# Patient Record
Sex: Female | Born: 1970 | Race: White | Hispanic: Yes | Marital: Married | Smoking: Current every day smoker
Health system: Southern US, Community
[De-identification: ages and names within clinical notes are randomized; demographics above are authoritative.]

## PROBLEM LIST (undated history)

## (undated) DIAGNOSIS — F39 Unspecified mood [affective] disorder: Secondary | ICD-10-CM

## (undated) DIAGNOSIS — D6800 Von Willebrand disease, unspecified: Secondary | ICD-10-CM

## (undated) DIAGNOSIS — F32A Depression, unspecified: Secondary | ICD-10-CM

## (undated) DIAGNOSIS — R112 Nausea with vomiting, unspecified: Secondary | ICD-10-CM

## (undated) DIAGNOSIS — D649 Anemia, unspecified: Secondary | ICD-10-CM

## (undated) DIAGNOSIS — F329 Major depressive disorder, single episode, unspecified: Secondary | ICD-10-CM

## (undated) DIAGNOSIS — Z9889 Other specified postprocedural states: Secondary | ICD-10-CM

## (undated) DIAGNOSIS — N920 Excessive and frequent menstruation with regular cycle: Secondary | ICD-10-CM

## (undated) HISTORY — PX: HYSTEROSCOPY WITH D & C: SHX1775

## (undated) HISTORY — PX: MULTIPLE TOOTH EXTRACTIONS: SHX2053

---

## 2000-01-22 HISTORY — PX: DILATION AND CURETTAGE OF UTERUS: SHX78

## 2004-01-22 HISTORY — PX: APPENDECTOMY: SHX54

## 2012-12-19 ENCOUNTER — Ambulatory Visit (INDEPENDENT_AMBULATORY_CARE_PROVIDER_SITE_OTHER): Payer: BC Managed Care – PPO | Admitting: Family Medicine

## 2012-12-19 VITALS — BP 118/72 | HR 90 | Temp 99.4°F | Resp 18 | Ht 62.5 in | Wt 176.0 lb

## 2012-12-19 DIAGNOSIS — R319 Hematuria, unspecified: Secondary | ICD-10-CM

## 2012-12-19 DIAGNOSIS — R350 Frequency of micturition: Secondary | ICD-10-CM

## 2012-12-19 DIAGNOSIS — N3090 Cystitis, unspecified without hematuria: Secondary | ICD-10-CM

## 2012-12-19 DIAGNOSIS — R300 Dysuria: Secondary | ICD-10-CM

## 2012-12-19 DIAGNOSIS — J329 Chronic sinusitis, unspecified: Secondary | ICD-10-CM

## 2012-12-19 LAB — POCT URINALYSIS DIPSTICK
Bilirubin, UA: NEGATIVE
Ketones, UA: NEGATIVE
Nitrite, UA: NEGATIVE
Spec Grav, UA: 1.015
Urobilinogen, UA: 0.2
pH, UA: 8.5

## 2012-12-19 LAB — POCT UA - MICROSCOPIC ONLY
Casts, Ur, LPF, POC: NEGATIVE
Crystals, Ur, HPF, POC: NEGATIVE
Yeast, UA: NEGATIVE

## 2012-12-19 MED ORDER — SULFAMETHOXAZOLE-TMP DS 800-160 MG PO TABS: 1.0000 | ORAL_TABLET | Freq: Two times a day (BID) | ORAL | Status: DC

## 2012-12-19 MED ORDER — PHENAZOPYRIDINE HCL 200 MG PO TABS: 200.0000 mg | ORAL_TABLET | Freq: Three times a day (TID) | ORAL | Status: DC | PRN

## 2012-12-19 NOTE — Patient Instructions (Signed)
Drink plenty of fluids  Take the Pyridium one pill 3 times a day as needed for bladder pain. Usually one to 2 days is sufficient  Take the sulfa one pill twice daily with food  Use the Flonase (fluticasone nose spray) that you have at home, 2 sprays each nostril twice daily for 2 days, then once daily  Take over-the-counter pain relievers as needed  Return if worse

## 2012-12-19 NOTE — Progress Notes (Signed)
Subjective:     Results for orders placed in visit on 12/19/12  POCT URINALYSIS DIPSTICK      Result Value Range   Color, UA yellowish pink     Clarity, UA cloucy     Glucose, UA neg     Bilirubin, UA neg     Ketones, UA neg     Spec Grav, UA 1.015     Blood, UA large     pH, UA 8.5     Protein, UA trace     Urobilinogen, UA 0.2     Nitrite, UA neg     Leukocytes, UA moderate (2+)    POCT UA - MICROSCOPIC ONLY      Result Value Range   WBC, Ur, HPF, POC 4-8 with large clusters     RBC, urine, microscopic tntc     Bacteria, U Microscopic neg     Mucus, UA neg     Epithelial cells, urine per micros 0-1     Crystals, Ur, HPF, POC neg     Casts, Ur, LPF, POC neg     Yeast, UA neg

## 2013-12-11 ENCOUNTER — Ambulatory Visit (INDEPENDENT_AMBULATORY_CARE_PROVIDER_SITE_OTHER): Payer: BC Managed Care – PPO | Admitting: Family Medicine

## 2013-12-11 VITALS — BP 124/84 | HR 86 | Temp 98.2°F | Resp 16 | Ht 62.25 in | Wt 168.0 lb

## 2013-12-11 DIAGNOSIS — Z862 Personal history of diseases of the blood and blood-forming organs and certain disorders involving the immune mechanism: Secondary | ICD-10-CM

## 2013-12-11 DIAGNOSIS — N946 Dysmenorrhea, unspecified: Secondary | ICD-10-CM

## 2013-12-11 DIAGNOSIS — R1032 Left lower quadrant pain: Secondary | ICD-10-CM

## 2013-12-11 DIAGNOSIS — R350 Frequency of micturition: Secondary | ICD-10-CM

## 2013-12-11 LAB — POCT UA - MICROSCOPIC ONLY
CRYSTALS, UR, HPF, POC: NEGATIVE
Casts, Ur, LPF, POC: NEGATIVE
Mucus, UA: NEGATIVE
Yeast, UA: NEGATIVE

## 2013-12-11 LAB — POCT CBC
Granulocyte percent: 56.7 %G (ref 37–80)
HCT, POC: 37.5 % — AB (ref 37.7–47.9)
Hemoglobin: 12.3 g/dL (ref 12.2–16.2)
LYMPH, POC: 2.6 (ref 0.6–3.4)
MCH, POC: 29.6 pg (ref 27–31.2)
MCHC: 32.8 g/dL (ref 31.8–35.4)
MCV: 90.3 fL (ref 80–97)
MID (CBC): 0.5 (ref 0–0.9)
MPV: 6.9 fL (ref 0–99.8)
PLATELET COUNT, POC: 320 10*3/uL (ref 142–424)
POC GRANULOCYTE: 4.1 (ref 2–6.9)
POC LYMPH PERCENT: 36.6 %L (ref 10–50)
POC MID %: 6.7 % (ref 0–12)
RBC: 4.15 M/uL (ref 4.04–5.48)
RDW, POC: 13.3 %
WBC: 7.2 10*3/uL (ref 4.6–10.2)

## 2013-12-11 LAB — POCT URINALYSIS DIPSTICK
BILIRUBIN UA: NEGATIVE
Glucose, UA: NEGATIVE
Ketones, UA: NEGATIVE
Leukocytes, UA: NEGATIVE
NITRITE UA: NEGATIVE
Protein, UA: NEGATIVE
RBC UA: NEGATIVE
Spec Grav, UA: 1.01
Urobilinogen, UA: 0.2
pH, UA: 7.5

## 2013-12-11 NOTE — Patient Instructions (Addendum)

## 2013-12-11 NOTE — Progress Notes (Signed)
Subjective: 43 year old lady who has been having problems since her last menstrual cycle. This was a couple of weeks ago. She had 2 days of severe pain with it, had to stay late out. She felt generally systemically lousy. She has been having some urinary frequency and dysuria. She has not had any dyspareunia. She has not had any problems with her bowels, no history of constipation. She hurts in her left lower quadrant of the abdomen and pelvis. She has a slight area of most pain just in the left pelvic area. She is not running fevers. The pain is not constant but comes and goes even since the menstrual cycle. Her menstrual cycles of gotten a little closer together over the years, used to be 4 weeks and are now down to about 3 weeks. She's been told that she was premenopausal. In [place] she was given some estrogen to take for a little while, but she did not contain a very long. She has had an appendectomy. She has no fear of STDs, with a long stable marriage.  History of von willebrands  Objective: No acute distress. Abdomen has normal bowel sounds. Soft without masses. Mild left lower quadrant tenderness. Pelvic normal external genitalia. Vaginal cuff is unremarkable. Cervix appears normal. No discharge. Bimanual exam reveals the uterus to be a little bit retroflexed. She has minimal tenderness in the left lower abdomen but I could not appreciate any masses   Results for orders placed or performed in visit on 12/11/13  POCT urinalysis dipstick  Result Value Ref Range   Color, UA yellow    Clarity, UA clear    Glucose, UA neg    Bilirubin, UA neg    Ketones, UA neg    Spec Grav, UA 1.010    Blood, UA neg    pH, UA 7.5    Protein, UA neg    Urobilinogen, UA 0.2    Nitrite, UA neg    Leukocytes, UA Negative   POCT UA - Microscopic Only  Result Value Ref Range   WBC, Ur, HPF, POC 0-2    RBC, urine, microscopic 0-1    Bacteria, U Microscopic trace    Mucus, UA neg    Epithelial cells, urine  per micros 0-2    Crystals, Ur, HPF, POC neg    Casts, Ur, LPF, POC neg    Yeast, UA neg   POCT CBC  Result Value Ref Range   WBC 7.2 4.6 - 10.2 K/uL   Lymph, poc 2.6 0.6 - 3.4   POC LYMPH PERCENT 36.6 10 - 50 %L   MID (cbc) 0.5 0 - 0.9   POC MID % 6.7 0 - 12 %M   POC Granulocyte 4.1 2 - 6.9   Granulocyte percent 56.7 37 - 80 %G   RBC 4.15 4.04 - 5.48 M/uL   Hemoglobin 12.3 12.2 - 16.2 g/dL   HCT, POC 37.5 (A) 37.7 - 47.9 %   MCV 90.3 80 - 97 fL   MCH, POC 29.6 27 - 31.2 pg   MCHC 32.8 31.8 - 35.4 g/dL   RDW, POC 13.3 %   Platelet Count, POC 320 142 - 424 K/uL   MPV 6.9 0 - 99.8 fL   Assessment: Left lower quadrant pain Dysmenorrhea  Plan: Schedule her for a pelvic ultrasound, hopefully Monday.

## 2013-12-13 ENCOUNTER — Telehealth: Payer: Self-pay | Admitting: *Deleted

## 2013-12-13 ENCOUNTER — Inpatient Hospital Stay: Admission: RE | Admit: 2013-12-13 | Payer: Self-pay | Source: Ambulatory Visit

## 2013-12-13 ENCOUNTER — Other Ambulatory Visit: Payer: Self-pay

## 2013-12-13 DIAGNOSIS — N946 Dysmenorrhea, unspecified: Secondary | ICD-10-CM

## 2013-12-13 DIAGNOSIS — N920 Excessive and frequent menstruation with regular cycle: Secondary | ICD-10-CM

## 2013-12-13 NOTE — Telephone Encounter (Signed)
[facility] called and needed the order to be added for US pelvic limited and a Transvaginal US to look at the bladder.

## 2014-01-07 ENCOUNTER — Other Ambulatory Visit: Payer: Self-pay

## 2014-01-11 ENCOUNTER — Encounter: Payer: Self-pay | Admitting: Family Medicine

## 2014-02-27 ENCOUNTER — Ambulatory Visit (INDEPENDENT_AMBULATORY_CARE_PROVIDER_SITE_OTHER): Payer: BLUE CROSS/BLUE SHIELD | Admitting: Physician Assistant

## 2014-02-27 VITALS — BP 116/76 | HR 85 | Temp 98.0°F | Resp 16 | Ht 62.25 in | Wt 171.0 lb

## 2014-02-27 DIAGNOSIS — J0100 Acute maxillary sinusitis, unspecified: Secondary | ICD-10-CM | POA: Insufficient documentation

## 2014-02-27 DIAGNOSIS — J309 Allergic rhinitis, unspecified: Secondary | ICD-10-CM

## 2014-02-27 MED ORDER — IPRATROPIUM BROMIDE 0.03 % NA SOLN: 2.0000 | Freq: Two times a day (BID) | NASAL | Status: DC

## 2014-02-27 MED ORDER — AMOXICILLIN 875 MG PO TABS: 875.0000 mg | ORAL_TABLET | Freq: Two times a day (BID) | ORAL | Status: DC

## 2014-02-27 MED ORDER — FLUTICASONE PROPIONATE 50 MCG/ACT NA SUSP: 2.0000 | Freq: Every day | NASAL | Status: DC

## 2014-02-27 NOTE — Progress Notes (Signed)
Subjective:    Patient ID: Cheryl Tate, female    DOB: Nov 20, 1970, 44 y.o.   MRN: [id number]  HPI  This is a 44 year old female presenting with sinus problems x 1 month. She reports she has had problems with her allergies over the past month. She has had congestion and her eyes have been watery and itchy. Last week she thought she was getting conjunctivitis but realized it was allergies when antihistamine drops cleared it up. Over the past 2 days she has started getting sinus pain and teeth pain. When she bends forward she feels pressure in her face. She is endorsing bilateral ear fullness. She has a dry cough. She is having yellow drainage from her nose, especially in the mornings. She has tried saline nasal spray without much relief. She states in the past flonase has worked really well for her allergic symptoms although she has not had flonase in over a year. She denies sore throat, fever or chills. She does not have a history of asthma. She smokes 1 cigarette per day (pt is from [place] and she states this is an [place] habit).  Review of Systems  Constitutional: Negative for fever and chills.  HENT: Positive for congestion and sinus pressure. Negative for ear pain and sore throat.   Eyes: Negative for redness.  Respiratory: Positive for cough. Negative for shortness of breath and wheezing.   Gastrointestinal: Negative for nausea and vomiting.  Skin: Negative for rash.  Allergic/Immunologic: Positive for environmental allergies.  Hematological: Negative for adenopathy.    There are no active problems to display for this patient.  Prior to Admission medications   Medication Sig Start Date End Date Taking? Authorizing Provider  fluticasone (FLONASE) 50 MCG/ACT nasal spray Place 2 sprays into both nostrils daily.   Yes Historical Provider, MD  valproic acid (DEPAKENE) 250 MG capsule Take 250 mg by mouth Nightly.   Yes Historical Provider, MD   No Known Allergies  Patient's social  and family history were reviewed.     Objective:   Physical Exam  Constitutional: She is oriented to person, place, and time. She appears well-developed and well-nourished. No distress.  HENT:  Head: Normocephalic and atraumatic.  Right Ear: Hearing, external ear and ear canal normal. Tympanic membrane is retracted.  Left Ear: Hearing, external ear and ear canal normal. Tympanic membrane is retracted.  Nose: Mucosal edema present. Right sinus exhibits maxillary sinus tenderness. Right sinus exhibits no frontal sinus tenderness. Left sinus exhibits maxillary sinus tenderness. Left sinus exhibits no frontal sinus tenderness.  Mouth/Throat: Uvula is midline and mucous membranes are normal. Posterior oropharyngeal erythema present. No oropharyngeal exudate or posterior oropharyngeal edema.  Eyes: Conjunctivae and lids are normal. Right eye exhibits no discharge. Left eye exhibits no discharge. No scleral icterus.  Cardiovascular: Normal rate, regular rhythm, normal heart sounds, intact distal pulses and normal pulses.   No murmur heard. Pulmonary/Chest: Effort normal and breath sounds normal. No respiratory distress. She has no wheezes. She has no rhonchi. She has no rales.  Musculoskeletal: Normal range of motion.  Lymphadenopathy:       Head (right side): No submental, no submandibular, no tonsillar, no preauricular and no posterior auricular adenopathy present.       Head (left side): No submental, no submandibular, no tonsillar, no preauricular and no posterior auricular adenopathy present.    She has cervical adenopathy.       Right cervical: No posterior cervical adenopathy present.      Left  cervical: Posterior cervical adenopathy present.  Neurological: She is alert and oriented to person, place, and time.  Skin: Skin is warm, dry and intact. No lesion and no rash noted.  Psychiatric: She has a normal mood and affect. Her speech is normal and behavior is normal. Thought content normal.     BP 116/76 mmHg  Pulse 85  Temp(Src) 98 F (36.7 C) (Oral)  Resp 16  Ht 5' 2.25" (1.581 m)  Wt 171 lb (77.565 kg)  BMI 31.03 kg/m2  SpO2 98%  LMP 02/08/2014     Assessment & Plan:  1. Allergic rhinitis, unspecified allergic rhinitis type 2. Acute maxillary sinusitis Will treat acute sinusitis with atrovent nasal spray and amoxicillin. In 2 weeks pt will stop atrovent and start flonase daily for allergic symptoms. She will return in 7-10 days if symptoms are not improving.  - ipratropium (ATROVENT) 0.03 % nasal spray; Place 2 sprays into both nostrils 2 (two) times daily.  Dispense: 30 mL; Refill: 0 - fluticasone (FLONASE) 50 MCG/ACT nasal spray; Place 2 sprays into both nostrils daily.  Dispense: 16 g; Refill: 11 - amoxicillin (AMOXIL) 875 MG tablet; Take 1 tablet (875 mg total) by mouth 2 (two) times daily.  Dispense: 20 tablet; Refill: 0   Benjaman Pott. Drenda Freeze, MHS [facility]  02/27/2014

## 2014-02-27 NOTE — Patient Instructions (Signed)
Use atrovent nasal spray twice a day for two weeks, then start using flonase nasal spray daily for allergies Take antibiotic until finished. Return if not getting better in 7-10 days.

## 2014-04-14 ENCOUNTER — Encounter: Payer: Self-pay | Admitting: Family Medicine

## 2014-06-11 ENCOUNTER — Ambulatory Visit (INDEPENDENT_AMBULATORY_CARE_PROVIDER_SITE_OTHER): Payer: BLUE CROSS/BLUE SHIELD | Admitting: Family Medicine

## 2014-06-11 VITALS — BP 118/72 | HR 84 | Temp 98.8°F | Resp 16 | Ht 64.0 in | Wt 169.0 lb

## 2014-06-11 DIAGNOSIS — X509XXA Other and unspecified overexertion or strenuous movements or postures, initial encounter: Secondary | ICD-10-CM

## 2014-06-11 DIAGNOSIS — N951 Menopausal and female climacteric states: Secondary | ICD-10-CM

## 2014-06-11 DIAGNOSIS — R42 Dizziness and giddiness: Secondary | ICD-10-CM

## 2014-06-11 DIAGNOSIS — T733XXA Exhaustion due to excessive exertion, initial encounter: Secondary | ICD-10-CM

## 2014-06-11 DIAGNOSIS — R5383 Other fatigue: Secondary | ICD-10-CM

## 2014-06-11 DIAGNOSIS — R51 Headache: Secondary | ICD-10-CM

## 2014-06-11 DIAGNOSIS — Z862 Personal history of diseases of the blood and blood-forming organs and certain disorders involving the immune mechanism: Secondary | ICD-10-CM | POA: Diagnosis not present

## 2014-06-11 DIAGNOSIS — Z8639 Personal history of other endocrine, nutritional and metabolic disease: Secondary | ICD-10-CM

## 2014-06-11 DIAGNOSIS — R519 Headache, unspecified: Secondary | ICD-10-CM

## 2014-06-11 LAB — POCT UA - MICROSCOPIC ONLY
Bacteria, U Microscopic: NEGATIVE
CASTS, UR, LPF, POC: NEGATIVE
Crystals, Ur, HPF, POC: NEGATIVE
MUCUS UA: NEGATIVE
Yeast, UA: NEGATIVE

## 2014-06-11 LAB — POCT URINALYSIS DIPSTICK
BILIRUBIN UA: NEGATIVE
GLUCOSE UA: NEGATIVE
KETONES UA: NEGATIVE
Leukocytes, UA: NEGATIVE
Nitrite, UA: NEGATIVE
Protein, UA: NEGATIVE
SPEC GRAV UA: 1.015
Urobilinogen, UA: 0.2
pH, UA: 8.5

## 2014-06-11 LAB — POCT CBC
GRANULOCYTE PERCENT: 59.6 % (ref 37–80)
HEMATOCRIT: 40.6 % (ref 37.7–47.9)
HEMOGLOBIN: 12.6 g/dL (ref 12.2–16.2)
Lymph, poc: 2 (ref 0.6–3.4)
MCH: 28.1 pg (ref 27–31.2)
MCHC: 31 g/dL — AB (ref 31.8–35.4)
MCV: 90.7 fL (ref 80–97)
MID (cbc): 0.3 (ref 0–0.9)
MPV: 7.5 fL (ref 0–99.8)
POC Granulocyte: 3.5 (ref 2–6.9)
POC LYMPH PERCENT: 34.7 %L (ref 10–50)
POC MID %: 5.7 %M (ref 0–12)
Platelet Count, POC: 305 10*3/uL (ref 142–424)
RBC: 4.47 M/uL (ref 4.04–5.48)
RDW, POC: 14.2 %
WBC: 5.8 10*3/uL (ref 4.6–10.2)

## 2014-06-11 LAB — POCT GLYCOSYLATED HEMOGLOBIN (HGB A1C): HEMOGLOBIN A1C: 5.1

## 2014-06-11 LAB — TSH: TSH: 1.289 u[IU]/mL (ref 0.350–4.500)

## 2014-06-11 NOTE — Progress Notes (Addendum)
Subjective: 44 year old lady who's been having problems with excessive fatigue over the past 3 weeks. Although she is busy and stressed, she usually is someone who can be on the go fine without any issues. She has a long history of episodes of iron deficiency anemia, and has been given IV aren't a number of times in [place]. She spends a lot of time in [place] and here. June 24 she is returning to [place] for several weeks, and has an appointment with a gynecologist here on June 23. She has not been getting a lot of regular exercise because she feels too fatigued. She has some headaches and some lightheaded dizziness. No major chest pains or excessive shortness of breath but just feels no energy. Nonspecific abdominal pains. She has a lot of premenstrual pain in her low back and lower abdomen. She feels like menstrual cycle is getting ready to come on again now. She finished a 9 day cycle recently, with normal being 4 days. She does not smoke or drink or use drugs. She is fairly content in her life. Has not had major anxiety and depression issues.  Objective: Pleasant lady, alert and oriented in no major distress. Her TMs are normal. Eyes PERRLA. Throat clear. Neck supple without nodes or thyromegaly. No carotid bruits. Chest is clear to auscultation. Heart regular without murmurs gallops or arrhythmias. Abdomen is soft without organomegaly or masses. Mild suprapubic tenderness but she needs to urinate. Extremities unremarkable.  Assessment: Allergy undetermined Dizziness History of iron deficiency anemia  Plan: Check basic labs and proceed from there  Results for orders placed or performed in visit on 06/11/14  POCT CBC  Result Value Ref Range   WBC 5.8 4.6 - 10.2 K/uL   Lymph, poc 2.0 0.6 - 3.4   POC LYMPH PERCENT 34.7 10 - 50 %L   MID (cbc) 0.3 0 - 0.9   POC MID % 5.7 0 - 12 %M   POC Granulocyte 3.5 2 - 6.9   Granulocyte percent 59.6 37 - 80 %G   RBC 4.47 4.04 - 5.48 M/uL   Hemoglobin 12.6 12.2  - 16.2 g/dL   HCT, POC 40.6 37.7 - 47.9 %   MCV 90.7 80 - 97 fL   MCH, POC 28.1 27 - 31.2 pg   MCHC 31.0 (A) 31.8 - 35.4 g/dL   RDW, POC 14.2 %   Platelet Count, POC 305 142 - 424 K/uL   MPV 7.5 0 - 99.8 fL  POCT urinalysis dipstick  Result Value Ref Range   Color, UA lt yellow    Clarity, UA cloudy    Glucose, UA neg    Bilirubin, UA neg    Ketones, UA neg    Spec Grav, UA 1.015    Blood, UA mod    pH, UA 8.5    Protein, UA neg    Urobilinogen, UA 0.2    Nitrite, UA neg    Leukocytes, UA Negative   POCT UA - Microscopic Only  Result Value Ref Range   WBC, Ur, HPF, POC 0-3    RBC, urine, microscopic 22-27    Bacteria, U Microscopic neg    Mucus, UA neg    Epithelial cells, urine per micros 0-4    Crystals, Ur, HPF, POC neg    Casts, Ur, LPF, POC neg    Yeast, UA neg   POCT glycosylated hemoglobin (Hb A1C)  Result Value Ref Range   Hemoglobin A1C 5.1     Nothing apparent turns  up. Will check an EKG. Advised patient to follow through with the gynecologic visit. I will see her back on an as-needed basis. She does not believe this is anxiety and depression related, but if she is not doing better I would consider trying an antidepressant on her. Advised pushing on with some regular physical activity despite not feeling well.  EKG normal  Patient requests that we send reports to Dr. cousins, her OB/GYN if we we remember to do so.

## 2014-06-11 NOTE — Patient Instructions (Signed)
Try to push her self to resume more regular exercise  I will let you know the results of your additional labs in a few days  See the gynecologist as planned. Call the office and see whether he take can offer you an earlier appointment if they have a cancellation.  If you continue to not feel stronger return for recheck either before or after going to [place]. Return at any time if something abruptly changes.  At some point we might consider giving you an antidepressant if nothing else is turning up. Emotional issues that are not apparent to somebody can be the cause of persistent fatigue.

## 2014-06-14 LAB — COMPLETE METABOLIC PANEL WITH GFR
ALT: 14 U/L (ref 0–35)
AST: 14 U/L (ref 0–37)
Albumin: 5 g/dL (ref 3.5–5.2)
Alkaline Phosphatase: 35 U/L — ABNORMAL LOW (ref 39–117)
BUN: 11 mg/dL (ref 6–23)
CALCIUM: 10.2 mg/dL (ref 8.4–10.5)
CHLORIDE: 100 meq/L (ref 96–112)
CO2: 23 mEq/L (ref 19–32)
CREATININE: 0.5 mg/dL (ref 0.50–1.10)
GFR, Est African American: >89 mL/min
GFR, Est Non African American: >89 mL/min
Glucose, Bld: 92 mg/dL (ref 70–99)
Potassium: 4.4 mEq/L (ref 3.5–5.3)
Sodium: 136 mEq/L (ref 135–145)
Total Bilirubin: 0.4 mg/dL (ref 0.2–1.2)
Total Protein: 7.7 g/dL (ref 6.0–8.3)

## 2014-06-14 LAB — IBC PANEL
%SAT: 29 % (ref 20–55)
TIBC: 396 ug/dL (ref 250–470)
UIBC: 283 ug/dL (ref 125–400)

## 2014-06-14 LAB — IRON: Iron: 113 ug/dL (ref 42–145)

## 2014-08-21 ENCOUNTER — Other Ambulatory Visit: Payer: Self-pay | Admitting: Family Medicine

## 2014-08-21 MED ORDER — OSELTAMIVIR PHOSPHATE 75 MG PO CAPS: 75.0000 mg | ORAL_CAPSULE | Freq: Every day | ORAL | Status: DC

## 2014-08-21 NOTE — Progress Notes (Signed)
She was recently had [place] She has been exposed to Flu A by husband

## 2014-08-29 ENCOUNTER — Ambulatory Visit (INDEPENDENT_AMBULATORY_CARE_PROVIDER_SITE_OTHER): Payer: BLUE CROSS/BLUE SHIELD | Admitting: Family Medicine

## 2014-08-29 VITALS — BP 108/62 | HR 80 | Temp 98.3°F | Resp 14 | Ht 63.0 in | Wt 167.0 lb

## 2014-08-29 DIAGNOSIS — R197 Diarrhea, unspecified: Secondary | ICD-10-CM

## 2014-08-29 DIAGNOSIS — R1032 Left lower quadrant pain: Secondary | ICD-10-CM

## 2014-08-29 DIAGNOSIS — R112 Nausea with vomiting, unspecified: Secondary | ICD-10-CM

## 2014-08-29 LAB — POCT CBC
Granulocyte percent: 61.8 %G (ref 37–80)
HCT, POC: 39.6 % (ref 37.7–47.9)
Hemoglobin: 12.5 g/dL (ref 12.2–16.2)
LYMPH, POC: 2.8 (ref 0.6–3.4)
MCH, POC: 28.1 pg (ref 27–31.2)
MCHC: 31.6 g/dL — AB (ref 31.8–35.4)
MCV: 89.1 fL (ref 80–97)
MID (cbc): 0.4 (ref 0–0.9)
MPV: 7.4 fL (ref 0–99.8)
POC Granulocyte: 5.3 (ref 2–6.9)
POC LYMPH %: 33.1 % (ref 10–50)
POC MID %: 5.1 %M (ref 0–12)
Platelet Count, POC: 368 10*3/uL (ref 142–424)
RBC: 4.45 M/uL (ref 4.04–5.48)
RDW, POC: 14.4 %
WBC: 8.6 10*3/uL (ref 4.6–10.2)

## 2014-08-29 LAB — POCT URINALYSIS DIPSTICK
Bilirubin, UA: NEGATIVE
Glucose, UA: NEGATIVE
KETONES UA: NEGATIVE
NITRITE UA: NEGATIVE
PH UA: 7
Protein, UA: NEGATIVE
SPEC GRAV UA: 1.015
Urobilinogen, UA: 0.2

## 2014-08-29 LAB — POCT UA - MICROSCOPIC ONLY
Bacteria, U Microscopic: NEGATIVE
Casts, Ur, LPF, POC: NEGATIVE
Crystals, Ur, HPF, POC: NEGATIVE
Mucus, UA: NEGATIVE
Yeast, UA: NEGATIVE

## 2014-08-29 NOTE — Progress Notes (Signed)
  Subjective:  Patient ID: Cheryl Tate, female    DOB: [birth year]  Age: 44 y.o. MRN: [id number]  44 y.o. lady who is been here in the past. She just came back from their 44 in [place]. She had been having problems with menorrhagia and had a transvaginal ultrasound and hysteroscopy call over there. Everything went well. She came back to [place]. She did have her menstrual cycle. She developed abdominal pain in the left lower quadrant about last Thursday which would be 4 days ago. Had nausea but only a tiny bit of vomiting. She has had loose bowel movements, one time watery yellow. These were only been once or twice a day. The pain has been pretty consistently in the left lower quadrant. No urinary symptoms. Has felt like she might be febrile but has not had any documented fevers.   Objective:   No major distress. Abdomen has soft bowel sounds with no tinkling or rushing. Not distended. Soft without organomegaly or masses. Mild tenderness actually just to the right of midline right now, though she says it usually is more to the left that where the tenderness occurs. She has a full bladder right now.  Assessment & Plan:   Assessment:  Left lower quadrant abdominal pain Diarrhea Nausea  Plan:  CBC and urinalysis  Results for orders placed or performed in visit on 08/29/14  POCT CBC  Result Value Ref Range   WBC 8.6 4.6 - 10.2 K/uL   Lymph, poc 2.8 0.6 - 3.4   POC LYMPH PERCENT 33.1 10 - 50 %L   MID (cbc) 0.4 0 - 0.9   POC MID % 5.1 0 - 12 %M   POC Granulocyte 5.3 2 - 6.9   Granulocyte percent 61.8 37 - 80 %G   RBC 4.45 4.04 - 5.48 M/uL   Hemoglobin 12.5 12.2 - 16.2 g/dL   HCT, POC 39.6 37.7 - 47.9 %   MCV 89.1 80 - 97 fL   MCH, POC 28.1 27 - 31.2 pg   MCHC 31.6 (A) 31.8 - 35.4 g/dL   RDW, POC 14.4 %   Platelet Count, POC 368 142 - 424 K/uL   MPV 7.4 0 - 99.8 fL  POCT UA - Microscopic Only  Result Value Ref Range   WBC, Ur, HPF, POC 2-6    RBC, urine, microscopic  0-1    Bacteria, U Microscopic neg    Mucus, UA neg    Epithelial cells, urine per micros 6-10    Crystals, Ur, HPF, POC neg    Casts, Ur, LPF, POC neg    Yeast, UA neg   POCT urinalysis dipstick  Result Value Ref Range   Color, UA yellow    Clarity, UA clear    Glucose, UA neg    Bilirubin, UA neg    Ketones, UA neg    Spec Grav, UA 1.015    Blood, UA small    pH, UA 7.0    Protein, UA neg    Urobilinogen, UA 0.2    Nitrite, UA neg    Leukocytes, UA large (3+) (A) Negative     There are no Patient Instructions on file for this visit.   HOPPER,DAVID, MD 08/29/2014

## 2014-08-29 NOTE — Patient Instructions (Signed)
Avoid extremely rich or spicy foods for the next few days  Take Tylenol 500 mg 2 pills 3 times daily maximum for pain  If the bowels continue loose try taking some Pepto-Bismol or Imodium  If running fever, developing more significant abdominal pain, or unable to keep things down please return. If not improving over the next few days we may need to recheck your anyhow.

## 2014-10-06 ENCOUNTER — Ambulatory Visit (INDEPENDENT_AMBULATORY_CARE_PROVIDER_SITE_OTHER): Payer: BLUE CROSS/BLUE SHIELD | Admitting: Family Medicine

## 2014-10-06 VITALS — BP 116/72 | HR 79 | Temp 99.5°F | Resp 18 | Ht 63.0 in | Wt 166.2 lb

## 2014-10-06 DIAGNOSIS — R5383 Other fatigue: Secondary | ICD-10-CM | POA: Diagnosis not present

## 2014-10-06 DIAGNOSIS — D649 Anemia, unspecified: Secondary | ICD-10-CM | POA: Diagnosis not present

## 2014-10-06 DIAGNOSIS — R1084 Generalized abdominal pain: Secondary | ICD-10-CM

## 2014-10-06 DIAGNOSIS — R591 Generalized enlarged lymph nodes: Secondary | ICD-10-CM | POA: Diagnosis not present

## 2014-10-06 LAB — POCT CBC
Granulocyte percent: 54.3 %G (ref 37–80)
HCT, POC: 36.5 % — AB (ref 37.7–47.9)
Hemoglobin: 11.1 g/dL — AB (ref 12.2–16.2)
LYMPH, POC: 2.9 (ref 0.6–3.4)
MCH, POC: 27.9 pg (ref 27–31.2)
MCHC: 30.5 g/dL — AB (ref 31.8–35.4)
MCV: 91.2 fL (ref 80–97)
MID (CBC): 0.4 (ref 0–0.9)
MPV: 7.2 fL (ref 0–99.8)
POC Granulocyte: 4 (ref 2–6.9)
POC LYMPH %: 40.1 % (ref 10–50)
POC MID %: 5.6 %M (ref 0–12)
Platelet Count, POC: 372 10*3/uL (ref 142–424)
RBC: 4 M/uL — AB (ref 4.04–5.48)
RDW, POC: 16.5 %
WBC: 7.3 10*3/uL (ref 4.6–10.2)

## 2014-10-06 LAB — POCT URINALYSIS DIPSTICK
Bilirubin, UA: NEGATIVE
GLUCOSE UA: NEGATIVE
Ketones, UA: NEGATIVE
LEUKOCYTES UA: NEGATIVE
NITRITE UA: NEGATIVE
Protein, UA: NEGATIVE
Spec Grav, UA: 1.015
UROBILINOGEN UA: 0.2
pH, UA: 7

## 2014-10-06 LAB — COMPLETE METABOLIC PANEL WITH GFR
ALBUMIN: 4.8 g/dL (ref 3.6–5.1)
ALK PHOS: 34 U/L (ref 33–115)
ALT: 11 U/L (ref 6–29)
AST: 13 U/L (ref 10–30)
BILIRUBIN TOTAL: 0.4 mg/dL (ref 0.2–1.2)
BUN: 13 mg/dL (ref 7–25)
CO2: 24 mmol/L (ref 20–31)
Calcium: 9.7 mg/dL (ref 8.6–10.2)
Chloride: 105 mmol/L (ref 98–110)
Creat: 0.47 mg/dL — ABNORMAL LOW (ref 0.50–1.10)
GFR, Est African American: >89 mL/min (ref >=60)
GFR, Est Non African American: >89 mL/min (ref >=60)
GLUCOSE: 88 mg/dL (ref 65–99)
Potassium: 4.5 mmol/L (ref 3.5–5.3)
SODIUM: 138 mmol/L (ref 135–146)
TOTAL PROTEIN: 7.4 g/dL (ref 6.1–8.1)

## 2014-10-06 LAB — POCT UA - MICROSCOPIC ONLY
BACTERIA, U MICROSCOPIC: NEGATIVE
Casts, Ur, LPF, POC: NEGATIVE
Crystals, Ur, HPF, POC: NEGATIVE
Mucus, UA: NEGATIVE
Yeast, UA: NEGATIVE

## 2014-10-06 NOTE — Addendum Note (Signed)
Addended by: Orion Crook on: 10/06/2014 05:32 PM   Modules accepted: Orders

## 2014-10-06 NOTE — Patient Instructions (Addendum)
We will schedule you for a CT scan of the abdomen.  No new treatments until then  Continue iron for the anemia  Keep your appointment with the OB/GYN.

## 2014-10-06 NOTE — Progress Notes (Addendum)
Abdominal pain and fatigue Subjective:  Patient ID: Cheryl Tate, female    DOB: 06/23/1970  Age: 44 y.o. MRN: [id number]  44 year old lady who I saw last month. She is continued to not feel good. She feels fatigued and ill. She has nausea but no vomiting. She had a Depo-Provera shot because of abnormal bleeding. She has had some spotting since then. She has an appointment with a different OB/GYN next week for a second opinion. She hasn't had the hysteroscopy and D&C in [place] in June. She has felt full in her breasts, but feels like that may have been from the Depo-Provera shot. She has felt like her glands in her neck and axilla and groin was swelling. She has felt a little febrile.   Objective:   No major acute distress. Throat clear. Neck supple. Small nodes but really nothing major palpated this time. No axillary or significant inguinal nodes. Her abdomen has normal bowel sounds. Soft without organomegaly, masses. She does have some generalized tenderness across the right upper quadrant and across the entire lower abdomen. It is not point specific.  Assessment & Plan:   Assessment:  Persistent malaise and fatigue Persistent nonspecific abdominal pains History of dysfunctional uterine bleeding Subjective lymphadenopathy comes and goes  Plan:  Check labs on her. We'll then decide if I need to get a CT scan of the abdomen.  Results for orders placed or performed in visit on 10/06/14  POCT CBC  Result Value Ref Range   WBC 7.3 4.6 - 10.2 K/uL   Lymph, poc 2.9 0.6 - 3.4   POC LYMPH PERCENT 40.1 10 - 50 %L   MID (cbc) 0.4 0 - 0.9   POC MID % 5.6 0 - 12 %M   POC Granulocyte 4.0 2 - 6.9   Granulocyte percent 54.3 37 - 80 %G   RBC 4.00 (A) 4.04 - 5.48 M/uL   Hemoglobin 11.1 (A) 12.2 - 16.2 g/dL   HCT, POC 36.5 (A) 37.7 - 47.9 %   MCV 91.2 80 - 97 fL   MCH, POC 27.9 27 - 31.2 pg   MCHC 30.5 (A) 31.8 - 35.4 g/dL   RDW, POC 16.5 %   Platelet Count, POC 372 142 - 424 K/uL   MPV  7.2 0 - 99.8 fL  POCT UA - Microscopic Only  Result Value Ref Range   WBC, Ur, HPF, POC 0-1    RBC, urine, microscopic 20-25    Bacteria, U Microscopic neg    Mucus, UA neg    Epithelial cells, urine per micros 0-1    Crystals, Ur, HPF, POC neg    Casts, Ur, LPF, POC neg    Yeast, UA neg    Amorphous moderate   POCT urinalysis dipstick  Result Value Ref Range   Color, UA yellow    Clarity, UA hazy    Glucose, UA neg    Bilirubin, UA neg    Ketones, UA neg    Spec Grav, UA 1.015    Blood, UA moderate    pH, UA 7.0    Protein, UA neg    Urobilinogen, UA 0.2    Nitrite, UA neg    Leukocytes, UA Negative Negative   We will go ahead and schedule her for a CT scan of the abdomen. . I don't want to miss any ovarian or other pathology.  Patient Instructions  We will schedule you for a CT scan of the abdomen.   5 PM:  patient returns  because she has been having more pain since he went home. She is very anxious. Abdomen reexamined. Mild generalized tenderness. We will try to obtain a CT scan to get it tomorrow.   Emmogene Simson, MD 10/06/2014

## 2014-10-07 ENCOUNTER — Ambulatory Visit
Admission: RE | Admit: 2014-10-07 | Discharge: 2014-10-07 | Disposition: A | Discharge status: Home / Self Care | Payer: BLUE CROSS/BLUE SHIELD | Source: Ambulatory Visit | Attending: Family Medicine | Admitting: Family Medicine

## 2014-10-07 DIAGNOSIS — R591 Generalized enlarged lymph nodes: Secondary | ICD-10-CM

## 2014-10-07 DIAGNOSIS — R1084 Generalized abdominal pain: Secondary | ICD-10-CM

## 2014-10-07 MED ORDER — IOPAMIDOL (ISOVUE-300) INJECTION 61%
100.0000 mL | Freq: Once | INTRAVENOUS | Status: AC | PRN
  Administered 2014-10-07: 100 mL via INTRAVENOUS

## 2014-10-10 ENCOUNTER — Encounter: Payer: Self-pay | Admitting: Family Medicine

## 2014-11-10 NOTE — H&P (Signed)
  44 year old G 4 P 3 with menorrhagia. She has a history of Von Willebrand's Disease and a history of postpartum hemorrhage.  She has had some issues with menorrhagia and for the past 9 months has been very heavy.  When she was in [place] she had an emergent hysteroscopy D and C this summer. The pathology was benign .  She has been treated with Depo Provera without improvement .   Ultrasound in our office shows a retroflexed uterus and questionable adenomyosis.    Past Medical History  Diagnosis Date  . Clotting disorder    Past Surgical History  Procedure Laterality Date  . Hysteroscopy w/d&c     Prior to Admission medications   Medication Sig Start Date End Date Taking? Authorizing Provider  medroxyPROGESTERone (DEPO-PROVERA) 150 MG/ML injection Inject 150 mg into the muscle every 3 (three) months.    Historical Provider, MD  valproic acid (DEPAKENE) 250 MG capsule Take 250 mg by mouth Nightly.    Historical Provider, MD   Allergies: Review of patient's allergies indicates no known allergies.  Family History  Problem Relation Age of Onset  . Mental retardation Mother   . Mental retardation Brother   . Mental retardation Maternal Grandmother    Social History   Social History  . Marital Status: Married    Spouse Name: N/A  . Number of Children: N/A  . Years of Education: N/A   Occupational History  . Not on file.   Social History Main Topics  . Smoking status: Current Every Day Smoker  . Smokeless tobacco: Not on file  . Alcohol Use: Not on file  . Drug Use: Not on file  . Sexual Activity: Not on file   Other Topics Concern  . Not on file   Social History Narrative   There were no vitals taken for this visit. General alert and oriented Lung CTAB Car RRR Abdomen is soft and non tender  IMPRESSION: Menorrhagia Von Willebrand's Disease  PLAN: Hsc, D and C, HTA Consent signed Risks reviewed

## 2014-11-15 ENCOUNTER — Encounter (HOSPITAL_BASED_OUTPATIENT_CLINIC_OR_DEPARTMENT_OTHER): Payer: Self-pay | Admitting: *Deleted

## 2014-11-15 NOTE — Progress Notes (Signed)
NPO AFTER MN.  ARRIVE AT 0600.  GETTING LAB WORK DONE Monday 11-21-2014 AT 1330.

## 2014-11-21 DIAGNOSIS — N920 Excessive and frequent menstruation with regular cycle: Secondary | ICD-10-CM | POA: Diagnosis not present

## 2014-11-21 DIAGNOSIS — F17210 Nicotine dependence, cigarettes, uncomplicated: Secondary | ICD-10-CM | POA: Diagnosis not present

## 2014-11-21 DIAGNOSIS — D680 Von Willebrand's disease: Secondary | ICD-10-CM | POA: Diagnosis not present

## 2014-11-21 LAB — CBC
HEMATOCRIT: 36.9 % (ref 36.0–46.0)
Hemoglobin: 12.2 g/dL (ref 12.0–15.0)
MCH: 30.4 pg (ref 26.0–34.0)
MCHC: 33.1 g/dL (ref 30.0–36.0)
MCV: 92 fL (ref 78.0–100.0)
PLATELETS: 295 10*3/uL (ref 150–400)
RBC: 4.01 MIL/uL (ref 3.87–5.11)
RDW: 13.4 % (ref 11.5–15.5)
WBC: 8.2 10*3/uL (ref 4.0–10.5)

## 2014-11-22 ENCOUNTER — Ambulatory Visit (HOSPITAL_BASED_OUTPATIENT_CLINIC_OR_DEPARTMENT_OTHER): Payer: BLUE CROSS/BLUE SHIELD | Admitting: Anesthesiology

## 2014-11-22 ENCOUNTER — Ambulatory Visit (HOSPITAL_BASED_OUTPATIENT_CLINIC_OR_DEPARTMENT_OTHER)
Admission: RE | Admit: 2014-11-22 | Discharge: 2014-11-22 | Disposition: A | Discharge status: Home / Self Care | Payer: BLUE CROSS/BLUE SHIELD | Source: Ambulatory Visit | Attending: Obstetrics and Gynecology | Admitting: Obstetrics and Gynecology

## 2014-11-22 ENCOUNTER — Encounter (HOSPITAL_BASED_OUTPATIENT_CLINIC_OR_DEPARTMENT_OTHER): Payer: Self-pay | Admitting: *Deleted

## 2014-11-22 ENCOUNTER — Encounter (HOSPITAL_BASED_OUTPATIENT_CLINIC_OR_DEPARTMENT_OTHER)
Admission: RE | Disposition: A | Discharge status: Home / Self Care | Payer: Self-pay | Source: Ambulatory Visit | Attending: Obstetrics and Gynecology

## 2014-11-22 DIAGNOSIS — N920 Excessive and frequent menstruation with regular cycle: Secondary | ICD-10-CM | POA: Insufficient documentation

## 2014-11-22 DIAGNOSIS — F17210 Nicotine dependence, cigarettes, uncomplicated: Secondary | ICD-10-CM | POA: Insufficient documentation

## 2014-11-22 DIAGNOSIS — D680 Von Willebrand's disease: Secondary | ICD-10-CM | POA: Insufficient documentation

## 2014-11-22 HISTORY — PX: DILITATION & CURRETTAGE/HYSTROSCOPY WITH HYDROTHERMAL ABLATION: SHX5570

## 2014-11-22 HISTORY — DX: Anemia, unspecified: D64.9

## 2014-11-22 HISTORY — DX: Von Willebrand's disease: D68.0

## 2014-11-22 HISTORY — DX: Excessive and frequent menstruation with regular cycle: N92.0

## 2014-11-22 HISTORY — DX: Major depressive disorder, single episode, unspecified: F32.9

## 2014-11-22 HISTORY — DX: Depression, unspecified: F32.A

## 2014-11-22 HISTORY — DX: Von Willebrand disease, unspecified: D68.00

## 2014-11-22 HISTORY — DX: Unspecified mood (affective) disorder: F39

## 2014-11-22 LAB — TYPE AND SCREEN
ABO/RH(D): O POS
Antibody Screen: NEGATIVE

## 2014-11-22 LAB — ABO/RH: ABO/RH(D): O POS

## 2014-11-22 LAB — POCT PREGNANCY, URINE: Preg Test, Ur: NEGATIVE

## 2014-11-22 SURGERY — DILATATION & CURETTAGE/HYSTEROSCOPY WITH HYDROTHERMAL ABLATION
Anesthesia: Monitor Anesthesia Care | Site: Uterus

## 2014-11-22 MED ORDER — PROMETHAZINE HCL 25 MG/ML IJ SOLN
6.2500 mg | INTRAMUSCULAR | Status: DC | PRN
  Filled 2014-11-22: qty 1

## 2014-11-22 MED ORDER — LIDOCAINE HCL 1 % IJ SOLN
INTRAMUSCULAR | Status: DC | PRN
  Administered 2014-11-22: 20 mL

## 2014-11-22 MED ORDER — MIDAZOLAM HCL 2 MG/2ML IJ SOLN
INTRAMUSCULAR | Status: AC
  Filled 2014-11-22: qty 2

## 2014-11-22 MED ORDER — ONDANSETRON HCL 4 MG/2ML IJ SOLN
INTRAMUSCULAR | Status: DC | PRN
  Administered 2014-11-22: 4 mg via INTRAVENOUS

## 2014-11-22 MED ORDER — LACTATED RINGERS IV SOLN
INTRAVENOUS | Status: DC
  Administered 2014-11-22: 07:00:00 via INTRAVENOUS
  Filled 2014-11-22: qty 1000

## 2014-11-22 MED ORDER — LIDOCAINE HCL (CARDIAC) 20 MG/ML IV SOLN
INTRAVENOUS | Status: DC | PRN
  Administered 2014-11-22: 50 mg via INTRAVENOUS

## 2014-11-22 MED ORDER — MIDAZOLAM HCL 5 MG/5ML IJ SOLN
INTRAMUSCULAR | Status: DC | PRN
  Administered 2014-11-22: 1 mg via INTRAVENOUS

## 2014-11-22 MED ORDER — FENTANYL CITRATE (PF) 100 MCG/2ML IJ SOLN
INTRAMUSCULAR | Status: AC
  Filled 2014-11-22: qty 4

## 2014-11-22 MED ORDER — KETAMINE HCL 50 MG/ML IJ SOLN
INTRAMUSCULAR | Status: AC
  Filled 2014-11-22: qty 10

## 2014-11-22 MED ORDER — CEFAZOLIN SODIUM-DEXTROSE 2-3 GM-% IV SOLR
INTRAVENOUS | Status: AC
  Filled 2014-11-22: qty 50

## 2014-11-22 MED ORDER — SODIUM CHLORIDE 0.9 % IR SOLN
Status: DC | PRN
  Administered 2014-11-22: 3000 mL

## 2014-11-22 MED ORDER — CEFAZOLIN SODIUM-DEXTROSE 2-3 GM-% IV SOLR
2.0000 g | INTRAVENOUS | Status: AC
  Administered 2014-11-22: 2 g via INTRAVENOUS
  Filled 2014-11-22: qty 50

## 2014-11-22 MED ORDER — OXYCODONE HCL 5 MG PO TABS: 5.0000 mg | ORAL_TABLET | ORAL | Status: DC | PRN

## 2014-11-22 MED ORDER — 0.9 % SODIUM CHLORIDE (POUR BTL) OPTIME
TOPICAL | Status: DC | PRN
  Administered 2014-11-22: 500 mL

## 2014-11-22 MED ORDER — FENTANYL CITRATE (PF) 100 MCG/2ML IJ SOLN
INTRAMUSCULAR | Status: DC | PRN
  Administered 2014-11-22: 25 ug via INTRAVENOUS
  Administered 2014-11-22: 50 ug via INTRAVENOUS
  Administered 2014-11-22: 25 ug via INTRAVENOUS

## 2014-11-22 MED ORDER — SODIUM CHLORIDE 0.9 % IV SOLN
250.0000 mg | INTRAVENOUS | Status: DC | PRN
  Administered 2014-11-22: 5 ug/kg/min via INTRAVENOUS

## 2014-11-22 MED ORDER — OXYCODONE HCL 5 MG PO TABS
ORAL_TABLET | ORAL | Status: AC
  Filled 2014-11-22: qty 1

## 2014-11-22 MED ORDER — DEXAMETHASONE SODIUM PHOSPHATE 4 MG/ML IJ SOLN
INTRAMUSCULAR | Status: DC | PRN
  Administered 2014-11-22: 10 mg via INTRAVENOUS

## 2014-11-22 MED ORDER — HYDROMORPHONE HCL 1 MG/ML IJ SOLN
0.2500 mg | INTRAMUSCULAR | Status: DC | PRN
  Filled 2014-11-22: qty 1

## 2014-11-22 MED ORDER — OXYCODONE HCL 5 MG PO TABS
5.0000 mg | ORAL_TABLET | Freq: Once | ORAL | Status: AC
  Administered 2014-11-22: 5 mg via ORAL
  Filled 2014-11-22: qty 1

## 2014-11-22 MED ORDER — PROPOFOL 500 MG/50ML IV EMUL
INTRAVENOUS | Status: DC | PRN
  Administered 2014-11-22: 200 ug/kg/min via INTRAVENOUS

## 2014-11-22 SURGICAL SUPPLY — 26 items
CATH ROBINSON RED A/P 16FR (CATHETERS) ×2 IMPLANT
COVER BACK TABLE 60X90IN (DRAPES) ×2 IMPLANT
DRAPE HYSTEROSCOPY (DRAPE) ×2 IMPLANT
DRAPE LG THREE QUARTER DISP (DRAPES) ×2 IMPLANT
DRSG TELFA 3X8 NADH (GAUZE/BANDAGES/DRESSINGS) ×2 IMPLANT
ELECT REM PT RETURN 9FT ADLT (ELECTROSURGICAL)
ELECTRODE REM PT RTRN 9FT ADLT (ELECTROSURGICAL) IMPLANT
GLOVE BIO SURGEON STRL SZ 6.5 (GLOVE) ×4 IMPLANT
GOWN STRL REUS W/ TWL LRG LVL3 (GOWN DISPOSABLE) ×2 IMPLANT
GOWN STRL REUS W/TWL LRG LVL3 (GOWN DISPOSABLE) ×2
IV NS IRRIG 3000ML ARTHROMATIC (IV SOLUTION) ×2 IMPLANT
KIT ROOM TURNOVER WOR (KITS) ×2 IMPLANT
LEGGING LITHOTOMY PAIR STRL (DRAPES) ×2 IMPLANT
LOOP ANGLED CUTTING 22FR (CUTTING LOOP) IMPLANT
MANIFOLD NEPTUNE II (INSTRUMENTS) IMPLANT
NEEDLE SPNL 25GX3.5 QUINCKE BL (NEEDLE) ×2 IMPLANT
NS IRRIG 500ML POUR BTL (IV SOLUTION) ×2 IMPLANT
PACK BASIN DAY SURGERY FS (CUSTOM PROCEDURE TRAY) ×2 IMPLANT
SET GENESYS HTA PROCERVA (MISCELLANEOUS) ×2 IMPLANT
SYR CONTROL 10ML LL (SYRINGE) ×2 IMPLANT
TOWEL OR 17X24 6PK STRL BLUE (TOWEL DISPOSABLE) ×4 IMPLANT
TRAY DSU PREP LF (CUSTOM PROCEDURE TRAY) ×2 IMPLANT
TUBE CONNECTING 12X1/4 (SUCTIONS) IMPLANT
TUBING AQUILEX INFLOW (TUBING) IMPLANT
TUBING AQUILEX OUTFLOW (TUBING) IMPLANT
WATER STERILE IRR 500ML POUR (IV SOLUTION) IMPLANT

## 2014-11-22 NOTE — Progress Notes (Signed)
H and P on the chart  History of Von Willebrand's diagnosed in [place] reviewed with Patient Cheryl Tate proceed with Hysteroscopy and D and C and HTA under local and IV sedation I do not feel DDAVP is necessary given she has never received it and this is a minor procedure Risks reviewed Consent signed

## 2014-11-22 NOTE — Anesthesia Preprocedure Evaluation (Addendum)
Anesthesia Evaluation  Patient identified by MRN, date of birth, ID band Patient awake    Reviewed: Allergy & Precautions, NPO status , Patient's Chart, lab work & pertinent test results  History of Anesthesia Complications Negative for: history of anesthetic complications  Airway Mallampati: I       Dental  (+) Teeth Intact   Pulmonary Current Smoker,    breath sounds clear to auscultation       Cardiovascular negative cardio ROS   Rhythm:Regular Rate:Normal     Neuro/Psych    GI/Hepatic negative GI ROS, Neg liver ROS,   Endo/Other  negative endocrine ROS  Renal/GU negative Renal ROS     Musculoskeletal   Abdominal   Peds  Hematology  (+) Blood dyscrasia, , Von Willebrand disorder but has not [place] hematologist [place]. Dx was in [place] Does have heavy periods and occassional nosebleed   Anesthesia Other Findings   Reproductive/Obstetrics                           Anesthesia Physical Anesthesia Plan  ASA: II  Anesthesia Plan: MAC   Post-op Pain Management:    Induction: Intravenous  Airway Management Planned: Natural Airway and Simple Face Mask  Additional Equipment:   Intra-op Plan:   Post-operative Plan: Extubation in OR  Informed Consent: I have reviewed the patients History and Physical, chart, labs and discussed the procedure including the risks, benefits and alternatives for the proposed anesthesia with the patient or authorized representative who has indicated his/her understanding and acceptance.   Dental advisory given  Plan Discussed with: CRNA and Surgeon  Anesthesia Plan Comments:        Anesthesia Quick Evaluation

## 2014-11-22 NOTE — Anesthesia Procedure Notes (Signed)
Procedure Name: MAC Date/Time: 11/22/2014 7:42 AM Performed by: Wanita Chamberlain Pre-anesthesia Checklist: Patient identified, Timeout performed, Emergency Drugs available, Suction available and Patient being monitored Patient Re-evaluated:Patient Re-evaluated prior to inductionOxygen Delivery Method: Nasal cannula Intubation Type: IV induction

## 2014-11-22 NOTE — Addendum Note (Signed)
Addendum  created 11/22/14 1050 by Wanita Chamberlain, CRNA   Modules edited: Anesthesia Medication Administration

## 2014-11-22 NOTE — Brief Op Note (Signed)
11/22/2014  8:24 AM  PATIENT:  Cheryl Tate  44 y.o. female  PRE-OPERATIVE DIAGNOSIS:  MENORRHAGIA, von willebrand's disease  POST-OPERATIVE DIAGNOSIS:  MENORRHAGIA, Von Willebrand's Disease  PROCEDURE:  Procedure(s): DILATATION & CURETTAGE/HYSTEROSCOPY WITH HYDROTHERMAL ABLATION (N/A)  SURGEON:  Surgeon(s) and Role:    * Dian Queen, MD - Primary  PHYSICIAN ASSISTANT:   ASSISTANTS: none   ANESTHESIA:   IV sedation and paracervical block  EBL:  Total I/O In: 850 [I.V.:850] Out: -   BLOOD ADMINISTERED:none  DRAINS: none   LOCAL MEDICATIONS USED:  LIDOCAINE   SPECIMEN:  No Specimen  DISPOSITION OF SPECIMEN:  N/A  COUNTS:  YES  TOURNIQUET:  * No tourniquets in log *  DICTATION: .Other Dictation: Dictation Number [id number]  PLAN OF CARE: Discharge to home after PACU  PATIENT DISPOSITION:  PACU - hemodynamically stable.   Delay start of Pharmacological VTE agent (>24hrs) due to surgical blood loss or risk of bleeding: not applicable

## 2014-11-22 NOTE — Transfer of Care (Signed)
Immediate Anesthesia Transfer of Care Note  Patient: Cheryl Tate  Procedure(s) Performed: Procedure(s): DILATATION & CURETTAGE/HYSTEROSCOPY WITH HYDROTHERMAL ABLATION (N/A)  Patient Location: PACU  Anesthesia Type:MAC  Level of Consciousness: awake, alert , oriented and patient cooperative  Airway & Oxygen Therapy: Patient Spontanous Breathing and Patient connected to nasal cannula oxygen  Post-op Assessment: Report given to RN and Post -op Vital signs reviewed and stable  Post vital signs: Reviewed and stable  Last Vitals:  Filed Vitals:   11/22/14 0628  BP: 110/61  Pulse: 71  Temp: 37.1 C  Resp: 16    Complications: No apparent anesthesia complications

## 2014-11-22 NOTE — Discharge Instructions (Signed)
° °  D & C Home care Instructions:   Personal hygiene:  Used sanitary napkins for vaginal drainage not tampons. Shower or tub bathe the day after your procedure. No douching until bleeding stops. Always wipe from front to back after  Elimination.  Activity: Do not drive or operate any equipment today. The effects of the anesthesia are still present and drowsiness may result. Rest today, not necessarily flat bed rest, just take it easy. You may resume your normal activity in one to 2 days.  Sexual activity: No intercourse for one week or as indicated by your physician  Diet: Eat a light diet as desired this evening. You may resume a regular diet tomorrow.  Return to work: One to 2 days.  General Expectations of your surgery: Vaginal bleeding should be no heavier than a normal period. Spotting may continue up to 10 days. Mild cramps may continue for a couple of days. You may have a regular period in 2-6 weeks.  Unexpected observations call your doctor if these occur: persistent or heavy bleeding. Severe abdominal cramping or pain. Elevation of temperature greater than 100F.  Call for an appointment in one week.    Patient's Signature_______________________________________________________  Nurse's Signature________________________________________________________   Post Anesthesia Home Care Instructions   Activity: Get plenty of rest for the remainder of the day. A responsible adult should stay with you for 24 hours following the procedure.  For the next 24 hours, DO NOT: -Drive a car -Paediatric nurse -Drink alcoholic beverages -Take any medication unless instructed by your physician -Make any legal decisions or sign important papers.  Meals: Start with liquid foods such as gelatin or soup. Progress to regular foods as tolerated. Avoid greasy, spicy, heavy foods. If nausea and/or vomiting occur, drink only clear liquids until the nausea and/or vomiting subsides. Call your  physician if vomiting continues.  Special Instructions/Symptoms: Your throat may feel dry or sore from the anesthesia or the breathing tube placed in your throat during surgery. If this causes discomfort, gargle with warm salt water. The discomfort should disappear within 24 hours.  If you had a scopolamine patch placed behind your ear for the management of post- operative nausea and/or vomiting:  1. The medication in the patch is effective for 72 hours, after which it should be removed.  Wrap patch in a tissue and discard in the trash. Wash hands thoroughly with soap and water. 2. You may remove the patch earlier than 72 hours if you experience unpleasant side effects which may include dry mouth, dizziness or visual disturbances. 3. Avoid touching the patch. Wash your hands with soap and water after contact with the patch.

## 2014-11-22 NOTE — Anesthesia Postprocedure Evaluation (Signed)
  Anesthesia Post-op Note  Patient: Cheryl Tate  Procedure(s) Performed: Procedure(s): HYSTEROSCOPY WITH HYDROTHERMAL ABLATION (N/A)  Patient Location: PACU  Anesthesia Type:MAC  Level of Consciousness: awake and alert   Airway and Oxygen Therapy: Patient Spontanous Breathing  Post-op Pain: mild  Post-op Assessment: Post-op Vital signs reviewed, Patient's Cardiovascular Status Stable and Pain level controlled              Post-op Vital Signs: stable  Last Vitals:  Filed Vitals:   11/22/14 0915  BP: 133/86  Pulse: 81  Temp:   Resp: 20    Complications: No apparent anesthesia complications

## 2014-11-23 ENCOUNTER — Encounter (HOSPITAL_BASED_OUTPATIENT_CLINIC_OR_DEPARTMENT_OTHER): Payer: Self-pay | Admitting: Obstetrics and Gynecology

## 2014-11-23 NOTE — Op Note (Signed)
NAME:  Cheryl Tate, Cheryl Tate        ACCOUNT NO.:  [id number]  MEDICAL RECORD NO.:  [id number]  LOCATION:                                 FACILITY:  PHYSICIAN:  Mattoon Helane Rima, M.D.    DATE OF BIRTH:  DATE OF PROCEDURE:  11/22/2014 DATE OF DISCHARGE:                              OPERATIVE REPORT   PREOPERATIVE DIAGNOSES:  Menorrhagia and von Willebrand's disease.  POSTOPERATIVE DIAGNOSES:  Menorrhagia and von Willebrand's disease.  PROCEDURE:  Hysteroscopy and hydrothermal ablation.  SURGEON:  Markiesha Delia L. Helane Rima, M.D.  ANESTHESIA:  IV sedation paracervical block.  EBL:  Minimal.  COMPLICATIONS:  None.  PATHOLOGY:  None.  DESCRIPTION OF PROCEDURE:  The patient was taken to the operating room. She was given IV sedation.  She was then given a paracervical block after she was prepped and draped and a time-out was performed.  Cervix was grasped with a tenaculum.  The hysteroscope with the HTA attached was inserted into the uterine cavity.  With good visualization, we could see that she has a normal endometrium.  I did not do a D and C because she had started bleeding few days prior to the procedure and she had benign pathology in [place] this summer, and I did not want to cause excessive bleeding that would limit the ability for the HTA to be effective.  I did not want to limit my visualization.  The HTA was then performed with fluid check, it was normal and HTA was performed for 10 minutes with a good cycle.  At the end of the procedure, I could see that the endometrium was adequately ablated.  At the end of the procedure, all instruments were removed from the vagina.  All sponge, lap, and instrument counts were correct x2.  The patient had no vaginal bleeding.  She will follow up with me in the office in 2 weeks.     Mystery Schrupp L. Helane Rima, M.D.     Nevin Bloodgood  D:  11/22/2014  T:  11/23/2014  Job:  [id number]

## 2014-12-28 ENCOUNTER — Ambulatory Visit (INDEPENDENT_AMBULATORY_CARE_PROVIDER_SITE_OTHER): Payer: BLUE CROSS/BLUE SHIELD | Admitting: Physician Assistant

## 2014-12-28 VITALS — BP 120/78 | HR 84 | Temp 97.0°F | Resp 16 | Ht 63.0 in | Wt 180.0 lb

## 2014-12-28 DIAGNOSIS — L723 Sebaceous cyst: Secondary | ICD-10-CM

## 2014-12-28 DIAGNOSIS — Z1329 Encounter for screening for other suspected endocrine disorder: Secondary | ICD-10-CM

## 2014-12-28 DIAGNOSIS — J309 Allergic rhinitis, unspecified: Secondary | ICD-10-CM | POA: Diagnosis not present

## 2014-12-28 DIAGNOSIS — N921 Excessive and frequent menstruation with irregular cycle: Secondary | ICD-10-CM | POA: Diagnosis not present

## 2014-12-28 DIAGNOSIS — Z13220 Encounter for screening for lipoid disorders: Secondary | ICD-10-CM

## 2014-12-28 DIAGNOSIS — Z131 Encounter for screening for diabetes mellitus: Secondary | ICD-10-CM

## 2014-12-28 DIAGNOSIS — Z0000 Encounter for general adult medical examination without abnormal findings: Secondary | ICD-10-CM | POA: Diagnosis not present

## 2014-12-28 LAB — POCT URINALYSIS DIP (MANUAL ENTRY)
Bilirubin, UA: NEGATIVE
GLUCOSE UA: NEGATIVE
Ketones, POC UA: NEGATIVE
LEUKOCYTES UA: NEGATIVE
NITRITE UA: NEGATIVE
PH UA: 7.5
Protein Ur, POC: NEGATIVE
RBC UA: NEGATIVE
Spec Grav, UA: 1.015
UROBILINOGEN UA: 0.2

## 2014-12-28 LAB — CBC
HEMATOCRIT: 37 % (ref 36.0–46.0)
Hemoglobin: 12.8 g/dL (ref 12.0–15.0)
MCH: 30.9 pg (ref 26.0–34.0)
MCHC: 34.6 g/dL (ref 30.0–36.0)
MCV: 89.4 fL (ref 78.0–100.0)
MPV: 9.1 fL (ref 8.6–12.4)
PLATELETS: 292 10*3/uL (ref 150–400)
RBC: 4.14 MIL/uL (ref 3.87–5.11)
RDW: 13.6 % (ref 11.5–15.5)
WBC: 7.2 10*3/uL (ref 4.0–10.5)

## 2014-12-28 LAB — COMPREHENSIVE METABOLIC PANEL
ALBUMIN: 4.4 g/dL (ref 3.6–5.1)
ALK PHOS: 45 U/L (ref 33–115)
ALT: 19 U/L (ref 6–29)
AST: 18 U/L (ref 10–30)
BILIRUBIN TOTAL: 0.2 mg/dL (ref 0.2–1.2)
BUN: 13 mg/dL (ref 7–25)
CO2: 22 mmol/L (ref 20–31)
CREATININE: 0.44 mg/dL — AB (ref 0.50–1.10)
Calcium: 9 mg/dL (ref 8.6–10.2)
Chloride: 102 mmol/L (ref 98–110)
Glucose, Bld: 91 mg/dL (ref 65–99)
Potassium: 4.4 mmol/L (ref 3.5–5.3)
Sodium: 136 mmol/L (ref 135–146)
TOTAL PROTEIN: 6.9 g/dL (ref 6.1–8.1)

## 2014-12-28 LAB — LIPID PANEL
Cholesterol: 253 mg/dL — ABNORMAL HIGH (ref 125–200)
HDL: 47 mg/dL (ref >=46)
LDL CALC: 161 mg/dL — AB (ref <130)
TRIGLYCERIDES: 226 mg/dL — AB (ref <150)
Total CHOL/HDL Ratio: 5.4 Ratio — ABNORMAL HIGH (ref <=5.0)
VLDL: 45 mg/dL — ABNORMAL HIGH (ref <30)

## 2014-12-28 LAB — TSH: TSH: 1.445 u[IU]/mL (ref 0.350–4.500)

## 2014-12-28 NOTE — Progress Notes (Signed)
[facility] [address] [place] [address] [phone]  Date:  12/28/2014   Name:  Cheryl Tate   DOB:  04-28-1970   MRN:  [id number]  PCP:  No PCP Per Patient    Chief Complaint: Annual Exam   History of Present Illness:  This is a 44 y.o. female with PMH von willebrand disease, allergic rhinitis who is presenting for CPE.  She complains of a spot on her chest that has grown over the past month. It is mildly tender with palpation. No redness or drainage.  Pt had an uterine ablation on nov 1st d/t abnormal heavy bleeding d/t von willebrand disease. She states she has been very happy with results. She had some light spotting 4 days ago. She had pap and breast exam before this procedure with GYN.  Allergic rhinitis - much better after starting flonase about a year ago. She ran out of flonase and having more problems with allergies lately.  LMP: 4 days ago. Last pap: had ablation on nov 1st, pap nov Sexual history: sexually active with husband. Does not want STD testing today Immunizations: declines flu, tdap 2015 Dentist: regular 6 month visits Eye: near vision worsening but has reading glasses that help. Distant vision ok. Diet/Exercise: walks every morning. Eats a lot of vegetables. Drinks lots of water. States she knows she could do better with her diet. Fam hx: bipolar maternal side, no DM, no CVA or MI, MGM lung cancer Tobacco/alcohol/substance use: 1 cig a day/wine occ/no Mammogram:  Sleeping well. Mood is good. Pt from [place].  Review of Systems:  Review of Systems  Constitutional: Negative.   HENT: Positive for congestion and rhinorrhea.   Eyes: Negative.   Respiratory: Negative.   Cardiovascular: Negative.   Gastrointestinal: Negative.   Endocrine: Negative.   Genitourinary: Negative.   Musculoskeletal: Negative.   Skin:       Skin lesion on chest  Allergic/Immunologic: Positive for environmental allergies.  Neurological:  Negative.   Hematological: Negative for adenopathy. Bruises/bleeds easily.  Psychiatric/Behavioral: Negative.     Patient Active Problem List   Diagnosis Date Noted  . Acute maxillary sinusitis 02/27/2014    Prior to Admission medications   Medication Sig Start Date End Date Taking? Authorizing Provider  valproic acid (DEPAKENE) 250 MG capsule Take 500 mg by mouth Nightly. PT GETS FROM [place]   Yes Historical Provider, MD                                       No Known Allergies  Past Surgical History  Procedure Laterality Date  . Hysteroscopy w/d&c  08-08-2014  in [place]  . Appendectomy  2006  . Dilitation & currettage/hystroscopy with hydrothermal ablation N/A 11/22/2014    Procedure: HYSTEROSCOPY WITH HYDROTHERMAL ABLATION;  Surgeon: Dian Queen, MD;  Location: [facility];  Service: Gynecology;  Laterality: N/A;    Social History  Substance Use Topics  . Smoking status: Current Every Day Smoker -- 0.20 packs/day    Types: Cigarettes  . Smokeless tobacco: Never Used     Comment: 2 cig daily  . Alcohol Use: 4.2 oz/week    7 Glasses of wine, 0 Standard drinks or equivalent per week     Comment: one wine daily    Family History  Problem Relation Age of Onset  . Mental retardation Mother   . Mental retardation Brother   .  Mental retardation Maternal Grandmother     Medication list has been reviewed and updated.  Physical Examination:  Physical Exam  Constitutional: She is oriented to person, place, and time.  HENT:  Head: Normocephalic and atraumatic.  Right Ear: Hearing, external ear and ear canal normal. A middle ear effusion is present.  Left Ear: Hearing, external ear and ear canal normal. A middle ear effusion is present.  Nose: Nose normal.  Mouth/Throat: Uvula is midline, oropharynx is clear and moist and mucous membranes are normal.  Eyes: Conjunctivae, EOM and lids are normal. Pupils are equal, round, and reactive to light. Right eye  exhibits no discharge. Left eye exhibits no discharge. No scleral icterus.  Neck: Trachea normal. Carotid bruit is not present. No thyromegaly present.  Cardiovascular: Normal rate, regular rhythm, normal heart sounds, intact distal pulses and normal pulses.   No murmur heard. Pulmonary/Chest: Effort normal and breath sounds normal. She has no wheezes. She has no rhonchi. She has no rales.  Abdominal: Soft. Normal appearance. There is no tenderness.  Genitourinary:  GU and breast exam deferred, done at GYN  Musculoskeletal: Normal range of motion.  Lymphadenopathy:       Head (right side): No submental, no submandibular, no tonsillar, no preauricular, no posterior auricular and no occipital adenopathy present.       Head (left side): No submental, no submandibular, no tonsillar, no preauricular, no posterior auricular and no occipital adenopathy present.    She has no cervical adenopathy.  Neurological: She is alert and oriented to person, place, and time. No cranial nerve deficit. Coordination and gait normal.  Skin: Skin is warm, dry and intact.  Sebaceous cyst on chest, about 1 cm. Non-tender and no evidence infection  Psychiatric: She has a normal mood and affect. Her speech is normal and behavior is normal. Thought content normal.   BP 120/78 mmHg  Pulse 84  Temp(Src) 97 F (36.1 C) (Oral)  Resp 16  Ht 5' 3"  (1.6 m)  Wt 180 lb (81.647 kg)  BMI 31.89 kg/m2  SpO2 98%   Visual Acuity Screening   Right eye Left eye Both eyes  Without correction: 20/25 20/20 20/20   With correction:      Assessment and Plan:  1. Annual physical exam Up to date on preventative screening Declined flu Labs pending Doing well with exercise, discussed diet and weight loss. - CBC - POCT urinalysis dipstick  2. Lipid screening - Lipid panel  3. Screening for diabetes mellitus - Comprehensive metabolic panel  4. Thyroid disorder screen - TSH  5. Allergic rhinitis, unspecified allergic  rhinitis type Start back on flonase.  6. Sebaceous cyst Return at appt center 12/8 for removal  7. menorrhagia Doing well after ablation procedure. F/u GYN.  Benjaman Pott Drenda Freeze, MHS [facility]  12/28/2014

## 2014-12-28 NOTE — Patient Instructions (Signed)
Start back on flonase daily I will call you with your lab results. Return tomorrow at 11:30 at the building next door to have your cyst removed.

## 2014-12-29 ENCOUNTER — Ambulatory Visit (INDEPENDENT_AMBULATORY_CARE_PROVIDER_SITE_OTHER): Payer: BLUE CROSS/BLUE SHIELD | Admitting: Physician Assistant

## 2014-12-29 ENCOUNTER — Encounter: Payer: Self-pay | Admitting: Physician Assistant

## 2014-12-29 VITALS — BP 115/79 | HR 80 | Temp 97.9°F | Resp 16 | Ht 62.75 in | Wt 178.0 lb

## 2014-12-29 DIAGNOSIS — F329 Major depressive disorder, single episode, unspecified: Secondary | ICD-10-CM

## 2014-12-29 DIAGNOSIS — L723 Sebaceous cyst: Secondary | ICD-10-CM

## 2014-12-29 DIAGNOSIS — J309 Allergic rhinitis, unspecified: Secondary | ICD-10-CM | POA: Diagnosis not present

## 2014-12-29 DIAGNOSIS — F32A Depression, unspecified: Secondary | ICD-10-CM

## 2014-12-29 NOTE — Progress Notes (Signed)
  Medical screening examination/treatment/procedure(s) were performed by non-physician practitioner and as supervising physician I was immediately available for consultation/collaboration.

## 2014-12-29 NOTE — Progress Notes (Signed)
[facility] [address], [place] [address] [phone]  Date:  12/29/2014   Name:  Cheryl Tate   DOB:  27-Oct-1970   MRN:  [id number]  PCP:  No PCP Per Patient    Chief Complaint: Cyst   History of Present Illness:  This is a 44 y.o. female who is presenting for sebaceous cyst removal from her anterior chest. Mildly tender. No erythema, drainage.  She started back on flonase yesterday and states already her allergic symptoms have improved.  Pt states she takes depakene for depression. She was started on this by her physician in [place]. She goes back once a year for f/u. Plan to taper off. Mood is good.  Review of Systems:  Review of Systems See HPI  Patient Active Problem List   Diagnosis Date Noted  . Rhinitis, allergic 12/28/2014    Prior to Admission medications   Medication Sig Start Date End Date Taking? Authorizing Provider  Calcium Citrate-Vitamin D (CALCIUM + D PO) Take by mouth daily.   Yes Historical Provider, MD  magnesium gluconate (MAGONATE) 30 MG tablet Take 30 mg by mouth 2 (two) times daily.   Yes Historical Provider, MD  valproic acid (DEPAKENE) 250 MG capsule Take 500 mg by mouth Nightly. PT GETS FROM [place]   Yes Historical Provider, MD  vitamin C (ASCORBIC ACID) 500 MG tablet Take 500 mg by mouth daily.   Yes Historical Provider, MD  vitamin E 400 UNIT capsule Take 400 Units by mouth daily.   Yes Historical Provider, MD                  No Known Allergies  Past Surgical History  Procedure Laterality Date  . Hysteroscopy w/d&c  08-08-2014  in [place]  . Appendectomy  2006  . Dilitation & currettage/hystroscopy with hydrothermal ablation N/A 11/22/2014    Procedure: HYSTEROSCOPY WITH HYDROTHERMAL ABLATION;  Surgeon: Dian Queen, MD;  Location: [facility];  Service: Gynecology;  Laterality: N/A;    Social History  Substance Use Topics  . Smoking status: Current Every Day Smoker -- 0.20 packs/day   Types: Cigarettes  . Smokeless tobacco: Never Used     Comment: 2 cig daily  . Alcohol Use: 4.2 oz/week    7 Glasses of wine, 0 Standard drinks or equivalent per week     Comment: one wine daily    Family History  Problem Relation Age of Onset  . Mental retardation Mother   . Mental retardation Brother   . Mental retardation Maternal Grandmother     Medication list has been reviewed and updated.  Physical Examination:  Physical Exam  Constitutional: She is oriented to person, place, and time. She appears well-developed and well-nourished. No distress.  HENT:  Head: Normocephalic and atraumatic.  Right Ear: Hearing normal.  Left Ear: Hearing normal.  Nose: Nose normal.  Eyes: Conjunctivae and lids are normal. Right eye exhibits no discharge. Left eye exhibits no discharge. No scleral icterus.  Pulmonary/Chest: Effort normal. No respiratory distress.  Musculoskeletal: Normal range of motion.  Neurological: She is alert and oriented to person, place, and time.  Skin: Skin is warm, dry and intact.     1 cm sebaceous cyst on anterior chest Mildly ttp No evidence infection  Psychiatric: She has a normal mood and affect. Her speech is normal and behavior is normal. Thought content normal.    BP 115/79 mmHg  Pulse 80  Temp(Src) 97.9 F (36.6 C) (Oral)  Resp 16  Ht 5' 2.75" (1.594 m)  Wt 178 lb (80.74 kg)  BMI 31.78 kg/m2  SpO2 95%  LMP 12/26/2014  Procedure: Verbal consent obtained. Skin was cleaned with alcohol and anesthetized with 1 cc 1% lido with epi. A 0.75 cm incision was made. Sebaceous material and capsule removed completely. #1 horizontal 5.0 ethilon suture placed. Wound dressed and wound care discussed.  Assessment and Plan:  1. Sebaceous cyst Return in 7-10 days for suture removal. Wound care discussed  2. Depression Takes depakene. Prescribed by physician in [place]. Planning to taper off soon, per that physician's instructions.  3. Allergic rhinitis,  unspecified allergic rhinitis type Improved since starting flonase.   Benjaman Pott Drenda Freeze, MHS [facility]  12/29/2014

## 2014-12-29 NOTE — Patient Instructions (Signed)
WOUND CARE Please return in 7-10 days to have your stitches/staples removed or sooner if you have concerns. Marland Kitchen Keep area clean and dry with dressing in place for 24 hours. . After 24 hours, remove bandage and wash wound gently with mild soap and warm water. When skin is dry, reapply a new bandage. Once wound is no longer draining, may leave wound open to air. . Continue daily cleansing with soap and water until stitches/staples are removed. . Do not apply any ointments or creams to the wound while stitches/staples are in place, as this may cause delayed healing. . Notify the office if you experience any of the following signs of infection: Swelling, redness, pus drainage, streaking, fever >101.0 F . Notify the office if you experience excessive bleeding that does not stop after 15-20 minutes of constant, firm pressure.

## 2014-12-29 NOTE — Addendum Note (Signed)
Addended by: Roselee Culver on: 12/29/2014 08:22 AM   Modules accepted: Miquel Dunn

## 2015-03-02 ENCOUNTER — Encounter: Payer: Self-pay | Admitting: Family

## 2015-03-02 ENCOUNTER — Ambulatory Visit: Payer: BLUE CROSS/BLUE SHIELD

## 2015-03-02 ENCOUNTER — Other Ambulatory Visit (HOSPITAL_BASED_OUTPATIENT_CLINIC_OR_DEPARTMENT_OTHER): Payer: BLUE CROSS/BLUE SHIELD

## 2015-03-02 ENCOUNTER — Ambulatory Visit (HOSPITAL_BASED_OUTPATIENT_CLINIC_OR_DEPARTMENT_OTHER): Payer: BLUE CROSS/BLUE SHIELD | Admitting: Family

## 2015-03-02 VITALS — BP 114/73 | HR 68 | Temp 97.9°F | Resp 16 | Ht 62.0 in | Wt 189.0 lb

## 2015-03-02 DIAGNOSIS — R5383 Other fatigue: Secondary | ICD-10-CM | POA: Diagnosis not present

## 2015-03-02 DIAGNOSIS — N921 Excessive and frequent menstruation with irregular cycle: Secondary | ICD-10-CM

## 2015-03-02 DIAGNOSIS — N920 Excessive and frequent menstruation with regular cycle: Secondary | ICD-10-CM

## 2015-03-02 DIAGNOSIS — D680 Von Willebrand's disease: Secondary | ICD-10-CM

## 2015-03-02 DIAGNOSIS — R531 Weakness: Secondary | ICD-10-CM

## 2015-03-02 DIAGNOSIS — R002 Palpitations: Secondary | ICD-10-CM

## 2015-03-02 LAB — CBC WITH DIFFERENTIAL (CANCER CENTER ONLY)
BASO#: 0 10*3/uL (ref 0.0–0.2)
BASO%: 0.5 % (ref 0.0–2.0)
EOS%: 3.8 % (ref 0.0–7.0)
Eosinophils Absolute: 0.3 10*3/uL (ref 0.0–0.5)
HEMATOCRIT: 36.8 % (ref 34.8–46.6)
HEMOGLOBIN: 12.2 g/dL (ref 11.6–15.9)
LYMPH#: 2.1 10*3/uL (ref 0.9–3.3)
LYMPH%: 32.6 % (ref 14.0–48.0)
MCH: 30.3 pg (ref 26.0–34.0)
MCHC: 33.2 g/dL (ref 32.0–36.0)
MCV: 92 fL (ref 81–101)
MONO#: 0.6 10*3/uL (ref 0.1–0.9)
MONO%: 9.5 % (ref 0.0–13.0)
NEUT%: 53.6 % (ref 39.6–80.0)
NEUTROS ABS: 3.5 10*3/uL (ref 1.5–6.5)
Platelets: 306 10*3/uL (ref 145–400)
RBC: 4.02 10*6/uL (ref 3.70–5.32)
RDW: 12.6 % (ref 11.1–15.7)
WBC: 6.5 10*3/uL (ref 3.9–10.0)

## 2015-03-02 LAB — PROTIME-INR (CHCC SATELLITE)
INR: 0.9 — AB (ref 2.0–3.5)
PROTIME: 10.8 s (ref 10.6–13.4)

## 2015-03-02 LAB — CHCC SATELLITE - SMEAR

## 2015-03-02 NOTE — Progress Notes (Signed)
Hematology/Oncology Consultation   Name: Cheryl Tate      MRN: [id number]    Location: Room/bed info not found  Date: 03/02/2015 Time:10:21 AM   REFERRING PHYSICIAN: Dian Queen  REASON FOR CONSULT: Von Willebrand's and menorrhagia    DIAGNOSIS:  1. Von Willebrand's  2. Menorrhagia   HISTORY OF PRESENT ILLNESS: Ms. Cheryl Tate is a very pleasant 45 yo white female with history of Von Willebrand's (diagnosed 8 years ago in [place]) and menorrhagia. She states that she has had 5 D&C procedures while she was living in [place] and she an ablation in November of last year that failed.  She describes her cycles as heavy and having large clots. Her last cycle (January 20th) lasted 9 days. She states that she had 2 light cycles after the ablation and then resumed the heavy cycle the third month.  She had also had a Depo injection with no relief. She [place] Tranex [place] and states that this was helpful.  She has history of postpartum hemorrhage with all 3 births. She had history of 1 miscarriage which was early an the cause was undetermined.  She has received IV iron in the past for anemia. No blood transfusions. She takes oral iron as needed before and after her cycle. She states that the oral iron does help.  She is symptomatic with fatigue, weakness, dizziness and palpitations.  She has had heavy nose bleeds in the past.  She has had dental surgery and had her appendix out without an episodes of bleeding. Her 44 yo son was also diagnosed with von willebrand's when he developed nose bleeds as well.  She states that her paternal grandmother had a hysterectomy due to hemorrhaging.  Her mother has a history of cervical and lung cancer.  She [place] to the [place] 2 years ago with her family for her husband's job. She also works and enjoys her job.  If she eventually requires a hysterectomy she prefers to have this in [place] as it will be free. She has a gynecologist and hematologist there as well.    She does smoke 1-3 cigarettes a day and also has a glass of wine in the evening on occasion.  No fever, chills, n/v, cough, rash, SOB, chest pain, abdominal pain or changes in bowel or bladder habits.  She has had no excessive bruising or petechiae.  She has numbness and tingling in her hands that comes and goes with her cycle. She also has joint pain around the time she has her cycle. No swelling or tenderness in her extremities at this time.  She has a good appetite and stays well hydrated.   ROS: All other 10 point review of systems is negative.   PAST MEDICAL HISTORY:   Past Medical History  Diagnosis Date  . Von Willebrand's disease [facility])     followed by hemotologist in [place]  . Menorrhagia   . Anemia   . Mood disorder ([place])   . Depression     ALLERGIES: No Known Allergies    MEDICATIONS:  Current Outpatient Prescriptions on File Prior to Visit  Medication Sig Dispense Refill  . Calcium Citrate-Vitamin D (CALCIUM + D PO) Take by mouth daily.    . magnesium gluconate (MAGONATE) 30 MG tablet Take 30 mg by mouth 2 (two) times daily.    Marland Kitchen valproic acid (DEPAKENE) 250 MG capsule Take 500 mg by mouth Nightly. PT GETS FROM [place]    . vitamin C (ASCORBIC ACID) 500 MG tablet Take 500  mg by mouth daily.    . vitamin E 400 UNIT capsule Take 400 Units by mouth daily.     No current facility-administered medications on file prior to visit.     PAST SURGICAL HISTORY Past Surgical History  Procedure Laterality Date  . Hysteroscopy w/d&c  08-08-2014  in [place]  . Appendectomy  2006  . Dilitation & currettage/hystroscopy with hydrothermal ablation N/A 11/22/2014    Procedure: HYSTEROSCOPY WITH HYDROTHERMAL ABLATION;  Surgeon: Dian Queen, MD;  Location: [facility];  Service: Gynecology;  Laterality: N/A;    FAMILY HISTORY: Family History  Problem Relation Age of Onset  . Mental retardation Mother   . Mental retardation Brother   . Mental retardation Maternal  Grandmother     SOCIAL HISTORY:  reports that she has been smoking Cigarettes.  She has been smoking about 0.20 packs per day. She has never used smokeless tobacco. She reports that she drinks about 4.2 oz of alcohol per week. She reports that she does not use illicit drugs.  PERFORMANCE STATUS: The patient's performance status is 1 - Symptomatic but completely ambulatory  PHYSICAL EXAM: Most Recent Vital Signs: There were no vitals taken for this visit. BP 114/73 mmHg  Pulse 68  Temp(Src) 97.9 F (36.6 C) (Oral)  Resp 16  Ht 5' 2"  (1.575 m)  Wt 189 lb (85.73 kg)  BMI 34.56 kg/m2  General Appearance:    Alert, cooperative, no distress, appears stated age  Head:    Normocephalic, without obvious abnormality, atraumatic  Eyes:    PERRL, conjunctiva/corneas clear, EOM's intact, fundi    benign, both eyes        Throat:   Lips, mucosa, and tongue normal; teeth and gums normal  Neck:   Supple, symmetrical, trachea midline, no adenopathy;    thyroid:  no enlargement/tenderness/nodules; no carotid   bruit or JVD  Back:     Symmetric, no curvature, ROM normal, no CVA tenderness  Lungs:     Clear to auscultation bilaterally, respirations unlabored  Chest Wall:    No tenderness or deformity   Heart:    Regular rate and rhythm, S1 and S2 normal, no murmur, rub   or gallop     Abdomen:     Soft, non-tender, bowel sounds active all four quadrants,    no masses, no organomegaly        Extremities:   Extremities normal, atraumatic, no cyanosis or edema  Pulses:   2+ and symmetric all extremities  Skin:   Skin color, texture, turgor normal, no rashes or lesions  Lymph nodes:   Cervical, supraclavicular, and axillary nodes normal  Neurologic:   CNII-XII intact, normal strength, sensation and reflexes    throughout   LABORATORY DATA:  No results found for this or any previous visit (from the past 48 hour(s)).    RADIOGRAPHY: No results found.     PATHOLOGY: None   ASSESSMENT/PLAN:  Ms. Cheryl Tate is a very pleasant 45 yo white female with history of Von Maurine Minister (diagnosed 8 years ago in [place]) and menorrhagia. She has had cycles lasting from 5-9 days with large clots.  She has history of postpartum hemorrhage but no bleeding with dental surgery or appendectomy.  She is symptomatic with fatigue, weakness and palpitations.  Her Von Willebrand panel is pending. We will see what this shows and then determine if we should try her on Ddavp nasal. Once we have these results we will schedule her follow-up.  Her CBC is  stable at this time. No anemia.  All questions were answered. She will contact us with any problems, questions or concerns. We can certainly see her much sooner if necessary.  She was discussed with and also seen by Dr. Marin Olp and he is in agreement with the aforementioned.   [facility]     Addendum:   I saw and examined the patient with Alanzo Lamb. Her vonl Willebrand studies clearly show that she has von Willebrand disease.  Her PTT is normal which is nice to see. Her von Willebrand factor is only 39%. Her von Willebrand activity is 28%. Her factor VIII level is 42%.  I think that we probably can consider intranasal DDAVP for her. Ultimately, she will probably need to have a hysterectomy or something to help minimize her menstrual cycles.  She plans to go back to [place] for surgery. I think she will see her gynecologist here soon.  We spent about 45 minutes with her. She obviously is very well versed in by Willebrand disease. Hopefully, the intranasal DDAVP will provide some benefit for her.  We will plan to get her back in about 6 weeks. Hopefully we will find that she will do okay. Again, I think the source of bleeding, that being her uterus, will have to be removed.  Lum Keas

## 2015-03-03 LAB — APTT: aPTT: 28 s (ref 24–33)

## 2015-03-04 LAB — VON WILLEBRAND PANEL
FACTOR VIII ACTIVITY: 42 % — AB (ref 57–163)
PDF Image: 0
vWF Activity: 28 % — ABNORMAL LOW (ref 50–200)
von Willebrand Factor (vWF) Ag: 39 % — ABNORMAL LOW (ref 50–200)

## 2015-03-06 ENCOUNTER — Other Ambulatory Visit: Payer: Self-pay | Admitting: Family

## 2015-03-07 ENCOUNTER — Other Ambulatory Visit: Payer: Self-pay | Admitting: Family

## 2015-03-08 ENCOUNTER — Other Ambulatory Visit: Payer: Self-pay | Admitting: Family

## 2015-03-08 ENCOUNTER — Telehealth: Payer: Self-pay | Admitting: Family

## 2015-03-08 DIAGNOSIS — D6800 Von Willebrand disease, unspecified: Secondary | ICD-10-CM

## 2015-03-08 MED ORDER — DESMOPRESSIN ACETATE 1.5 MG/ML NA SOLN: 1.0000 | NASAL | Status: DC

## 2015-03-08 NOTE — Telephone Encounter (Signed)
I spoke with Ms. Cheryl Tate over the phone and discussed her using Stimate nasal spray the day her cycle starts and the day after to help with her menorrhagia. She states that she started her cycle on Monday and began to hemorrhage so she contacted her gynecologist and was started on Megestrol. This has helped lighten her cycle and she will finish her prescribed 7 days. She would like to try the Stimate beginning with her next cycle. All questions were answered. Prescription was sent to her pharmacy. We will have her follow-up with [place] in 6 weeks.

## 2015-03-28 LAB — HM MAMMOGRAPHY

## 2015-04-06 ENCOUNTER — Encounter: Payer: Self-pay | Admitting: Family Medicine

## 2015-04-17 ENCOUNTER — Ambulatory Visit: Payer: BLUE CROSS/BLUE SHIELD | Admitting: Family

## 2015-04-17 ENCOUNTER — Other Ambulatory Visit: Payer: BLUE CROSS/BLUE SHIELD

## 2015-04-20 ENCOUNTER — Ambulatory Visit: Payer: BLUE CROSS/BLUE SHIELD | Admitting: Family

## 2015-04-20 ENCOUNTER — Other Ambulatory Visit: Payer: BLUE CROSS/BLUE SHIELD

## 2015-06-06 ENCOUNTER — Ambulatory Visit (INDEPENDENT_AMBULATORY_CARE_PROVIDER_SITE_OTHER): Payer: BLUE CROSS/BLUE SHIELD | Admitting: Physician Assistant

## 2015-06-06 VITALS — BP 134/80 | HR 74 | Temp 98.6°F | Resp 18 | Ht 62.0 in | Wt 187.8 lb

## 2015-06-06 DIAGNOSIS — G43019 Migraine without aura, intractable, without status migrainosus: Secondary | ICD-10-CM | POA: Diagnosis not present

## 2015-06-06 DIAGNOSIS — D6800 Von Willebrand disease, unspecified: Secondary | ICD-10-CM

## 2015-06-06 NOTE — Progress Notes (Signed)
[facility] [address] [place] [address] [phone]  Date:  06/06/2015   Name:  Cheryl Tate   DOB:  1970-08-01   MRN:  [id number]  PCP:  No PCP Per Patient    Chief Complaint: Headache and Nausea   History of Present Illness:  This is a 45 y.o. female with PMH von willebrand disease who is presenting with headache x 4 days. Headache located to left occipital region. Starts in left thorax/neck and radiates to occipital region. +nausea. +photophobia. +phonophobia. Denies weakness, numbness. Dizzy episode 1 week ago that lasted a few hours. None since. This was abnormal for her. She generally does not have problems with headaches. If she does get a headache she will take an [place] brand of ibuprofen (ketophen) and goes away. She is taking 2 ketophen a day (each pill equivalent to 800 mg ibuprofen) and helping. 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 States she does feel it is originating in her neck. Generally does not have problems with her von willebrand disease. Has to avoid nsaids when she is on her period. Saw a hematologist 02/2015. Started her on intranasal DDAVP. Suggested hysterectomy -- pt plans to get surgery when she is back in [place].  Review of Systems:  Review of Systems See HPI  Patient Active Problem List   Diagnosis Date Noted  . Depression 12/29/2014  . Rhinitis, allergic 12/28/2014    Prior to Admission medications   Medication Sig Start Date End Date Taking? Authorizing Provider  Calcium Citrate-Vitamin D (CALCIUM + D PO) Take by mouth daily.   Yes Historical Provider, MD  magnesium gluconate (MAGONATE) 30 MG tablet Take 30 mg by mouth 2 (two) times daily.   Yes Historical Provider, MD  Omega-3 Fatty Acids (FISH OIL PO) Take by mouth.   Yes Historical Provider, MD  valproic acid (DEPAKENE) 250 MG capsule Take 500 mg by mouth Nightly. PT GETS  FROM [place]   Yes Historical Provider, MD  vitamin C (ASCORBIC ACID) 500 MG tablet Take 500 mg by mouth daily.   Yes Historical Provider, MD  vitamin E 400 UNIT capsule Take 400 Units by mouth daily.   Yes Historical Provider, MD  desmopressin (STIMATE) 1.5 MG/ML SOLN Place 1 spray (0.15 mg total) into the nose as directed. 1 spray into the nose on day cycle starts and 1 spray into the nose the next day Patient not taking: Reported on 06/06/2015 03/08/15   Eliezer Bottom, NP    No Known Allergies  Past Surgical History  Procedure Laterality Date  . Hysteroscopy w/d&c  08-08-2014  in [place]  . Appendectomy  2006  . Dilitation & currettage/hystroscopy with hydrothermal ablation N/A 11/22/2014    Procedure: HYSTEROSCOPY WITH HYDROTHERMAL ABLATION;  Surgeon: Dian Queen, MD;  Location: [facility];  Service: Gynecology;  Laterality: N/A;    Social History  Substance Use Topics  . Smoking status: Current Every Day Smoker -- 0.20 packs/day    Types: Cigarettes  . Smokeless tobacco: Never Used     Comment: 2 cig daily  . Alcohol Use: 4.2 oz/week    7 Glasses of wine, 0 Standard drinks or equivalent per week     Comment: one wine daily    Family History  Problem Relation Age of Onset  . Mental retardation Mother   . Mental retardation Brother   .  Mental retardation Maternal Grandmother     Medication list has been reviewed and updated.  Physical Examination:  Physical Exam  Constitutional: She is oriented to person, place, and time. She appears well-developed and well-nourished. No distress.  HENT:  Head: Normocephalic and atraumatic.  Right Ear: Hearing, tympanic membrane, external ear and ear canal normal.  Left Ear: Hearing, tympanic membrane, external ear and ear canal normal.  Nose: Nose normal. Right sinus exhibits no maxillary sinus tenderness and no frontal sinus tenderness. Left sinus exhibits no maxillary sinus tenderness and no frontal sinus  tenderness.  Mouth/Throat: Uvula is midline, oropharynx is clear and moist and mucous membranes are normal.  Eyes: Conjunctivae, EOM and lids are normal. Pupils are equal, round, and reactive to light. Right eye exhibits no discharge. Left eye exhibits no discharge. No scleral icterus.  Cardiovascular: Normal rate, regular rhythm, normal heart sounds and normal pulses.   No murmur heard. Pulmonary/Chest: Effort normal and breath sounds normal. No respiratory distress. She has no wheezes. She has no rhonchi. She has no rales.  Musculoskeletal: Normal range of motion.       Cervical back: She exhibits normal range of motion, no tenderness and no bony tenderness.       Thoracic back: She exhibits tenderness (left paraspinal). She exhibits normal range of motion and no bony tenderness.  Lymphadenopathy:       Head (right side): No submental, no submandibular and no tonsillar adenopathy present.       Head (left side): No submental, no submandibular and no tonsillar adenopathy present.    She has no cervical adenopathy.  Neurological: She is alert and oriented to person, place, and time. She has normal strength and normal reflexes. No cranial nerve deficit or sensory deficit. Gait normal.  Skin: Skin is warm, dry and intact. No lesion and no rash noted.  Psychiatric: She has a normal mood and affect. Her speech is normal and behavior is normal. Thought content normal.   BP 134/80 mmHg  Pulse 74  Temp(Src) 98.6 F (37 C) (Oral)  Resp 18  Ht 5' 2"  (1.575 m)  Wt 187 lb 12.8 oz (85.186 kg)  BMI 34.34 kg/m2  SpO2 97%  LMP 05/25/2015  Assessment and Plan:  1. Intractable migraine without aura and without status migrainosus Think pt is having a migraine triggered by muscle spasm of left thorax and neck. Neuro exam normal. She can continue ketophen but want her to alternate with tylenol, no more than 1 ketophen a day, esp with her hx von willebrand. She has a muscle relaxer at home, she is not sure  of the name, and she will take at night. Use heating pad during the day. Return in 2 days if headache is not improving or at any time if symptoms get much worse.   Benjaman Pott Drenda Freeze, MHS [facility]  06/13/2015

## 2015-06-06 NOTE — Patient Instructions (Signed)
     IF you received an x-ray today, you will receive an invoice from [facility]. Please contact [facility] at [phone] with questions or concerns regarding your invoice.   IF you received labwork today, you will receive an invoice from [facility]. Please contact [facility] at [phone] with questions or concerns regarding your invoice.   Our billing staff will not be able to assist you with questions regarding bills from these companies.  You will be contacted with the lab results as soon as they are available. The fastest way to get your results is to activate your My Chart account. Instructions are located on the last page of this paperwork. If you have not heard from us regarding the results in 2 weeks, please contact this office.      

## 2015-06-13 DIAGNOSIS — D6800 Von Willebrand disease, unspecified: Secondary | ICD-10-CM | POA: Insufficient documentation

## 2015-06-16 ENCOUNTER — Ambulatory Visit (INDEPENDENT_AMBULATORY_CARE_PROVIDER_SITE_OTHER): Payer: BLUE CROSS/BLUE SHIELD | Admitting: Physician Assistant

## 2015-06-16 ENCOUNTER — Telehealth: Payer: Self-pay | Admitting: Physician Assistant

## 2015-06-16 ENCOUNTER — Ambulatory Visit (HOSPITAL_COMMUNITY)
Admission: RE | Admit: 2015-06-16 | Discharge: 2015-06-16 | Disposition: A | Discharge status: Home / Self Care | Payer: BLUE CROSS/BLUE SHIELD | Source: Ambulatory Visit | Attending: Physician Assistant | Admitting: Physician Assistant

## 2015-06-16 VITALS — BP 122/72 | HR 83 | Temp 98.1°F | Resp 17 | Ht 62.5 in | Wt 186.0 lb

## 2015-06-16 DIAGNOSIS — R519 Headache, unspecified: Secondary | ICD-10-CM

## 2015-06-16 DIAGNOSIS — Z6833 Body mass index (BMI) 33.0-33.9, adult: Secondary | ICD-10-CM | POA: Insufficient documentation

## 2015-06-16 DIAGNOSIS — R51 Headache: Secondary | ICD-10-CM

## 2015-06-16 NOTE — Telephone Encounter (Signed)
Spoke with patient. CT scan reassuring.  Increase use of cyclobenzaprine, up to 10 mg TID. If not helpful, would try Valium. If headache persists after he upcoming menstrual bleeding, she would consider oral prednisone and I would refer to neurology.

## 2015-06-16 NOTE — Progress Notes (Signed)
Patient ID: Cheryl Tate, female    DOB: Oct 07, 1970, 45 y.o.   MRN: [id number]  PCP: No PCP Per Patient  Subjective:   Chief Complaint  Patient presents with  . Headache    HPI Presents for evaluation of persistent headache now x 2 weeks.  She was seen here on 5/16 presenting with occipital headache x 4 days. Was heading to a friend's graduation and it started out of the blue in the car on the way. Also had left thorax/neck pain. +nausea. +photophobia. +phonophobia. Denied weakness, numbness. Dizzy episode 1 week previously that lasted a few hours. None since.  She generally does not have headaches like this, though she reports menstrual headache and sinus headache. This is different. She uses an [place] brand of ibuprofen (ketophen) and usually has good results. 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 Stated she does feel it was originating in her neck.  She was advised to reduce the ketoprofen dose, supplement with acetaminophen and use cyclobenzaprine 5 mg (that she already had) at HS. Ketoprofen has been helping, but she has stopped it due to upcoming menstrual bleed, and she doesn't want it to be heavier. Did not take acetaminophen, nor muscle relaxer until last night-felt like it relaxed her back a little, but didn't help the headache. Doesn't like to take it because it makes her feel groggy the next day.  A friend who is a physiotherapist has done some dry needling. And another friend, an osteopath is seeing her tomorrow.  She states that while she continues to have neck pain, it's clear to her that the head pain is radiating into the neck rather than the other way around. She locates the pain in the SCM on the LEFT and to a lesser extent in the LEFT cervical paraspinous muscles.  She continues to deny weakness, radicular pain into the arm/jaw. She is having some LEFT ear pain, but not  fullness or popping. She had some difficulty with grasping with her right hand yesterday, but only briefly, and she isn't dropping things. She thinks she had some tingling in one of her feet yesterday, but isn't sure which one. Intermittent nausea. Headache is worse with leaning over. No fever, chills.  Generally does not have problems with her von willebrand disease. Has to avoid nsaids when she is on her period. Saw a hematologist 02/2015. Started her on intranasal DDAVP. Suggested hysterectomy -- pt plans to get surgery when she is back in [place].   Review of Systems As above.    Patient Active Problem List   Diagnosis Date Noted  . Von Willebrand disease ([place]) 06/13/2015  . Depression 12/29/2014  . Rhinitis, allergic 12/28/2014     Prior to Admission medications   Medication Sig Start Date End Date Taking? Authorizing Provider  Calcium Citrate-Vitamin D (CALCIUM + D PO) Take by mouth daily.   Yes Historical Provider, MD  magnesium gluconate (MAGONATE) 30 MG tablet Take 30 mg by mouth 2 (two) times daily.   Yes Historical Provider, MD  Omega-3 Fatty Acids (FISH OIL PO) Take by mouth.   Yes Historical Provider, MD  valproic acid (DEPAKENE) 250 MG capsule Take 500 mg by mouth Nightly. PT GETS FROM [place]   Yes Historical Provider, MD  vitamin C (ASCORBIC ACID) 500 MG tablet Take 500 mg by mouth daily.   Yes Historical Provider, MD  vitamin  E 400 UNIT capsule Take 400 Units by mouth daily.   Yes Historical Provider, MD  desmopressin (STIMATE) 1.5 MG/ML SOLN Place 1 spray (0.15 mg total) into the nose as directed. 1 spray into the nose on day cycle starts and 1 spray into the nose the next day Patient not taking: Reported on 06/06/2015 03/08/15   Eliezer Bottom, NP     No Known Allergies     Objective:  Physical Exam  Constitutional: She is oriented to person, place, and time. She appears well-developed and well-nourished. She is active and cooperative. No distress.  BP 122/72  mmHg  Pulse 83  Temp(Src) 98.1 F (36.7 C) (Oral)  Resp 17  Ht 5' 2.5" (1.588 m)  Wt 186 lb (84.369 kg)  BMI 33.46 kg/m2  SpO2 98%  LMP 05/25/2015  HENT:  Head: Normocephalic and atraumatic.  Right Ear: Hearing, tympanic membrane, external ear and ear canal normal.  Left Ear: Hearing, tympanic membrane, external ear and ear canal normal.  Nose: Nose normal. Right sinus exhibits no maxillary sinus tenderness and no frontal sinus tenderness. Left sinus exhibits no maxillary sinus tenderness and no frontal sinus tenderness.  Mouth/Throat: Uvula is midline, oropharynx is clear and moist and mucous membranes are normal. No oral lesions. Normal dentition. No uvula swelling.  No pain with ROM of the jaw  Eyes: Conjunctivae, EOM and lids are normal. Pupils are equal, round, and reactive to light. No scleral icterus.  Fundoscopic exam:      The right eye shows no hemorrhage and no papilledema. The right eye shows red reflex.       The left eye shows no hemorrhage and no papilledema. The left eye shows red reflex.  Neck: Normal range of motion and phonation normal. Neck supple. Muscular tenderness present. No spinous process tenderness present. No rigidity. No edema, no erythema and normal range of motion present. No thyromegaly present.    Cardiovascular: Normal rate, regular rhythm and normal heart sounds.   Pulses:      Radial pulses are 2+ on the right side, and 2+ on the left side.  Pulmonary/Chest: Effort normal and breath sounds normal.  Lymphadenopathy:       Head (right side): No tonsillar, no preauricular, no posterior auricular and no occipital adenopathy present.       Head (left side): No tonsillar, no preauricular, no posterior auricular and no occipital adenopathy present.    She has no cervical adenopathy.       Right: No supraclavicular adenopathy present.       Left: No supraclavicular adenopathy present.  Neurological: She is alert and oriented to person, place, and time.  She has normal strength. No cranial nerve deficit or sensory deficit. She displays a negative Romberg sign.  Reflex Scores:      Bicep reflexes are 2+ on the right side and 2+ on the left side.      Patellar reflexes are 2+ on the right side and 2+ on the left side. Skin: Skin is warm, dry and intact. No rash noted. No cyanosis or erythema. Nails show no clubbing.  Psychiatric: Her speech is normal and behavior is normal. Judgment and thought content normal. Her mood appears anxious. Her affect is not angry, not blunt, not labile and not inappropriate. Cognition and memory are normal. She does not exhibit a depressed mood.           Assessment & Plan:   1. Acute intractable headache, unspecified headache type Unclear etiology.  Considering migraine, sphenoid sinusitis and tension-type headache. CT scan now. If no cause identified, plan to increase use of muscle relaxers (either cyclobenzaprine or Diazepam). NSAIDS use limited due to VonWillibrand disease and pending menses. She is not willing to consider prednisone at this time. She is anxious for the scan results and a plan, because she wants to get back to work. - CT Head Wo Contrast; Future   Fara Chute, PA-C Physician Assistant-Certified [facility]

## 2015-06-16 NOTE — Patient Instructions (Addendum)
I will contact you as soon as I get the CT results.    IF you received an x-ray today, you will receive an invoice from [facility]. Please contact [facility] at [phone] with questions or concerns regarding your invoice.   IF you received labwork today, you will receive an invoice from [employer]. Please contact [facility] at [phone] with questions or concerns regarding your invoice.   Our billing staff will not be able to assist you with questions regarding bills from these companies.  You will be contacted with the lab results as soon as they are available. The fastest way to get your results is to activate your My Chart account. Instructions are located on the last page of this paperwork. If you have not heard from [place] regarding the results in 2 weeks, please contact this office.

## 2015-08-25 ENCOUNTER — Ambulatory Visit (INDEPENDENT_AMBULATORY_CARE_PROVIDER_SITE_OTHER): Payer: BLUE CROSS/BLUE SHIELD | Admitting: Urgent Care

## 2015-08-25 ENCOUNTER — Encounter: Payer: Self-pay | Admitting: Urgent Care

## 2015-08-25 VITALS — BP 122/70 | HR 95 | Temp 98.9°F | Resp 18 | Ht 62.5 in | Wt 188.0 lb

## 2015-08-25 DIAGNOSIS — L539 Erythematous condition, unspecified: Secondary | ICD-10-CM | POA: Diagnosis not present

## 2015-08-25 DIAGNOSIS — S40861A Insect bite (nonvenomous) of right upper arm, initial encounter: Secondary | ICD-10-CM | POA: Diagnosis not present

## 2015-08-25 DIAGNOSIS — T63441A Toxic effect of venom of bees, accidental (unintentional), initial encounter: Secondary | ICD-10-CM

## 2015-08-25 DIAGNOSIS — M7989 Other specified soft tissue disorders: Secondary | ICD-10-CM | POA: Diagnosis not present

## 2015-08-25 LAB — POCT CBC
GRANULOCYTE PERCENT: 67.1 % (ref 37–80)
HEMATOCRIT: 36.1 % — AB (ref 37.7–47.9)
HEMOGLOBIN: 12.8 g/dL (ref 12.2–16.2)
Lymph, poc: 2.5 (ref 0.6–3.4)
MCH: 30.7 pg (ref 27–31.2)
MCHC: 35.4 g/dL (ref 31.8–35.4)
MCV: 86.7 fL (ref 80–97)
MID (cbc): 0.8 (ref 0–0.9)
MPV: 7.1 fL (ref 0–99.8)
PLATELET COUNT, POC: 332 10*3/uL (ref 142–424)
POC GRANULOCYTE: 6.6 (ref 2–6.9)
POC LYMPH PERCENT: 25.3 %L (ref 10–50)
POC MID %: 7.6 %M (ref 0–12)
RBC: 4.16 M/uL (ref 4.04–5.48)
RDW, POC: 12.9 %
WBC: 9.9 10*3/uL (ref 4.6–10.2)

## 2015-08-25 MED ORDER — PREDNISONE 10 MG PO TABS: ORAL_TABLET | ORAL | 0 refills | Status: DC

## 2015-08-25 MED ORDER — PREDNISONE 20 MG PO TABS: ORAL_TABLET | ORAL | 0 refills | Status: DC

## 2015-08-25 NOTE — Progress Notes (Signed)
MRN: [id number] DOB: 12/02/1970  Subjective:   Cheryl Tate is a 45 y.o. female presenting for chief complaint of Insect Bite (bee sting right arm)  Reports suffering a yellow sting yesterday to right arm. Has had moderate swelling, pain of her right arm. Pain radiates up to her right shoulder, neck. Feels that her right side is flushed. Tried one benadryl, Claritin twice. Denies fever, facial swelling, throat closing, chest pain, chest tightness, shob, n/v, abdominal pain.  [name] has a current medication list which includes the following prescription(s): calcium citrate-vitamin d, desmopressin, magnesium gluconate, omega-3 fatty acids, valproic acid, vitamin c, and vitamin e. Also has No Known Allergies.  Cheryl Tate  has a past medical history of Anemia; Depression; Menorrhagia; Mood disorder ([place]); and Von Willebrand's disease ([place]). Also  has a past surgical history that includes Hysteroscopy w/D&C (08-08-2014  in [place]); Appendectomy (2006); and Dilatation & currettage/hysteroscopy with hydrothermal ablation (N/A, 11/22/2014).  Objective:   Vitals: BP 122/70 (BP Location: Right Arm, Patient Position: Sitting, Cuff Size: Large)   Pulse 95   Temp 98.9 F (37.2 C) (Oral)   Resp 18   Ht 5' 2.5" (1.588 m)   Wt 188 lb (85.3 kg)   SpO2 97%   BMI 33.84 kg/m   Physical Exam  Constitutional: She is oriented to person, place, and time. She appears well-developed and well-nourished.  HENT:  Mouth/Throat: Oropharynx is clear and moist.  Eyes: Right eye exhibits no discharge. Left eye exhibits no discharge.  Neck: Normal range of motion. Neck supple.  Cardiovascular: Normal rate, regular rhythm and intact distal pulses.  Exam reveals no gallop and no friction rub.   No murmur heard. Pulmonary/Chest: No respiratory distress. She has no wheezes. She has no rales.  Musculoskeletal:       Right upper arm: She exhibits no tenderness, no bony tenderness, no swelling, no edema, no  deformity and no laceration.       Right forearm: She exhibits tenderness and swelling (30.7cm in diameter of right forearm measured at 7cm from olecranon process versus 27.0cm for left forearm). She exhibits no bony tenderness, no edema, no deformity and no laceration.       Arms:      Right hand: She exhibits normal range of motion, no tenderness, no bony tenderness, normal two-point discrimination, normal capillary refill, no deformity, no laceration and no swelling. Normal sensation noted. Normal strength noted.  Lymphadenopathy:    She has no cervical adenopathy.  Neurological: She is alert and oriented to person, place, and time.  Skin: Skin is warm and dry. Capillary refill takes less than 2 seconds.   Results for orders placed or performed in visit on 08/25/15 (from the past 24 hour(s))  POCT CBC     Status: Abnormal   Collection Time: 08/25/15  2:08 PM  Result Value Ref Range   WBC 9.9 4.6 - 10.2 K/uL   Lymph, poc 2.5 0.6 - 3.4   POC LYMPH PERCENT 25.3 10 - 50 %L   MID (cbc) 0.8 0 - 0.9   POC MID % 7.6 0 - 12 %M   POC Granulocyte 6.6 2 - 6.9   Granulocyte percent 67.1 37 - 80 %G   RBC 4.16 4.04 - 5.48 M/uL   Hemoglobin 12.8 12.2 - 16.2 g/dL   HCT, POC 36.1 (A) 37.7 - 47.9 %   MCV 86.7 80 - 97 fL   MCH, POC 30.7 27 - 31.2 pg   MCHC 35.4 31.8 -  35.4 g/dL   RDW, POC 12.9 %   Platelet Count, POC 332 142 - 424 K/uL   MPV 7.1 0 - 99.8 fL    Assessment and Plan :   1. Allergic reaction to bee sting, accidental or unintentional, initial encounter 2. Arm swelling 3. Arm erythema - Start prednisone taper, add Zyrtec and Zantac. RTC in 1 day for f/u.   Jaynee Eagles, PA-C [facility] [phone] 08/25/2015 1:50 PM

## 2015-08-25 NOTE — Patient Instructions (Addendum)
Bee, Wasp, or Hornet Sting °Bees, wasps, and hornets are part of a family of insects that can sting people. These stings can cause pain and inflammation, but they are usually not serious. However, some people may have an allergic reaction to a sting. This can cause the symptoms to be more severe.  °SYMPTOMS  °Common symptoms of this condition include:  °· A red lump in the skin that sometimes has a tiny hole in the center. In some cases, a stinger may be in the center of the wound. °· Pain and itching at the sting site. °· Redness and swelling around the sting site. If you have an allergic reaction (localized allergic reaction), the swelling and redness may spread out from the sting site. In some cases, this reaction can continue to develop over the next 12-36 hours. °In rare cases, a person may have a severe allergic reaction (anaphylactic reaction) to a sting. Symptoms of an anaphylactic reaction may include:  °· Wheezing or difficulty breathing. °· Raised, itchy, red patches on the skin. °· Nausea or vomiting. °· Abdominal cramping. °· Diarrhea. °· Chest pain. °· Fainting. °· Redness of the face (flushing). °DIAGNOSIS  °This condition is usually diagnosed based on symptoms, medical history, and a physical exam. °TREATMENT  °Most stings can be treated with:  °· Icing to reduce swelling. °· Medicines (antihistamines) to treat itching or an allergic reaction. °· Medicines to help reduce pain. These may be medicines that you take by mouth, or medicated creams or lotions that you apply to your skin. °If you were stung by a bee, the stinger and a small sac of poison may be in the wound. This may be removed by brushing across it with a flat card, such as a credit card. Another method is to pinch the area and pull it out. These methods can help reduce the severity of the body's reaction to the sting.  °HOME CARE INSTRUCTIONS  °· Wash the sting site daily with soap and water as told by your health care provider. °· Apply  or take over-the-counter and prescription medicines only as told by your health care provider. °· If directed, apply ice to the sting area. °¨  Put ice in a plastic bag. °¨  Place a towel between your skin and the bag. °¨  Leave the ice on for 20 minutes, 2-3 times per day. °· Do not scratch the sting area. °· To lessen pain, try using a paste that is made of water and baking soda. Rub the paste on the sting area and leave it on for 5 minutes. °· If you had a severe allergic reaction to a sting, you may need: °¨  To wear a medical bracelet or necklace that lists the allergy. °¨  To learn when and how to use an anaphylaxis kit or epinephrine injection. Your family members may also need to learn this. °¨  To carry an anaphylaxis kit with you at all times. °SEEK MEDICAL CARE IF:  °· Your symptoms do not get better in 2-3 days. °· You have redness, swelling, or pain that spreads beyond the area of the sting. °· You have a fever. °SEEK IMMEDIATE MEDICAL CARE IF:  °You have symptoms of a severe allergic reaction. These include:  °· Wheezing or difficulty breathing. °· Chest pain. °· Light-headedness or fainting. °· Itchy, raised, red patches on the skin. °· Nausea or vomiting. °· Abdominal cramping. °· Diarrhea. °  °This information is not intended to replace advice given to you by your health care provider.   Make sure you discuss any questions you have with your health care provider.   Document Released: 01/07/2005 Document Revised: 09/28/2014 Document Reviewed: 05/25/2014 Elsevier Interactive Patient Education 2016 [employer].     IF you received an x-ray today, you will receive an invoice from [facility]. Please contact [facility] at [phone] with questions or concerns regarding your invoice.   IF you received labwork today, you will receive an invoice from [employer]. Please contact [facility] at [phone] with questions or concerns regarding your  invoice.   Our billing staff will not be able to assist you with questions regarding bills from these companies.  You will be contacted with the lab results as soon as they are available. The fastest way to get your results is to activate your My Chart account. Instructions are located on the last page of this paperwork. If you have not heard from [place] regarding the results in 2 weeks, please contact this office.

## 2015-08-26 ENCOUNTER — Encounter: Payer: Self-pay | Admitting: Urgent Care

## 2015-08-26 ENCOUNTER — Ambulatory Visit (INDEPENDENT_AMBULATORY_CARE_PROVIDER_SITE_OTHER): Payer: BLUE CROSS/BLUE SHIELD | Admitting: Urgent Care

## 2015-08-26 VITALS — BP 120/80 | HR 82 | Temp 98.3°F | Resp 18 | Ht 62.0 in | Wt 190.0 lb

## 2015-08-26 DIAGNOSIS — L03113 Cellulitis of right upper limb: Secondary | ICD-10-CM

## 2015-08-26 DIAGNOSIS — M7989 Other specified soft tissue disorders: Secondary | ICD-10-CM

## 2015-08-26 DIAGNOSIS — T63441A Toxic effect of venom of bees, accidental (unintentional), initial encounter: Secondary | ICD-10-CM

## 2015-08-26 DIAGNOSIS — S40861D Insect bite (nonvenomous) of right upper arm, subsequent encounter: Secondary | ICD-10-CM | POA: Diagnosis not present

## 2015-08-26 DIAGNOSIS — T63441D Toxic effect of venom of bees, accidental (unintentional), subsequent encounter: Secondary | ICD-10-CM

## 2015-08-26 DIAGNOSIS — L539 Erythematous condition, unspecified: Secondary | ICD-10-CM

## 2015-08-26 LAB — POCT CBC
Granulocyte percent: 79.1 %G (ref 37–80)
HEMATOCRIT: 34 % — AB (ref 37.7–47.9)
HEMOGLOBIN: 11.9 g/dL — AB (ref 12.2–16.2)
LYMPH, POC: 2 (ref 0.6–3.4)
MCH, POC: 30.7 pg (ref 27–31.2)
MCHC: 35 g/dL (ref 31.8–35.4)
MCV: 87.7 fL (ref 80–97)
MID (CBC): 0.4 (ref 0–0.9)
MPV: 7.2 fL (ref 0–99.8)
POC GRANULOCYTE: 9.3 — AB (ref 2–6.9)
POC LYMPH %: 17.3 % (ref 10–50)
POC MID %: 3.6 % (ref 0–12)
Platelet Count, POC: 325 10*3/uL (ref 142–424)
RBC: 3.87 M/uL — AB (ref 4.04–5.48)
RDW, POC: 12.6 %
WBC: 11.7 10*3/uL — AB (ref 4.6–10.2)

## 2015-08-26 MED ORDER — CEPHALEXIN 500 MG PO CAPS: 500.0000 mg | ORAL_CAPSULE | Freq: Two times a day (BID) | ORAL | 0 refills | Status: DC

## 2015-08-26 NOTE — Patient Instructions (Addendum)
Bee, Wasp, or Hornet Sting °Bees, wasps, and hornets are part of a family of insects that can sting people. These stings can cause pain and inflammation, but they are usually not serious. However, some people may have an allergic reaction to a sting. This can cause the symptoms to be more severe.  °SYMPTOMS  °Common symptoms of this condition include:  °· A red lump in the skin that sometimes has a tiny hole in the center. In some cases, a stinger may be in the center of the wound. °· Pain and itching at the sting site. °· Redness and swelling around the sting site. If you have an allergic reaction (localized allergic reaction), the swelling and redness may spread out from the sting site. In some cases, this reaction can continue to develop over the next 12-36 hours. °In rare cases, a person may have a severe allergic reaction (anaphylactic reaction) to a sting. Symptoms of an anaphylactic reaction may include:  °· Wheezing or difficulty breathing. °· Raised, itchy, red patches on the skin. °· Nausea or vomiting. °· Abdominal cramping. °· Diarrhea. °· Chest pain. °· Fainting. °· Redness of the face (flushing). °DIAGNOSIS  °This condition is usually diagnosed based on symptoms, medical history, and a physical exam. °TREATMENT  °Most stings can be treated with:  °· Icing to reduce swelling. °· Medicines (antihistamines) to treat itching or an allergic reaction. °· Medicines to help reduce pain. These may be medicines that you take by mouth, or medicated creams or lotions that you apply to your skin. °If you were stung by a bee, the stinger and a small sac of poison may be in the wound. This may be removed by brushing across it with a flat card, such as a credit card. Another method is to pinch the area and pull it out. These methods can help reduce the severity of the body's reaction to the sting.  °HOME CARE INSTRUCTIONS  °· Wash the sting site daily with soap and water as told by your health care provider. °· Apply  or take over-the-counter and prescription medicines only as told by your health care provider. °· If directed, apply ice to the sting area. °¨  Put ice in a plastic bag. °¨  Place a towel between your skin and the bag. °¨  Leave the ice on for 20 minutes, 2-3 times per day. °· Do not scratch the sting area. °· To lessen pain, try using a paste that is made of water and baking soda. Rub the paste on the sting area and leave it on for 5 minutes. °· If you had a severe allergic reaction to a sting, you may need: °¨  To wear a medical bracelet or necklace that lists the allergy. °¨  To learn when and how to use an anaphylaxis kit or epinephrine injection. Your family members may also need to learn this. °¨  To carry an anaphylaxis kit with you at all times. °SEEK MEDICAL CARE IF:  °· Your symptoms do not get better in 2-3 days. °· You have redness, swelling, or pain that spreads beyond the area of the sting. °· You have a fever. °SEEK IMMEDIATE MEDICAL CARE IF:  °You have symptoms of a severe allergic reaction. These include:  °· Wheezing or difficulty breathing. °· Chest pain. °· Light-headedness or fainting. °· Itchy, raised, red patches on the skin. °· Nausea or vomiting. °· Abdominal cramping. °· Diarrhea. °  °This information is not intended to replace advice given to you by your health care provider.   Make sure you discuss any questions you have with your health care provider.   Document Released: 01/07/2005 Document Revised: 09/28/2014 Document Reviewed: 05/25/2014 Elsevier Interactive Patient Education 2016 Elsevier Inc.     IF you received an x-ray today, you will receive an invoice from [facility]. Please contact [facility] at [phone] with questions or concerns regarding your invoice.   IF you received labwork today, you will receive an invoice from [facility]. Please contact [facility] at [phone] with questions or concerns regarding your  invoice.   Our billing staff will not be able to assist you with questions regarding bills from these companies.  You will be contacted with the lab results as soon as they are available. The fastest way to get your results is to activate your My Chart account. Instructions are located on the last page of this paperwork. If you have not heard from us regarding the results in 2 weeks, please contact this office.     

## 2015-08-26 NOTE — Progress Notes (Signed)
    MRN: [id number] DOB: 1970/05/28  Subjective:   Cheryl Tate is a 45 y.o. female presenting for follow up on bee sting.   She was initially seen on 08/25/2015, start on short/rapid steroid taper for large local reaction of her right arm. Today, she reports significant improvement in swelling, redness, blisters, arm pain. She has not started antihistamines.   Kariya has a current medication list which includes the following prescription(s): calcium citrate-vitamin d, desmopressin, magnesium gluconate, omega-3 fatty acids, prednisone, prednisone, valproic acid, vitamin c, and vitamin e. Also has No Known Allergies.  Cheryl Tate  has a past medical history of Anemia; Depression; Menorrhagia; Mood disorder ([place]); and Von Willebrand's disease ([place]). Also  has a past surgical history that includes Hysteroscopy w/D&C (08-08-2014  in [place]); Appendectomy (2006); and Dilatation & currettage/hysteroscopy with hydrothermal ablation (N/A, 11/22/2014).  Objective:   Vitals: BP 120/80 (BP Location: Right Arm, Patient Position: Sitting, Cuff Size: Large)   Pulse 82   Temp 98.3 F (36.8 C) (Oral)   Resp 18   Ht 5' 2"  (1.575 m)   Wt 190 lb (86.2 kg)   LMP 08/05/2015   SpO2 98%   BMI 34.75 kg/m   Physical Exam  Constitutional: She is oriented to person, place, and time. She appears well-developed and well-nourished.  Cardiovascular: Normal rate.   Pulmonary/Chest: Effort normal.  Musculoskeletal:       Right wrist: She exhibits normal range of motion, no tenderness, no bony tenderness, no swelling, no effusion, no crepitus, no deformity and no laceration.       Right forearm: She exhibits tenderness (over right forearm up to her wrist) and swelling (29.5cm over right forearm 7cm from olecranon process compared to 27.0cm for left forearm). She exhibits no edema, no deformity and no laceration.  Neurological: She is alert and oriented to person, place, and time.   Results for orders placed or  performed in visit on 08/26/15 (from the past 24 hour(s))  POCT CBC     Status: Abnormal   Collection Time: 08/26/15  1:53 PM  Result Value Ref Range   WBC 11.7 (A) 4.6 - 10.2 K/uL   Lymph, poc 2.0 0.6 - 3.4   POC LYMPH PERCENT 17.3 10 - 50 %L   MID (cbc) 0.4 0 - 0.9   POC MID % 3.6 0 - 12 %M   POC Granulocyte 9.3 (A) 2 - 6.9   Granulocyte percent 79.1 37 - 80 %G   RBC 3.87 (A) 4.04 - 5.48 M/uL   Hemoglobin 11.9 (A) 12.2 - 16.2 g/dL   HCT, POC 34.0 (A) 37.7 - 47.9 %   MCV 87.7 80 - 97 fL   MCH, POC 30.7 27 - 31.2 pg   MCHC 35.0 31.8 - 35.4 g/dL   RDW, POC 12.6 %   Platelet Count, POC 325 142 - 424 K/uL   MPV 7.2 0 - 99.8 fL   Assessment and Plan :   1. Allergic reaction to bee sting, accidental or unintentional, subsequent encounter 2. Arm swelling 3. Arm erythema 4. Cellulitis of right upper extremity - Significant improvement, however given that swelling is only mildly improved, increase in wbc, ongoing warmth of her forearm, I will have patient start Keflex twice daily for 7 days.  Jaynee Eagles, PA-C [facility] [phone] 08/26/2015 1:42 PM

## 2019-03-25 ENCOUNTER — Encounter: Payer: Self-pay | Admitting: Family Medicine

## 2019-03-25 ENCOUNTER — Ambulatory Visit: Payer: Self-pay

## 2019-03-25 ENCOUNTER — Ambulatory Visit (INDEPENDENT_AMBULATORY_CARE_PROVIDER_SITE_OTHER): Payer: Self-pay | Admitting: Family Medicine

## 2019-03-25 ENCOUNTER — Other Ambulatory Visit: Payer: Self-pay

## 2019-03-25 DIAGNOSIS — M25511 Pain in right shoulder: Secondary | ICD-10-CM

## 2019-03-25 DIAGNOSIS — G8929 Other chronic pain: Secondary | ICD-10-CM

## 2019-03-25 DIAGNOSIS — M542 Cervicalgia: Secondary | ICD-10-CM

## 2019-03-25 DIAGNOSIS — M25512 Pain in left shoulder: Secondary | ICD-10-CM

## 2019-03-25 MED ORDER — TIZANIDINE HCL 2 MG PO TABS: 2.0000 mg | ORAL_TABLET | Freq: Every evening | ORAL | 1 refills | Status: DC | PRN

## 2019-03-25 MED ORDER — GLUCOSAMINE SULFATE 1000 MG PO CAPS: 1.0000 | ORAL_CAPSULE | Freq: Two times a day (BID) | ORAL | 3 refills | Status: DC

## 2019-03-25 MED ORDER — VITAMIN D-3 125 MCG (5000 UT) PO TABS: 1.0000 | ORAL_TABLET | Freq: Every day | ORAL | 3 refills | Status: DC
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
## 2019-04-19 ENCOUNTER — Encounter: Payer: Self-pay | Admitting: Family Medicine

## 2020-02-20 ENCOUNTER — Other Ambulatory Visit: Payer: Self-pay | Admitting: Family Medicine

## 2020-06-15 LAB — COLOGUARD: COLOGUARD: NEGATIVE

## 2020-06-15 LAB — EXTERNAL GENERIC LAB PROCEDURE: COLOGUARD: NEGATIVE

## 2020-06-20 ENCOUNTER — Other Ambulatory Visit: Payer: Self-pay

## 2020-06-20 ENCOUNTER — Ambulatory Visit (INDEPENDENT_AMBULATORY_CARE_PROVIDER_SITE_OTHER): Payer: Commercial Indemnity | Admitting: Family Medicine

## 2020-06-20 VITALS — Ht 62.5 in | Wt 163.8 lb

## 2020-06-20 DIAGNOSIS — Z7689 Persons encountering health services in other specified circumstances: Secondary | ICD-10-CM

## 2020-06-20 NOTE — Progress Notes (Signed)
Would like to cancel since I'm leaving in August.

## 2020-12-19 ENCOUNTER — Ambulatory Visit
Admission: RE | Admit: 2020-12-19 | Discharge: 2020-12-19 | Disposition: A | Discharge status: Home / Self Care | Payer: No Typology Code available for payment source | Source: Ambulatory Visit | Attending: Family Medicine | Admitting: Family Medicine

## 2020-12-19 ENCOUNTER — Other Ambulatory Visit: Payer: Self-pay | Admitting: Family Medicine

## 2020-12-19 DIAGNOSIS — S8001XA Contusion of right knee, initial encounter: Secondary | ICD-10-CM

## 2020-12-19 DIAGNOSIS — W19XXXA Unspecified fall, initial encounter: Secondary | ICD-10-CM

## 2021-03-08 ENCOUNTER — Ambulatory Visit (INDEPENDENT_AMBULATORY_CARE_PROVIDER_SITE_OTHER): Payer: No Typology Code available for payment source | Admitting: Orthopedic Surgery

## 2021-03-08 ENCOUNTER — Telehealth: Payer: Self-pay

## 2021-03-08 ENCOUNTER — Other Ambulatory Visit: Payer: Self-pay

## 2021-03-08 DIAGNOSIS — M1711 Unilateral primary osteoarthritis, right knee: Secondary | ICD-10-CM | POA: Diagnosis not present

## 2021-03-08 NOTE — Telephone Encounter (Signed)
Auth needed for right knee gel injection.  However, patient has [employer] accidental insurance policy. She would like for [place] to check with them to see if they will cover it before running through [facility]. She said her policy number for the accidental policy is [id number] and contact is [phone]

## 2021-03-09 ENCOUNTER — Encounter: Payer: Self-pay | Admitting: Orthopedic Surgery

## 2021-03-09 NOTE — Telephone Encounter (Signed)
Noted. Will try [employer] first.

## 2021-03-09 NOTE — Progress Notes (Addendum)
Office Visit Note   Patient: Cheryl Tate           Date of Birth: 27-Aug-1970           MRN: [id number] Visit Date: 03/08/2021 Requested by: No referring provider defined for this encounter. PCP: Patient, No Pcp Per (Inactive)  Subjective: Chief Complaint  Patient presents with   Right Knee - Pain    HPI: Benjamin is a 51 year old patient with right knee pain.  She had a mild amount of pain in the knee for years but the knee pain became acutely worse 4 months ago,11/20/2020, when a large dog ran into the knee and she sustained a valgus type injury.  This was significant impact on the right knee.  Took her about 3 minutes to actually get up and the knee developed immediate swelling.  The pain currently wakes her from sleep.  She wants to walk about 3 miles a day but cannot do that.  Her walking endurance has been significantly limited since her knee pain became severe 4 months ago.  She does try to do yoga at home.  Left knee has no problem.  Ketoprofen helped.  She does have von Willebrand's disease.  She had an appendectomy years ago but does not remember exactly the perioperative management of her von Willebrand's disease.  Cortisone injection 3 weeks ago only helped for 2 days.  She travels a lot between [place] and [place].  She does have a hematologist in [facility].  She is very keen on getting back to a higher level of activity.  She has a lot of travel and plans over the next 6 months.              ROS: All systems reviewed are negative as they relate to the chief complaint within the history of present illness.  Patient denies  fevers or chills.   Assessment & Plan: Visit Diagnoses:  1. Arthritis of right knee     Plan: Impression is right knee arthritis exacerbated by dog injury 4 months ago.  Prior to that she was actually functioning reasonably well.  Since that time her knee has spiraled into a cycle of significant pain.  She has had a cortisone injection 3 weeks  ago which did not help.  We discussed how she may eventually need knee replacement but gel injection may give her enough relief that she could complete all of these activities that she has over the next 6 months.  We will try a gel injection to see how it helps.  Continue with nonweightbearing quad strengthening exercises.  This patient is diagnosed with osteoarthritis of the knee(s).    Radiographs show evidence of joint space narrowing, osteophytes, subchondral sclerosis and/or subchondral cysts.  This patient has knee pain which interferes with functional and activities of daily living.    This patient has experienced inadequate response, adverse effects and/or intolerance with conservative treatments such as acetaminophen, NSAIDS, topical creams, physical therapy or regular exercise, knee bracing and/or weight loss.   This patient has experienced inadequate response or has a contraindication to intra articular steroid injections for at least 3 months.   This patient is not scheduled to have a total knee replacement within 6 months of starting treatment with viscosupplementation.   Follow-Up Instructions: Return in about 3 weeks (around 03/29/2021).   Orders:  No orders of the defined types were placed in this encounter.  No orders of the defined types were placed in this  encounter.     Procedures: No procedures performed   Clinical Data: No additional findings.  Objective: Vital Signs: There were no vitals taken for this visit.  Physical Exam:   Constitutional: Patient appears well-developed HEENT:  Head: Normocephalic Eyes:EOM are normal Neck: Normal range of motion Cardiovascular: Normal rate Pulmonary/chest: Effort normal Neurologic: Patient is alert Skin: Skin is warm Psychiatric: Patient has normal mood and affect   Ortho Exam: Ortho exam demonstrates normal gait and alignment slightly antalgic to the right.  Pedal pulses palpable.  She has about a 3 to 5 degree  flexion contracture in the right knee compared to the left.  Has medial greater than lateral joint line tenderness with mild patellofemoral crepitus.  No groin pain with internal/external rotation of the leg.  No other masses lymphadenopathy or skin changes noted in that knee region.  Flexion is to 120.  Specialty Comments:  No specialty comments available.  Imaging: No results found.   PMFS History: Patient Active Problem List   Diagnosis Date Noted   BMI 33.0-33.9,adult 06/16/2015   Von Willebrand disease 06/13/2015   Depression 12/29/2014   Rhinitis, allergic 12/28/2014   Past Medical History:  Diagnosis Date   Anemia    Depression    Menorrhagia    Mood disorder ([place])    Von Willebrand's disease    followed by hemotologist in [place]    Family History  Problem Relation Age of Onset   Mental retardation Mother    Mental retardation Brother    Mental retardation Maternal Grandmother     Past Surgical History:  Procedure Laterality Date   APPENDECTOMY  2006   DILITATION & CURRETTAGE/HYSTROSCOPY WITH HYDROTHERMAL ABLATION N/A 11/22/2014   Procedure: HYSTEROSCOPY WITH HYDROTHERMAL ABLATION;  Surgeon: Dian Queen, MD;  Location: [facility];  Service: Gynecology;  Laterality: N/A;   HYSTEROSCOPY WITH D & C  08-08-2014  in [place]   Social History   Occupational History   Occupation: Biomedical scientist, Tree surgeon  Tobacco Use   Smoking status: Every Day    Packs/day: 0.20    Years: 25.00    Pack years: 5.00    Types: Cigarettes   Smokeless tobacco: Never   Tobacco comments:    2 cig daily  Substance and Sexual Activity   Alcohol use: Yes    Alcohol/week: 7.0 standard drinks    Types: 7 Glasses of wine per week    Comment: one wine daily   Drug use: No   Sexual activity: Not on file

## 2021-03-15 ENCOUNTER — Telehealth: Payer: Self-pay

## 2021-03-15 NOTE — Telephone Encounter (Signed)
VOB submitted for  Durolane, right knee. BV pending.

## 2021-03-20 ENCOUNTER — Telehealth: Payer: Self-pay | Admitting: Orthopedic Surgery

## 2021-03-20 NOTE — Telephone Encounter (Signed)
Noted  

## 2021-03-20 NOTE — Telephone Encounter (Signed)
Patient left voice mail message requesting to be set up for knee surgery.  I called patient and she would like for Dr. Marlou Sa to hold a date of June 13th for knee replacement.  Please provide surgery sheet if surgery is in order.  Patient has decided to go with a PRP injection for now with Dr. Eunice Blase and is scheduled with him tomorrow 03-21-21 at 2pm.   Please call patient if you have any questions regarding this arrangement.  Cb  [phone]

## 2021-03-20 NOTE — Telephone Encounter (Signed)
Holding for Wednesday to ask Dr Marlou Sa about surgery sheet.

## 2021-03-20 NOTE — Telephone Encounter (Signed)
Patient called asked for a call back concerning her last visit with Dr. Marlou Sa. Patient said she has decided not to have the gel injection but, wanted to go ahead and schedule surgery. The number to contact patient is [phone]

## 2021-03-20 NOTE — Telephone Encounter (Signed)
Had submitted through [employer] for gel injection first, per patient's request.  Per VOB, under this policy number [id number], patient only has accidental coverage.

## 2021-03-21 NOTE — Telephone Encounter (Signed)
Done thx

## 2021-03-27 ENCOUNTER — Telehealth: Payer: Self-pay | Admitting: Family

## 2021-03-27 NOTE — Telephone Encounter (Signed)
Called to schedule per 3/7 sch msg, left voicemail  ?

## 2021-05-11 ENCOUNTER — Other Ambulatory Visit: Payer: Self-pay | Admitting: Family

## 2021-05-11 DIAGNOSIS — D6800 Von Willebrand disease, unspecified: Secondary | ICD-10-CM

## 2021-05-14 ENCOUNTER — Encounter: Payer: Self-pay | Admitting: Family

## 2021-05-14 ENCOUNTER — Inpatient Hospital Stay (HOSPITAL_BASED_OUTPATIENT_CLINIC_OR_DEPARTMENT_OTHER): Payer: No Typology Code available for payment source | Admitting: Family

## 2021-05-14 ENCOUNTER — Inpatient Hospital Stay: Payer: No Typology Code available for payment source | Attending: Family

## 2021-05-14 VITALS — BP 115/71 | HR 72 | Temp 98.5°F | Resp 17 | Wt 173.1 lb

## 2021-05-14 DIAGNOSIS — D6800 Von Willebrand disease, unspecified: Secondary | ICD-10-CM

## 2021-05-14 DIAGNOSIS — N920 Excessive and frequent menstruation with regular cycle: Secondary | ICD-10-CM | POA: Insufficient documentation

## 2021-05-14 DIAGNOSIS — Z91030 Bee allergy status: Secondary | ICD-10-CM | POA: Diagnosis not present

## 2021-05-14 DIAGNOSIS — Z79899 Other long term (current) drug therapy: Secondary | ICD-10-CM | POA: Insufficient documentation

## 2021-05-14 DIAGNOSIS — Z886 Allergy status to analgesic agent status: Secondary | ICD-10-CM | POA: Insufficient documentation

## 2021-05-14 LAB — CBC WITH DIFFERENTIAL (CANCER CENTER ONLY)
Abs Immature Granulocytes: 0.03 10*3/uL (ref 0.00–0.07)
Basophils Absolute: 0 10*3/uL (ref 0.0–0.1)
Basophils Relative: 1 %
Eosinophils Absolute: 0.2 10*3/uL (ref 0.0–0.5)
Eosinophils Relative: 3 %
HCT: 35.9 % — ABNORMAL LOW (ref 36.0–46.0)
Hemoglobin: 12 g/dL (ref 12.0–15.0)
Immature Granulocytes: 1 %
Lymphocytes Relative: 39 %
Lymphs Abs: 2.6 10*3/uL (ref 0.7–4.0)
MCH: 30.6 pg (ref 26.0–34.0)
MCHC: 33.4 g/dL (ref 30.0–36.0)
MCV: 91.6 fL (ref 80.0–100.0)
Monocytes Absolute: 0.6 10*3/uL (ref 0.1–1.0)
Monocytes Relative: 10 %
Neutro Abs: 3.1 10*3/uL (ref 1.7–7.7)
Neutrophils Relative %: 46 %
Platelet Count: 309 10*3/uL (ref 150–400)
RBC: 3.92 MIL/uL (ref 3.87–5.11)
RDW: 12.1 % (ref 11.5–15.5)
WBC Count: 6.6 10*3/uL (ref 4.0–10.5)
nRBC: 0 % (ref 0.0–0.2)

## 2021-05-14 LAB — CMP (CANCER CENTER ONLY)
ALT: 13 U/L (ref 0–44)
AST: 14 U/L — ABNORMAL LOW (ref 15–41)
Albumin: 4.6 g/dL (ref 3.5–5.0)
Alkaline Phosphatase: 43 U/L (ref 38–126)
Anion gap: 8 (ref 5–15)
BUN: 23 mg/dL — ABNORMAL HIGH (ref 6–20)
CO2: 29 mmol/L (ref 22–32)
Calcium: 9.6 mg/dL (ref 8.9–10.3)
Chloride: 102 mmol/L (ref 98–111)
Creatinine: 0.65 mg/dL (ref 0.44–1.00)
GFR, Estimated: >60 mL/min (ref >60)
Glucose, Bld: 76 mg/dL (ref 70–99)
Potassium: 4.3 mmol/L (ref 3.5–5.1)
Sodium: 139 mmol/L (ref 135–145)
Total Bilirubin: 0.2 mg/dL — ABNORMAL LOW (ref 0.3–1.2)
Total Protein: 7.3 g/dL (ref 6.5–8.1)

## 2021-05-14 LAB — PROTIME-INR
INR: 0.9 (ref 0.8–1.2)
Prothrombin Time: 12.4 seconds (ref 11.4–15.2)

## 2021-05-14 LAB — APTT: aPTT: 29 seconds (ref 24–36)

## 2021-05-14 NOTE — Progress Notes (Signed)
?Hematology and Oncology Follow Up Visit ? ?Cheryl Tate ?[id number] ?[birth year] 51 y.o. ?05/14/2021 ? ? ?Principle Diagnosis:  ?Von Willebrand's  ?Menorrhagia  ? ?Current Therapy:   ?Observation ?  ?Interim History:  Cheryl Tate is here today to re-establish care prior to having a right knee arthroplasty on 05/22/2021 with Dr. Marlou Sa.  ?She states that since having a uterine ablation and being on Prometrium and Estrace she has not had any uterine bleeding.  ?No recent issue with nose bleeds or other blood loss.  ?She does note that she bruises easily. No petechiae.  ?No fever, chills, n/v, cough, rash, dizziness, SOB, chest pain, palpitations, abdominal pain or changes in bowel or bladder habits at this time.  ?No swelling, tenderness, numbness or tingling in her extremities.  ?No falls or syncope.  ?She has maintained a good appetite and is staying well hydrated. Her weight is 163 lbs.  ? ?ECOG Performance Status: 1 - Symptomatic but completely ambulatory ? ?Medications:  ?Allergies as of 05/14/2021   ? ?   Reactions  ? Bee Venom Swelling  ? Ibuprofen   ? ?  ? ?  ?Medication List  ?  ? ?  ? Accurate as of May 14, 2021  8:54 AM. If you have any questions, ask your nurse or doctor.  ?  ?  ? ?  ? ?CALCIUM + D PO ?Take by mouth daily. ?  ?CALCIUM PO ?Take 1,100 mg by mouth daily at 6 (six) AM. Raw calcium with other vitamins ?  ?divalproex 250 MG DR tablet ?Commonly known as: DEPAKOTE ?Depakote 250 mg tablet,delayed release ? Take 1 tablet twice a day by oral route. ?  ?EPINEPHrine 0.3 mg/0.3 mL Soaj injection ?Commonly known as: EPI-PEN ?Inject into the muscle. ?  ?estradiol 0.5 MG tablet ?Commonly known as: ESTRACE ?Take 0.5 mg by mouth daily. ?  ?Glucosamine Sulfate 1000 MG Caps ?Take 1 capsule (1,000 mg total) by mouth 2 (two) times daily. ?  ?IRON PO ?Take 1 tablet by mouth daily at 6 (six) AM. [employer] Iron ?  ?levocetirizine 5 MG tablet ?Commonly known as: XYZAL ?Take 5 mg by mouth every evening. ?   ?magnesium gluconate 30 MG tablet ?Commonly known as: MAGONATE ?Take 30 mg by mouth 2 (two) times daily. ?  ?Magnesium Glycinate 100 MG Caps ?Take 300 mg by mouth daily at 6 (six) AM. Triple magnesium complex ?  ?progesterone 200 MG capsule ?Commonly known as: PROMETRIUM ?Take 200 mg by mouth daily. ?  ?tiZANidine 2 MG tablet ?Commonly known as: ZANAFLEX ?Take 1-2 tablets (2-4 mg total) by mouth at bedtime as needed for muscle spasms. ?  ?TURMERIC PO ?Take 1,000 mg by mouth daily. ?  ?valproic acid 250 MG capsule ?Commonly known as: DEPAKENE ?Take 500 mg by mouth Nightly. ?  ?VITAMIN B-12 PO ?Take by mouth. ?  ?vitamin C 500 MG tablet ?Commonly known as: ASCORBIC ACID ?Take 1,000 mg by mouth daily. ?  ?Vitamin D3 125 MCG (5000 UT) Caps ?TAKE ONE CAPSULE BY MOUTH DAILY ?  ?vitamin E 180 MG (400 UNITS) capsule ?Take 400 Units by mouth daily. ?  ? ?  ? ? ?Allergies:  ?Allergies  ?Allergen Reactions  ? Bee Venom Swelling  ? Ibuprofen   ? ? ?Past Medical History, Surgical history, Social history, and Family History were reviewed and updated. ? ?Review of Systems: ?All other 10 point review of systems is negative.  ? ?Physical Exam: ? vitals were not taken for this visit.  ? ?  Wt Readings from Last 3 Encounters:  ?06/20/20 163 lb 12.8 oz (74.3 kg)  ?08/26/15 190 lb (86.2 kg)  ?08/25/15 188 lb (85.3 kg)  ? ? ?Ocular: Sclerae unicteric, pupils equal, round and reactive to light ?Ear-nose-throat: Oropharynx clear, dentition fair ?Lymphatic: No cervical or supraclavicular adenopathy ?Lungs no rales or rhonchi, good excursion bilaterally ?Heart regular rate and rhythm, no murmur appreciated ?Abd soft, nontender, positive bowel sounds ?MSK no focal spinal tenderness, no joint edema ?Neuro: non-focal, well-oriented, appropriate affect ?Breasts: Deferred  ? ?Lab Results  ?Component Value Date  ? WBC 6.6 05/14/2021  ? HGB 12.0 05/14/2021  ? HCT 35.9 (L) 05/14/2021  ? MCV 91.6 05/14/2021  ? PLT 309 05/14/2021  ? ?Lab Results   ?Component Value Date  ? IRON 113 06/11/2014  ? TIBC 396 06/11/2014  ? UIBC 283 06/11/2014  ? IRONPCTSAT 29 06/11/2014  ? ?Lab Results  ?Component Value Date  ? RBC 3.92 05/14/2021  ? ?No results found for: KPAFRELGTCHN, LAMBDASER, KAPLAMBRATIO ?No results found for: IGGSERUM, IGA, IGMSERUM ?No results found for: TOTALPROTELP, ALBUMINELP, A1GS, A2GS, BETS, BETA2SER, GAMS, MSPIKE, SPEI ?  Chemistry   ?   ?Component Value Date/Time  ? NA 136 12/28/2014 1034  ? K 4.4 12/28/2014 1034  ? CL 102 12/28/2014 1034  ? CO2 22 12/28/2014 1034  ? BUN 13 12/28/2014 1034  ? CREATININE 0.44 (L) 12/28/2014 1034  ?    ?Component Value Date/Time  ? CALCIUM 9.0 12/28/2014 1034  ? ALKPHOS 45 12/28/2014 1034  ? AST 18 12/28/2014 1034  ? ALT 19 12/28/2014 1034  ? BILITOT 0.2 12/28/2014 1034  ?  ? ? ? ?Impression and Plan:  Cheryl Tate is a very pleasant 51 yo white female with history of Von Willebrand's and menorrhagia.  ?WVB panel pending.  ?She is scheduled for right knee arthroplasty on 05/22/2021.  ?PTT and PT/INR were normal.  ?We will see what her VWB panel reveals and then contact Dr. Marlou Sa with instructions on DDAVP infusion therapy prior to and possibly after.  ?She will continue to follow-up as needed.  ? ?Lottie Dawson, NP ?4/24/20238:54 AM  ?

## 2021-05-17 LAB — VON WILLEBRAND PANEL
Coagulation Factor VIII: 58 % (ref 56–140)
Ristocetin Co-factor, Plasma: 34 % — ABNORMAL LOW (ref 50–200)
Von Willebrand Antigen, Plasma: 42 % — ABNORMAL LOW (ref 50–200)

## 2021-05-17 LAB — COAG STUDIES INTERP REPORT

## 2021-05-17 NOTE — Progress Notes (Signed)
Surgical Instructions ? ? ? Your procedure is scheduled on 05/22/21. ? Report to [facility] Main Entrance "A" at 5:30 A.M., then check in with the Admitting office. ? Call this number if you have problems the morning of surgery: ? [phone] ? ? If you have any questions prior to your surgery date call [phone]: Open Monday-Friday 8am-4pm ? ? ? Remember: ? Do not eat after midnight the night before your surgery ? ?You may drink clear liquids until 4:30am the morning of your surgery.   ?Clear liquids allowed are: Water, Non-Citrus Juices (without pulp), Carbonated Beverages, Clear Tea, Black Coffee ONLY (NO MILK, CREAM OR POWDERED CREAMER of any kind), and Gatorade ? ?Patient Instructions ? ?The night before surgery:  ?No food after midnight. ONLY clear liquids after midnight ? ?The day of surgery (if you do NOT have diabetes):  ?Drink ONE (1) Pre-Surgery Clear Ensure by 4:30am the morning of surgery. Drink in one sitting. Do not sip.  ?This drink was given to you during your hospital  ?pre-op appointment visit. ?Nothing else to drink after completing the  ?Pre-Surgery Clear Ensure. ? ? ? ?       If you have questions, please contact your surgeon?s office. ? ?  ? Take these medicines the morning of surgery with A SIP OF WATER:  ?estradiol (ESTRACE) ? ?IF NEEDED: ?acetaminophen (TYLENOL)  ?fluticasone (FLONASE)  ?levocetirizine (XYZAL) ?Naphazoline-Pheniramine (VISINE-A OP) ? ?As of today, STOP taking any Aspirin (unless otherwise instructed by your surgeon) Aleve, Naproxen, Ibuprofen, Motrin, Advil, Goody's, BC's, all herbal medications, fish oil, and all vitamins. ? ?         ?Do not wear jewelry or makeup ?Do not wear lotions, powders, perfumes/colognes, or deodorant. ?Do not shave 48 hours prior to surgery.   ?Do not bring valuables to the hospital. ?Do not wear nail polish, gel polish, artificial nails, or any other type of covering on natural nails (fingers and toes) ?If you have artificial nails or gel  coating that need to be removed by a nail salon, please have this removed prior to surgery. Artificial nails or gel coating may interfere with anesthesia's ability to adequately monitor your vital signs. ? ?[facility] is not responsible for any belongings or valuables. .  ? ?Do NOT Smoke (Tobacco/Vaping)  24 hours prior to your procedure ? ?If you use a CPAP at night, you may bring your mask for your overnight stay. ?  ?Contacts, glasses, hearing aids, dentures or partials may not be worn into surgery, please bring cases for these belongings ?  ?For patients admitted to the hospital, discharge time will be determined by your treatment team. ?  ?Patients discharged the day of surgery will not be allowed to drive home, and someone needs to stay with them for 24 hours. ? ? ?SURGICAL WAITING ROOM VISITATION ?Patients having surgery or a procedure in a hospital may have two support people. ?Children under the age of 77 must have an adult with them who is not the patient. ?They may stay in the waiting area during the procedure and may switch out with other visitors. If the patient needs to stay at the hospital during part of their recovery, the visitor guidelines for inpatient rooms apply. ? ?Please refer to the [facility] website for the visitor guidelines for Inpatients (after your surgery is over and you are in a regular room).  ? ? ? ? ? ?Special instructions:   ? ?Oral Hygiene is also important to reduce your risk  of infection.  Remember - BRUSH YOUR TEETH THE MORNING OF SURGERY WITH YOUR REGULAR TOOTHPASTE ? ? ?[facility]- Preparing For Surgery ? ?Before surgery, you can play an important role. Because skin is not sterile, your skin needs to be as free of germs as possible. You can reduce the number of germs on your skin by washing with CHG (chlorahexidine gluconate) Soap before surgery.  CHG is an antiseptic cleaner which kills germs and bonds with the skin to continue killing germs even after washing.    ? ? ?Please do not use if you have an allergy to CHG or antibacterial soaps. If your skin becomes reddened/irritated stop using the CHG.  ?Do not shave (including legs and underarms) for at least 48 hours prior to first CHG shower. It is OK to shave your face. ? ?Please follow these instructions carefully. ?  ? ? Shower the NIGHT BEFORE SURGERY and the MORNING OF SURGERY with CHG Soap.  ? If you chose to wash your hair, wash your hair first as usual with your normal shampoo. After you shampoo, rinse your hair and body thoroughly to remove the shampoo.  Then [employer] and genitals (private parts) with your normal soap and rinse thoroughly to remove soap. ? ?After that Use CHG Soap as you would any other liquid soap. You can apply CHG directly to the skin and wash gently with a scrungie or a clean washcloth.  ? ?Apply the CHG Soap to your body ONLY FROM THE NECK DOWN.  Do not use on open wounds or open sores. Avoid contact with your eyes, ears, mouth and genitals (private parts). Wash Face and genitals (private parts)  with your normal soap.  ? ?Wash thoroughly, paying special attention to the area where your surgery will be performed. ? ?Thoroughly rinse your body with warm water from the neck down. ? ?DO NOT shower/wash with your normal soap after using and rinsing off the CHG Soap. ? ?Pat yourself dry with a CLEAN TOWEL. ? ?Wear CLEAN PAJAMAS to bed the night before surgery ? ?Place CLEAN SHEETS on your bed the night before your surgery ? ?DO NOT SLEEP WITH PETS. ? ? ?Day of Surgery: ?Take a shower with CHG soap. ?Wear Clean/Comfortable clothing the morning of surgery ?Do not apply any deodorants/lotions.   ?Remember to brush your teeth WITH YOUR REGULAR TOOTHPASTE. ? ? ? ?If you received a COVID test during your pre-op visit, it is requested that you wear a mask when out in public, stay away from anyone that may not be feeling well, and notify your surgeon if you develop symptoms. If you have been in contact with  anyone that has tested positive in the last 10 days, please notify your surgeon. ? ?  ?Please read over the following fact sheets that you were given.  ? ?

## 2021-05-18 ENCOUNTER — Encounter (HOSPITAL_COMMUNITY): Payer: Self-pay

## 2021-05-18 ENCOUNTER — Encounter (HOSPITAL_COMMUNITY)
Admission: RE | Admit: 2021-05-18 | Discharge: 2021-05-18 | Disposition: A | Discharge status: Home / Self Care | Payer: No Typology Code available for payment source | Source: Ambulatory Visit | Attending: Orthopedic Surgery | Admitting: Orthopedic Surgery

## 2021-05-18 ENCOUNTER — Other Ambulatory Visit: Payer: Self-pay | Admitting: Hematology & Oncology

## 2021-05-18 ENCOUNTER — Other Ambulatory Visit: Payer: Self-pay

## 2021-05-18 VITALS — BP 147/89 | HR 78 | Temp 98.8°F | Resp 17 | Ht 62.0 in | Wt 170.8 lb

## 2021-05-18 DIAGNOSIS — D6800 Von Willebrand disease, unspecified: Secondary | ICD-10-CM

## 2021-05-18 DIAGNOSIS — Z01818 Encounter for other preprocedural examination: Secondary | ICD-10-CM | POA: Diagnosis present

## 2021-05-18 LAB — URINALYSIS, ROUTINE W REFLEX MICROSCOPIC
Bilirubin Urine: NEGATIVE
Glucose, UA: NEGATIVE mg/dL
Hgb urine dipstick: NEGATIVE
Ketones, ur: NEGATIVE mg/dL
Leukocytes,Ua: NEGATIVE
Nitrite: NEGATIVE
Protein, ur: NEGATIVE mg/dL
Specific Gravity, Urine: 1.004 — ABNORMAL LOW (ref 1.005–1.030)
pH: 7 (ref 5.0–8.0)

## 2021-05-18 LAB — SURGICAL PCR SCREEN
MRSA, PCR: NEGATIVE
Staphylococcus aureus: NEGATIVE

## 2021-05-18 NOTE — Progress Notes (Signed)
PCP - Dr. Legrand Como Hilts ?Cardiologist - Denies ? ?PPM/ICD - Denies ?Device Orders - Denies ?Rep Notified - Denies ? ?Chest x-ray - Not indicated ?EKG - 05/18/21 ?Stress Test - Denies ?ECHO - Denies ?Cardiac Cath - Denies ? ?Sleep Study - Denies ? ?DM - Denies ? ?ERAS Protcol - Yes ?PRE-SURGERY Ensure   ? ?Anesthesia review: Yes current patient of Dr. Marin Olp patient has Von Willebrands disease ? ?Patient denies shortness of breath, fever, cough and chest pain at PAT appointment ? ? ?All instructions explained to the patient, with a verbal understanding of the material. Patient agrees to go over the instructions while at home for a better understanding.  The opportunity to ask questions was provided. ? ? ?

## 2021-05-18 NOTE — Progress Notes (Signed)
Anesthesia Chart Review: ? ?Patient following with hematology for history of von Willebrand's disease.  Based on recent VWD testing, Dr. Marin Olp has recommended DDAVP be given preop.  He has placed an order for this.  I have communicated this with Iran Planas, RN. ? ?Preop labs reviewed, unremarkable. ? ?EKG 05/18/2021: NSR.  Rate 74. ? ? ?Karoline Caldwell, PA-C ?[facility] Short Stay Center/Anesthesiology ?Phone [phone] ?05/18/2021 2:20 PM ? ?

## 2021-05-19 LAB — URINE CULTURE: Culture: NO GROWTH

## 2021-05-21 MED ORDER — TRANEXAMIC ACID 1000 MG/10ML IV SOLN
2000.0000 mg | INTRAVENOUS | Status: DC
  Filled 2021-05-21: qty 20

## 2021-05-22 ENCOUNTER — Inpatient Hospital Stay (HOSPITAL_COMMUNITY)
Admission: RE | Admit: 2021-05-22 | Discharge: 2021-05-25 | DRG: 470 | Disposition: A | Discharge status: Home Health | Payer: No Typology Code available for payment source | Attending: Orthopedic Surgery | Admitting: Orthopedic Surgery

## 2021-05-22 ENCOUNTER — Telehealth: Payer: Self-pay | Admitting: *Deleted

## 2021-05-22 ENCOUNTER — Other Ambulatory Visit: Payer: Self-pay

## 2021-05-22 ENCOUNTER — Encounter (HOSPITAL_COMMUNITY)
Admission: RE | Disposition: A | Discharge status: Home Health | Payer: Self-pay | Source: Home / Self Care | Attending: Orthopedic Surgery

## 2021-05-22 ENCOUNTER — Ambulatory Visit (HOSPITAL_COMMUNITY): Payer: No Typology Code available for payment source | Admitting: Physician Assistant

## 2021-05-22 ENCOUNTER — Ambulatory Visit (HOSPITAL_BASED_OUTPATIENT_CLINIC_OR_DEPARTMENT_OTHER): Payer: No Typology Code available for payment source | Admitting: Anesthesiology

## 2021-05-22 ENCOUNTER — Encounter (HOSPITAL_COMMUNITY): Payer: Self-pay | Admitting: Orthopedic Surgery

## 2021-05-22 DIAGNOSIS — M1711 Unilateral primary osteoarthritis, right knee: Secondary | ICD-10-CM | POA: Diagnosis not present

## 2021-05-22 DIAGNOSIS — F17210 Nicotine dependence, cigarettes, uncomplicated: Secondary | ICD-10-CM | POA: Diagnosis present

## 2021-05-22 DIAGNOSIS — F32A Depression, unspecified: Secondary | ICD-10-CM | POA: Diagnosis present

## 2021-05-22 DIAGNOSIS — D6800 Von Willebrand disease, unspecified: Secondary | ICD-10-CM | POA: Diagnosis present

## 2021-05-22 DIAGNOSIS — Z96651 Presence of right artificial knee joint: Secondary | ICD-10-CM

## 2021-05-22 DIAGNOSIS — Z810 Family history of intellectual disabilities: Secondary | ICD-10-CM

## 2021-05-22 DIAGNOSIS — Z01818 Encounter for other preprocedural examination: Principal | ICD-10-CM

## 2021-05-22 DIAGNOSIS — Z9049 Acquired absence of other specified parts of digestive tract: Secondary | ICD-10-CM

## 2021-05-22 DIAGNOSIS — Z96659 Presence of unspecified artificial knee joint: Secondary | ICD-10-CM

## 2021-05-22 HISTORY — PX: TOTAL KNEE ARTHROPLASTY: SHX125

## 2021-05-22 LAB — TYPE AND SCREEN
ABO/RH(D): O POS
Antibody Screen: NEGATIVE

## 2021-05-22 LAB — POCT PREGNANCY, URINE: Preg Test, Ur: NEGATIVE

## 2021-05-22 SURGERY — ARTHROPLASTY, KNEE, TOTAL
Anesthesia: Spinal | Site: Knee | Laterality: Right

## 2021-05-22 MED ORDER — RIVAROXABAN 10 MG PO TABS: 10.0000 mg | ORAL_TABLET | Freq: Every day | ORAL | Status: DC

## 2021-05-22 MED ORDER — PHENYLEPHRINE HCL-NACL 20-0.9 MG/250ML-% IV SOLN
INTRAVENOUS | Status: DC | PRN
Start: 2021-05-22 — End: 2021-05-22
  Administered 2021-05-22: 50 ug/min via INTRAVENOUS

## 2021-05-22 MED ORDER — IRRISEPT - 450ML BOTTLE WITH 0.05% CHG IN STERILE WATER, USP 99.95% OPTIME
TOPICAL | Status: DC | PRN
  Administered 2021-05-22: 450 mL via TOPICAL

## 2021-05-22 MED ORDER — MORPHINE SULFATE (PF) 4 MG/ML IV SOLN
INTRAVENOUS | Status: DC | PRN
  Administered 2021-05-22 (×2): 4 mg

## 2021-05-22 MED ORDER — BUPIVACAINE IN DEXTROSE 0.75-8.25 % IT SOLN
INTRATHECAL | Status: DC | PRN
  Administered 2021-05-22: 1.6 mL via INTRATHECAL

## 2021-05-22 MED ORDER — SODIUM CHLORIDE 0.9 % IR SOLN
Status: DC | PRN
  Administered 2021-05-22: 3000 mL

## 2021-05-22 MED ORDER — DEXAMETHASONE SODIUM PHOSPHATE 10 MG/ML IJ SOLN
INTRAMUSCULAR | Status: AC
  Filled 2021-05-22: qty 1

## 2021-05-22 MED ORDER — SODIUM CHLORIDE 0.9 % IV SOLN: INTRAVENOUS | Status: DC | PRN

## 2021-05-22 MED ORDER — DEXMEDETOMIDINE (PRECEDEX) IN NS 20 MCG/5ML (4 MCG/ML) IV SYRINGE
PREFILLED_SYRINGE | INTRAVENOUS | Status: AC
  Filled 2021-05-22: qty 5

## 2021-05-22 MED ORDER — SODIUM CHLORIDE 0.9 % IV SOLN
INTRAVENOUS | Status: DC | PRN
  Administered 2021-05-22: 2000 mg via TOPICAL

## 2021-05-22 MED ORDER — DOCUSATE SODIUM 100 MG PO CAPS
100.0000 mg | ORAL_CAPSULE | Freq: Two times a day (BID) | ORAL | Status: DC
  Administered 2021-05-22 – 2021-05-25 (×7): 100 mg via ORAL
  Filled 2021-05-22 (×7): qty 1

## 2021-05-22 MED ORDER — DEXMEDETOMIDINE (PRECEDEX) IN NS 20 MCG/5ML (4 MCG/ML) IV SYRINGE
PREFILLED_SYRINGE | INTRAVENOUS | Status: DC | PRN
  Administered 2021-05-22 (×5): 4 ug via INTRAVENOUS

## 2021-05-22 MED ORDER — PROPOFOL 1000 MG/100ML IV EMUL
INTRAVENOUS | Status: AC
  Filled 2021-05-22: qty 100

## 2021-05-22 MED ORDER — MIDAZOLAM HCL 2 MG/2ML IJ SOLN
INTRAMUSCULAR | Status: DC | PRN
Start: 2021-05-22 — End: 2021-05-22
  Administered 2021-05-22 (×2): 2 mg via INTRAVENOUS

## 2021-05-22 MED ORDER — CEFAZOLIN SODIUM-DEXTROSE 1-4 GM/50ML-% IV SOLN
1.0000 g | Freq: Three times a day (TID) | INTRAVENOUS | Status: AC
  Administered 2021-05-22 (×2): 1 g via INTRAVENOUS
  Filled 2021-05-22 (×2): qty 50

## 2021-05-22 MED ORDER — ACETAMINOPHEN 500 MG PO TABS
1000.0000 mg | ORAL_TABLET | Freq: Four times a day (QID) | ORAL | Status: AC
Start: 2021-05-22 — End: 2021-05-23
  Administered 2021-05-22 – 2021-05-23 (×4): 1000 mg via ORAL
  Filled 2021-05-22 (×4): qty 2

## 2021-05-22 MED ORDER — POVIDONE-IODINE 10 % EX SWAB: 2.0000 "application " | Freq: Once | CUTANEOUS | Status: DC

## 2021-05-22 MED ORDER — POVIDONE-IODINE 10 % EX SWAB
2.0000 "application " | Freq: Once | CUTANEOUS | Status: AC
  Administered 2021-05-22: 2 via TOPICAL

## 2021-05-22 MED ORDER — METOCLOPRAMIDE HCL 5 MG/ML IJ SOLN: 5.0000 mg | Freq: Three times a day (TID) | INTRAMUSCULAR | Status: DC | PRN

## 2021-05-22 MED ORDER — ONDANSETRON HCL 4 MG/2ML IJ SOLN
INTRAMUSCULAR | Status: AC
  Filled 2021-05-22: qty 2

## 2021-05-22 MED ORDER — PROPOFOL 500 MG/50ML IV EMUL
INTRAVENOUS | Status: DC | PRN
  Administered 2021-05-22: 100 ug/kg/min via INTRAVENOUS

## 2021-05-22 MED ORDER — PHENYLEPHRINE 80 MCG/ML (10ML) SYRINGE FOR IV PUSH (FOR BLOOD PRESSURE SUPPORT): PREFILLED_SYRINGE | INTRAVENOUS | Status: DC | PRN

## 2021-05-22 MED ORDER — CLONIDINE HCL (ANALGESIA) 100 MCG/ML EP SOLN
EPIDURAL | Status: AC
  Filled 2021-05-22: qty 10

## 2021-05-22 MED ORDER — METHOCARBAMOL 1000 MG/10ML IJ SOLN
500.0000 mg | Freq: Four times a day (QID) | INTRAVENOUS | Status: DC | PRN
  Filled 2021-05-22: qty 5

## 2021-05-22 MED ORDER — MORPHINE SULFATE (PF) 4 MG/ML IV SOLN
INTRAVENOUS | Status: AC
  Filled 2021-05-22: qty 2

## 2021-05-22 MED ORDER — HYDROMORPHONE HCL 1 MG/ML IJ SOLN
0.2500 mg | INTRAMUSCULAR | Status: DC | PRN
  Administered 2021-05-22 (×2): 0.5 mg via INTRAVENOUS

## 2021-05-22 MED ORDER — ONDANSETRON HCL 4 MG PO TABS
4.0000 mg | ORAL_TABLET | Freq: Four times a day (QID) | ORAL | Status: DC | PRN
  Administered 2021-05-24: 4 mg via ORAL
  Filled 2021-05-22: qty 1

## 2021-05-22 MED ORDER — HYDROMORPHONE HCL 2 MG PO TABS
1.0000 mg | ORAL_TABLET | ORAL | Status: DC | PRN
  Administered 2021-05-22 – 2021-05-23 (×6): 2 mg via ORAL
  Filled 2021-05-22 (×5): qty 1

## 2021-05-22 MED ORDER — FENTANYL CITRATE (PF) 250 MCG/5ML IJ SOLN
INTRAMUSCULAR | Status: DC | PRN
  Administered 2021-05-22 (×2): 50 ug via INTRAVENOUS

## 2021-05-22 MED ORDER — LIDOCAINE 2% (20 MG/ML) 5 ML SYRINGE
INTRAMUSCULAR | Status: AC
  Filled 2021-05-22: qty 5

## 2021-05-22 MED ORDER — SODIUM CHLORIDE 0.9% FLUSH
INTRAVENOUS | Status: DC | PRN
  Administered 2021-05-22 (×2): 10 mL

## 2021-05-22 MED ORDER — PROPOFOL 10 MG/ML IV BOLUS
INTRAVENOUS | Status: AC
  Filled 2021-05-22: qty 20

## 2021-05-22 MED ORDER — TRANEXAMIC ACID-NACL 1000-0.7 MG/100ML-% IV SOLN
1000.0000 mg | INTRAVENOUS | Status: AC
  Administered 2021-05-22: 1000 mg via INTRAVENOUS
  Filled 2021-05-22: qty 100

## 2021-05-22 MED ORDER — FENTANYL CITRATE (PF) 250 MCG/5ML IJ SOLN
INTRAMUSCULAR | Status: AC
  Filled 2021-05-22: qty 5

## 2021-05-22 MED ORDER — MENTHOL 3 MG MT LOZG
1.0000 | LOZENGE | OROMUCOSAL | Status: DC | PRN
  Administered 2021-05-23: 3 mg via ORAL
  Filled 2021-05-22: qty 9

## 2021-05-22 MED ORDER — TRANEXAMIC ACID-NACL 1000-0.7 MG/100ML-% IV SOLN
INTRAVENOUS | Status: AC
  Filled 2021-05-22: qty 100

## 2021-05-22 MED ORDER — CLONIDINE HCL (ANALGESIA) 100 MCG/ML EP SOLN
EPIDURAL | Status: DC | PRN
  Administered 2021-05-22: 100 ug

## 2021-05-22 MED ORDER — ONDANSETRON HCL 4 MG/2ML IJ SOLN
4.0000 mg | Freq: Four times a day (QID) | INTRAMUSCULAR | Status: DC | PRN
  Administered 2021-05-22 – 2021-05-23 (×3): 4 mg via INTRAVENOUS
  Filled 2021-05-22 (×3): qty 2

## 2021-05-22 MED ORDER — CHLORHEXIDINE GLUCONATE 0.12 % MT SOLN
15.0000 mL | Freq: Once | OROMUCOSAL | Status: AC
  Administered 2021-05-22: 15 mL via OROMUCOSAL
  Filled 2021-05-22: qty 15

## 2021-05-22 MED ORDER — PHENOL 1.4 % MT LIQD: 1.0000 | OROMUCOSAL | Status: DC | PRN

## 2021-05-22 MED ORDER — ACETAMINOPHEN 325 MG PO TABS
325.0000 mg | ORAL_TABLET | Freq: Four times a day (QID) | ORAL | Status: DC | PRN
  Administered 2021-05-23 – 2021-05-25 (×5): 650 mg via ORAL
  Filled 2021-05-22 (×5): qty 2

## 2021-05-22 MED ORDER — HYDROMORPHONE HCL 1 MG/ML IJ SOLN
0.5000 mg | INTRAMUSCULAR | Status: DC | PRN
  Administered 2021-05-23 (×3): 0.5 mg via INTRAVENOUS
  Filled 2021-05-22 (×3): qty 0.5

## 2021-05-22 MED ORDER — LIDOCAINE 2% (20 MG/ML) 5 ML SYRINGE
INTRAMUSCULAR | Status: DC | PRN
  Administered 2021-05-22: 25 mg via INTRAVENOUS

## 2021-05-22 MED ORDER — PROPOFOL 10 MG/ML IV BOLUS
INTRAVENOUS | Status: DC | PRN
  Administered 2021-05-22 (×4): 50 mg via INTRAVENOUS

## 2021-05-22 MED ORDER — CEFAZOLIN SODIUM-DEXTROSE 2-4 GM/100ML-% IV SOLN
2.0000 g | INTRAVENOUS | Status: AC
  Administered 2021-05-22: 2 g via INTRAVENOUS
  Filled 2021-05-22: qty 100

## 2021-05-22 MED ORDER — BUPIVACAINE HCL 0.25 % IJ SOLN
INTRAMUSCULAR | Status: DC | PRN
Start: 2021-05-22 — End: 2021-05-22
  Administered 2021-05-22: 30 mL

## 2021-05-22 MED ORDER — SODIUM CHLORIDE 0.9 % IV SOLN
20.0000 ug | INTRAVENOUS | Status: AC
  Administered 2021-05-22: 20 ug via INTRAVENOUS
  Filled 2021-05-22 (×2): qty 5

## 2021-05-22 MED ORDER — POVIDONE-IODINE 7.5 % EX SOLN
Freq: Once | CUTANEOUS | Status: DC
  Filled 2021-05-22: qty 118

## 2021-05-22 MED ORDER — BUPIVACAINE HCL (PF) 0.25 % IJ SOLN
INTRAMUSCULAR | Status: AC
  Filled 2021-05-22: qty 30

## 2021-05-22 MED ORDER — DEXAMETHASONE SODIUM PHOSPHATE 10 MG/ML IJ SOLN
INTRAMUSCULAR | Status: DC | PRN
Start: 2021-05-22 — End: 2021-05-22
  Administered 2021-05-22: 5 mg via INTRAVENOUS

## 2021-05-22 MED ORDER — VANCOMYCIN HCL 1000 MG IV SOLR
INTRAVENOUS | Status: AC
  Filled 2021-05-22: qty 20

## 2021-05-22 MED ORDER — 0.9 % SODIUM CHLORIDE (POUR BTL) OPTIME
TOPICAL | Status: DC | PRN
  Administered 2021-05-22: 5000 mL

## 2021-05-22 MED ORDER — BUPIVACAINE LIPOSOME 1.3 % IJ SUSP
INTRAMUSCULAR | Status: AC
  Filled 2021-05-22: qty 20

## 2021-05-22 MED ORDER — MIDAZOLAM HCL 2 MG/2ML IJ SOLN
INTRAMUSCULAR | Status: AC
  Filled 2021-05-22: qty 2

## 2021-05-22 MED ORDER — LACTATED RINGERS IV SOLN: INTRAVENOUS | Status: DC

## 2021-05-22 MED ORDER — HYDROMORPHONE HCL 1 MG/ML IJ SOLN
INTRAMUSCULAR | Status: AC
  Filled 2021-05-22: qty 1

## 2021-05-22 MED ORDER — VALPROIC ACID 250 MG PO CAPS
250.0000 mg | ORAL_CAPSULE | Freq: Every day | ORAL | Status: DC
Start: 2021-05-22 — End: 2021-05-25
  Administered 2021-05-22 – 2021-05-24 (×3): 250 mg via ORAL
  Filled 2021-05-22 (×4): qty 1

## 2021-05-22 MED ORDER — ONDANSETRON HCL 4 MG/2ML IJ SOLN
INTRAMUSCULAR | Status: DC | PRN
  Administered 2021-05-22: 4 mg via INTRAVENOUS

## 2021-05-22 MED ORDER — VANCOMYCIN HCL 1000 MG IV SOLR
INTRAVENOUS | Status: DC | PRN
  Administered 2021-05-22: 1000 mg via TOPICAL

## 2021-05-22 MED ORDER — ROPIVACAINE HCL 5 MG/ML IJ SOLN
INTRAMUSCULAR | Status: DC | PRN
  Administered 2021-05-22: 30 mL via PERINEURAL

## 2021-05-22 MED ORDER — HYDROMORPHONE HCL 2 MG PO TABS
ORAL_TABLET | ORAL | Status: AC
  Filled 2021-05-22: qty 1

## 2021-05-22 MED ORDER — ORAL CARE MOUTH RINSE: 15.0000 mL | Freq: Once | OROMUCOSAL | Status: AC

## 2021-05-22 MED ORDER — METHOCARBAMOL 500 MG PO TABS
500.0000 mg | ORAL_TABLET | Freq: Four times a day (QID) | ORAL | Status: DC | PRN
  Administered 2021-05-22 – 2021-05-25 (×7): 500 mg via ORAL
  Filled 2021-05-22 (×7): qty 1

## 2021-05-22 MED ORDER — BUPIVACAINE LIPOSOME 1.3 % IJ SUSP
INTRAMUSCULAR | Status: DC | PRN
Start: 2021-05-22 — End: 2021-05-22
  Administered 2021-05-22: 20 mL

## 2021-05-22 MED ORDER — METOCLOPRAMIDE HCL 5 MG PO TABS: 5.0000 mg | ORAL_TABLET | Freq: Three times a day (TID) | ORAL | Status: DC | PRN

## 2021-05-22 SURGICAL SUPPLY — 82 items
BAG COUNTER SPONGE SURGICOUNT (BAG) ×2 IMPLANT
BAG DECANTER FOR FLEXI CONT (MISCELLANEOUS) ×2 IMPLANT
BANDAGE ESMARK 6X9 LF (GAUZE/BANDAGES/DRESSINGS) ×1 IMPLANT
BLADE SAG 18X100X1.27 (BLADE) ×2 IMPLANT
BLADE SAGITTAL (BLADE) ×1
BLADE SAW THK.89X75X18XSGTL (BLADE) ×1 IMPLANT
BNDG COHESIVE 6X5 TAN STRL LF (GAUZE/BANDAGES/DRESSINGS) ×2 IMPLANT
BNDG ELASTIC 6X10 VLCR STRL LF (GAUZE/BANDAGES/DRESSINGS) ×1 IMPLANT
BNDG ELASTIC 6X15 VLCR STRL LF (GAUZE/BANDAGES/DRESSINGS) ×2 IMPLANT
BNDG ESMARK 6X9 LF (GAUZE/BANDAGES/DRESSINGS) ×2
BOWL SMART MIX CTS (DISPOSABLE) IMPLANT
CNTNR URN SCR LID CUP LEK RST (MISCELLANEOUS) ×1 IMPLANT
COMP FEMORAL CMTL TRIATH SZ2 (Joint) ×2 IMPLANT
COMPONENT FEMRL CMTL TRITH SZ2 (Joint) IMPLANT
CONT SPEC 4OZ STRL OR WHT (MISCELLANEOUS) ×1
COVER SURGICAL LIGHT HANDLE (MISCELLANEOUS) ×2 IMPLANT
CUFF TOURN SGL QUICK 34 (TOURNIQUET CUFF) ×1
CUFF TOURN SGL QUICK 42 (TOURNIQUET CUFF) IMPLANT
CUFF TRNQT CYL 34X4.125X (TOURNIQUET CUFF) ×1 IMPLANT
DECANTER SPIKE VIAL GLASS SM (MISCELLANEOUS) ×2 IMPLANT
DRAPE INCISE IOBAN 66X45 STRL (DRAPES) IMPLANT
DRAPE ORTHO SPLIT 77X108 STRL (DRAPES) ×3
DRAPE SURG ORHT 6 SPLT 77X108 (DRAPES) ×3 IMPLANT
DRAPE U-SHAPE 47X51 STRL (DRAPES) ×2 IMPLANT
DRSG AQUACEL AG ADV 3.5X10 (GAUZE/BANDAGES/DRESSINGS) ×1 IMPLANT
DRSG AQUACEL AG ADV 3.5X14 (GAUZE/BANDAGES/DRESSINGS) IMPLANT
DURAPREP 26ML APPLICATOR (WOUND CARE) ×4 IMPLANT
ELECT CAUTERY BLADE 6.4 (BLADE) ×2 IMPLANT
ELECT REM PT RETURN 9FT ADLT (ELECTROSURGICAL) ×2
ELECTRODE REM PT RTRN 9FT ADLT (ELECTROSURGICAL) ×1 IMPLANT
GAUZE SPONGE 4X4 12PLY STRL (GAUZE/BANDAGES/DRESSINGS) ×2 IMPLANT
GLOVE BIOGEL PI IND STRL 7.0 (GLOVE) ×1 IMPLANT
GLOVE BIOGEL PI IND STRL 8 (GLOVE) ×1 IMPLANT
GLOVE BIOGEL PI INDICATOR 7.0 (GLOVE) ×1
GLOVE BIOGEL PI INDICATOR 8 (GLOVE) ×1
GLOVE ECLIPSE 7.0 STRL STRAW (GLOVE) ×2 IMPLANT
GLOVE ECLIPSE 8.0 STRL XLNG CF (GLOVE) ×2 IMPLANT
GLOVE SURG ENC MOIS LTX SZ6.5 (GLOVE) ×6 IMPLANT
GOWN STRL REUS W/ TWL LRG LVL3 (GOWN DISPOSABLE) ×3 IMPLANT
GOWN STRL REUS W/TWL LRG LVL3 (GOWN DISPOSABLE) ×3
HANDPIECE INTERPULSE COAX TIP (DISPOSABLE) ×1
HOOD PEEL AWAY FLYTE STAYCOOL (MISCELLANEOUS) ×6 IMPLANT
IMMOBILIZER KNEE 20 (SOFTGOODS) ×2
IMMOBILIZER KNEE 20 THIGH 36 (SOFTGOODS) IMPLANT
IMMOBILIZER KNEE 22 UNIV (SOFTGOODS) IMPLANT
IMMOBILIZER KNEE 24 THIGH 36 (MISCELLANEOUS) IMPLANT
IMMOBILIZER KNEE 24 UNIV (MISCELLANEOUS)
INSERT TIB BEAR TRIATH 3 12 (Insert) ×1 IMPLANT
INSERT TIB BEARING SZ3 11 (Miscellaneous) ×1 IMPLANT
KIT BASIN OR (CUSTOM PROCEDURE TRAY) ×2 IMPLANT
KIT TURNOVER KIT B (KITS) ×2 IMPLANT
KNEE PATELLA ASYMMETRIC 10X32 (Knees) ×1 IMPLANT
KNEE TIBIAL COMPONENT SZ3 (Knees) ×1 IMPLANT
MANIFOLD NEPTUNE II (INSTRUMENTS) ×2 IMPLANT
NDL SPNL 18GX3.5 QUINCKE PK (NEEDLE) ×1 IMPLANT
NEEDLE 22X1 1/2 (OR ONLY) (NEEDLE) ×4 IMPLANT
NEEDLE SPNL 18GX3.5 QUINCKE PK (NEEDLE) ×2 IMPLANT
NS IRRIG 1000ML POUR BTL (IV SOLUTION) ×4 IMPLANT
PACK TOTAL JOINT (CUSTOM PROCEDURE TRAY) ×2 IMPLANT
PAD ARMBOARD 7.5X6 YLW CONV (MISCELLANEOUS) ×4 IMPLANT
PAD CAST 4YDX4 CTTN HI CHSV (CAST SUPPLIES) ×1 IMPLANT
PADDING CAST COTTON 4X4 STRL (CAST SUPPLIES) ×1
PADDING CAST COTTON 6X4 STRL (CAST SUPPLIES) ×2 IMPLANT
PIN FLUTED HEDLESS FIX 3.5X1/8 (PIN) ×1 IMPLANT
SET HNDPC FAN SPRY TIP SCT (DISPOSABLE) ×1 IMPLANT
STRIP CLOSURE SKIN 1/2X4 (GAUZE/BANDAGES/DRESSINGS) ×4 IMPLANT
SUCTION FRAZIER HANDLE 10FR (MISCELLANEOUS) ×1
SUCTION TUBE FRAZIER 10FR DISP (MISCELLANEOUS) ×1 IMPLANT
SUT MNCRL AB 3-0 PS2 18 (SUTURE) ×2 IMPLANT
SUT VIC AB 0 CT1 27 (SUTURE) ×3
SUT VIC AB 0 CT1 27XBRD ANBCTR (SUTURE) ×3 IMPLANT
SUT VIC AB 1 CT1 36 (SUTURE) ×10 IMPLANT
SUT VIC AB 2-0 CT1 27 (SUTURE) ×4
SUT VIC AB 2-0 CT1 TAPERPNT 27 (SUTURE) ×4 IMPLANT
SYR 30ML LL (SYRINGE) ×6 IMPLANT
SYR TB 1ML LUER SLIP (SYRINGE) ×2 IMPLANT
TOWEL GREEN STERILE (TOWEL DISPOSABLE) ×4 IMPLANT
TOWEL GREEN STERILE FF (TOWEL DISPOSABLE) ×4 IMPLANT
TRAY CATH 16FR W/PLASTIC CATH (SET/KITS/TRAYS/PACK) IMPLANT
WATER STERILE IRR 1000ML POUR (IV SOLUTION) IMPLANT
WRAP KNEE MAXI GEL POST OP (GAUZE/BANDAGES/DRESSINGS) ×1 IMPLANT
YANKAUER SUCT BULB TIP NO VENT (SUCTIONS) ×2 IMPLANT

## 2021-05-22 NOTE — Anesthesia Postprocedure Evaluation (Signed)
Anesthesia Post Note ? ?Patient: Cheryl Tate ? ?Procedure(s) Performed: RIGHT TOTAL KNEE ARTHROPLASTY (Right: Knee) ? ?  ? ?Patient location during evaluation: PACU ?Anesthesia Type: Spinal ?Level of consciousness: oriented and awake and alert ?Pain management: pain level controlled ?Vital Signs Assessment: post-procedure vital signs reviewed and stable ?Respiratory status: spontaneous breathing, respiratory function stable and patient connected to nasal cannula oxygen ?Cardiovascular status: blood pressure returned to baseline and stable ?Postop Assessment: no headache, no backache, no apparent nausea or vomiting and spinal receding ?Anesthetic complications: no ? ? ?No notable events documented. ? ?Last Vitals:  ?Vitals:  ? 05/22/21 1222 05/22/21 1259  ?BP: 121/74 (!) 125/92  ?Pulse: 67 65  ?Resp: 15 18  ?Temp:  36.5 ?C  ?SpO2: 99% 100%  ?  ?Last Pain:  ?Vitals:  ? 05/22/21 1259  ?TempSrc: Axillary  ?PainSc:   ? ? ?  ?  ?  ?  ?  ?  ? ?Effie Berkshire ? ? ? ? ?

## 2021-05-22 NOTE — Brief Op Note (Signed)
? ?  05/22/2021 ? ?11:19 AM ? ?PATIENT:  Cheryl Tate  51 y.o. female ? ?PRE-OPERATIVE DIAGNOSIS:  right knee osteoarthritis ? ?POST-OPERATIVE DIAGNOSIS:  right knee osteoarthritis ? ?PROCEDURE:  Procedure(s): ?RIGHT TOTAL KNEE ARTHROPLASTY ? ?SURGEON:  Surgeon(s): ?Meredith Pel, MD ? ?ASSISTANT: magnant pa ? ?ANESTHESIA:   spinal ? ?EBL: 150 ml   ? ?Total I/O ?In: 1400 [I.V.:1200; IV Piggyback:200] ?Out: 150 [Blood:150] ? ?BLOOD ADMINISTERED: none ? ?DRAINS: none  ? ?LOCAL MEDICATIONS USED:  marcaine mso4 clonidine exparel vanco ? ?SPECIMEN:  No Specimen ? ?COUNTS:  YES ? ?TOURNIQUET:   ?Total Tourniquet Time Documented: ?Thigh (Right) - 60 minutes ?Thigh (Right) - 26 minutes ?Total: Thigh (Right) - 86 minutes ? ? ?DICTATION: .Other Dictation: Dictation Number [id number] ? ?PLAN OF CARE: Admit for overnight observation ? ?PATIENT DISPOSITION:  PACU - hemodynamically stable ? ? ? ? ? ? ? ? ? ? ? ? ?  ?

## 2021-05-22 NOTE — Telephone Encounter (Signed)
Per 05/14/21 los - Follow-up as needed.  ?

## 2021-05-22 NOTE — Transfer of Care (Signed)
Immediate Anesthesia Transfer of Care Note ? ?Patient: Cheryl Tate ? ?Procedure(s) Performed: RIGHT TOTAL KNEE ARTHROPLASTY (Right: Knee) ? ?Patient Location: PACU ? ?Anesthesia Type:MAC combined with regional for post-op pain ? ?Level of Consciousness: awake, patient cooperative and responds to stimulation ? ?Airway & Oxygen Therapy: Patient Spontanous Breathing and Patient connected to nasal cannula oxygen ? ?Post-op Assessment: Report given to RN and Post -op Vital signs reviewed and stable ? ?Post vital signs: Reviewed and stable ? ?Last Vitals:  ?Vitals Value Taken Time  ?BP 105/64 05/22/21 1052  ?Temp    ?Pulse 68 05/22/21 1054  ?Resp 11 05/22/21 1054  ?SpO2 100 % 05/22/21 1054  ?Vitals shown include unvalidated device data. ? ?Last Pain:  ?Vitals:  ? 05/22/21 0624  ?TempSrc:   ?PainSc: 0-No pain  ?   ? ?  ? ?Complications: No notable events documented. ?

## 2021-05-22 NOTE — H&P (Signed)
TOTAL KNEE ADMISSION H&P ? ?Patient is being admitted for right total knee arthroplasty. ? ?Subjective: ? ?Chief Complaint:right knee pain. ? ?HPI: Cheryl Tate, 51 y.o. female, has a history of pain and functional disability in the right knee due to arthritis and has failed non-surgical conservative treatments for greater than 12 weeks to includeNSAID's and/or analgesics, corticosteriod injections, flexibility and strengthening excercises, and activity modification.  Onset of symptoms was abrupt, starting 5 years ago with gradually worsening course since that time. The patient noted no past surgery on the right knee(s).  Patient currently rates pain in the right knee(s) at 8 out of 10 with activity. Patient has night pain, worsening of pain with activity and weight bearing, pain that interferes with activities of daily living, pain with passive range of motion, crepitus, and joint swelling.  Patient has evidence of joint space narrowing and sclerosis by imaging studies. This patient has had  a history of von Willebrand's disease.  Preoperatively the patient will be given DDAVP.  We will also use TXA. Marland Kitchen There is no active infection. ? ?Patient Active Problem List  ? Diagnosis Date Noted  ? BMI 33.0-33.9,adult 06/16/2015  ? Von Willebrand disease ([place]) 06/13/2015  ? Depression 12/29/2014  ? Rhinitis, allergic 12/28/2014  ? ?Past Medical History:  ?Diagnosis Date  ? Anemia   ? Depression   ? Menorrhagia   ? Mood disorder ([place])   ? Von Willebrand's disease [facility])   ? followed by hemotologist Dr. Marin Olp  ?  ?Past Surgical History:  ?Procedure Laterality Date  ? APPENDECTOMY  01/22/2004  ? DILATION AND CURETTAGE OF UTERUS  2002  ? x 2 in 2002  ? DILITATION & CURRETTAGE/HYSTROSCOPY WITH HYDROTHERMAL ABLATION N/A 11/22/2014  ? Procedure: HYSTEROSCOPY WITH HYDROTHERMAL ABLATION;  Surgeon: Dian Queen, MD;  Location: [facility];  Service: Gynecology;  Laterality: N/A;  ? HYSTEROSCOPY WITH D & C   08-08-2014  in [place]  ? MULTIPLE TOOTH EXTRACTIONS    ?  ?Current Facility-Administered Medications  ?Medication Dose Route Frequency Provider Last Rate Last Admin  ? ceFAZolin (ANCEF) IVPB 2g/100 mL premix  2 g Intravenous On Call to OR Magnant, Charles L, PA-C      ? desmopressin (DDAVP) 20 mcg in sodium chloride 0.9 % 50 mL IVPB  20 mcg Intravenous On Call to OR Volanda Napoleon, MD 100 mL/hr at 05/22/21 0633 20 mcg at 05/22/21 6283  ? lactated ringers infusion   Intravenous Continuous Stoltzfus, Kaleisha Bhargava P, DO      ? povidone-iodine (BETADINE) 7.5 % scrub   Topical Once Magnant, Gerrianne Scale, PA-C      ? povidone-iodine 10 % swab 2 application.  2 application. Topical Once Magnant, Charles L, PA-C      ? tranexamic acid (CYKLOKAPRON) 2,000 mg in sodium chloride 0.9 % 50 mL Topical Application  1,517 mg Topical To OR Meredith Pel, MD      ? tranexamic acid (CYKLOKAPRON) IVPB 1,000 mg  1,000 mg Intravenous To OR Magnant, Gerrianne Scale, PA-C      ? ?Allergies  ?Allergen Reactions  ? Bee Venom Swelling  ? Nsaids   ?  Avoid due to von willebrand disease   ?  ?Social History  ? ?Tobacco Use  ? Smoking status: Every Day  ?  Packs/day: 0.20  ?  Years: 25.00  ?  Pack years: 5.00  ?  Types: Cigarettes  ? Smokeless tobacco: Never  ? Tobacco comments:  ?  2 cig  daily  ?Substance Use Topics  ? Alcohol use: Yes  ?  Alcohol/week: 7.0 standard drinks  ?  Types: 7 Glasses of wine per week  ?  Comment: one wine daily  ?  ?Family History  ?Problem Relation Age of Onset  ? Mental retardation Mother   ? Mental retardation Brother   ? Mental retardation Maternal Grandmother   ?  ? ?Review of Systems  ?Musculoskeletal:  Positive for arthralgias.  ?All other systems reviewed and are negative. ? ?Objective: ? ?Physical Exam ?Vitals reviewed.  ?HENT:  ?   Head: Normocephalic.  ?   Nose: Nose normal.  ?   Mouth/Throat:  ?   Mouth: Mucous membranes are moist.  ?Eyes:  ?   Pupils: Pupils are equal, round, and reactive to light.   ?Cardiovascular:  ?   Rate and Rhythm: Normal rate.  ?   Pulses: Normal pulses.  ?Pulmonary:  ?   Effort: Pulmonary effort is normal.  ?Abdominal:  ?   General: Abdomen is flat.  ?Musculoskeletal:  ?   Cervical back: Normal range of motion.  ?Skin: ?   General: Skin is warm.  ?   Capillary Refill: Capillary refill takes less than 2 seconds.  ?Neurological:  ?   General: No focal deficit present.  ?   Mental Status: She is alert.  ?Psychiatric:     ?   Mood and Affect: Mood normal.  ? ? Ortho exam demonstrates normal gait and alignment slightly antalgic to the right.  Pedal pulses palpable.  She has about a 3 to 5 degree flexion contracture in the right knee compared to the left.  Has medial greater than lateral joint line tenderness with mild patellofemoral crepitus.  No groin pain with internal/external rotation of the leg.  No other masses lymphadenopathy or skin changes noted in that knee region.  Flexion is to 120. ?Vital signs in last 24 hours: ?Temp:  [98.7 ?F (37.1 ?C)] 98.7 ?F (37.1 ?C) (05/02 8088) ?Pulse Rate:  [70] 70 (05/02 0610) ?Resp:  [17] 17 (05/02 0610) ?BP: (141)/(90) 141/90 (05/02 0610) ?SpO2:  [99 %] 99 % (05/02 0610) ?Weight:  [76.2 kg] 76.2 kg (05/02 0610) ? ?Labs: ? ? ?Estimated body mass index is 30.73 kg/m? as calculated from the following: ?  Height as of this encounter: 5' 2"  (1.575 m). ?  Weight as of this encounter: 76.2 kg. ? ? ?Imaging Review ?Plain radiographs demonstrate moderate degenerative joint disease of the right knee(s). The overall alignment ismild varus. The bone quality appears to be good for age and reported activity level. ? ? ? ? ? ?Assessment/Plan: ? ?End stage arthritis, right knee  ? ?The patient history, physical examination, clinical judgment of the provider and imaging studies are consistent with end stage degenerative joint disease of the right knee(s) and total knee arthroplasty is deemed medically necessary. The treatment options including medical management,  injection therapy arthroscopy and arthroplasty were discussed at length. The risks and benefits of total knee arthroplasty were presented and reviewed. The risks due to aseptic loosening, infection, stiffness, patella tracking problems, thromboembolic complications and other imponderables were discussed. The patient acknowledged the explanation, agreed to proceed with the plan and consent was signed. Patient is being admitted for inpatient treatment for surgery, pain control, PT, OT, prophylactic antibiotics, VTE prophylaxis, progressive ambulation and ADL's and discharge planning. The patient is planning to be discharged home with home health services.  She is at slightly higher risk for postoperative complications due to  her von Willebrand's disease.  We have taken measures to minimize those complications.  All questions answered. ? ? ? ? ?Patient's anticipated LOS is less than 2 midnights, meeting these requirements: ?- Younger than 41 ?- Lives within 1 hour of care ?- Has a competent adult at home to recover with post-op recover ?- NO history of ? - Chronic pain requiring opiods ? - Diabetes ? - Coronary Artery Disease ? - Heart failure ? - Heart attack ? - Stroke ? - DVT/VTE ? - Cardiac arrhythmia ? - Respiratory Failure/COPD ? - Renal failure ? - Anemia ? - Advanced Liver disease ? ? ?

## 2021-05-22 NOTE — Progress Notes (Signed)
Orthopedic Tech Progress Note ?Patient Details:  ?Cheryl Tate ?Apr 12, 1970 ?[id number] ? ?PACU RN called requesting a CPM and BONE FOAM  ? ?Ortho Devices ?Type of Ortho Device: Bone foam zero knee ?Ortho Device/Splint Interventions: Ordered ?  ?Post Interventions ?Patient Tolerated: Well ?Instructions Provided: Care of device ? ?Janit Pagan ?05/22/2021, 11:50 AM ? ?

## 2021-05-22 NOTE — Progress Notes (Signed)
ANTICOAGULATION CONSULT NOTE - Initial Consult ? ?Pharmacy Consult for Xarelto ?Indication: VTE prophylaxis ? ?Allergies  ?Allergen Reactions  ? Bee Venom Swelling  ? Nsaids   ?  Avoid due to von willebrand disease   ? ? ?Patient Measurements: ?Height: 5' 2"  (157.5 cm) ?Weight: 76.2 kg (168 lb) ?IBW/kg (Calculated) : 50.1 ?Heparin Dosing Weight:  ? ?Vital Signs: ?Temp: 98.2 ?F (36.8 ?C) (05/02 1913) ?Temp Source: Oral (05/02 1913) ?BP: 121/79 (05/02 1913) ?Pulse Rate: 80 (05/02 1913) ? ?Labs: ?No results for input(s): HGB, HCT, PLT, APTT, LABPROT, INR, HEPARINUNFRC, HEPRLOWMOCWT, CREATININE, CKTOTAL, CKMB, TROPONINIHS in the last 72 hours. ? ?Estimated Creatinine Clearance: 80.4 mL/min (by C-G formula based on SCr of 0.65 mg/dL). ? ? ?Medical History: ?Past Medical History:  ?Diagnosis Date  ? Anemia   ? Depression   ? Menorrhagia   ? Mood disorder ([place])   ? Von Willebrand's disease [facility])   ? followed by hemotologist Dr. Marin Olp  ? ? ?Medications:  ?Medications Prior to Admission  ?Medication Sig Dispense Refill Last Dose  ? acetaminophen (TYLENOL) 500 MG tablet Take 1,000 mg by mouth every 6 (six) hours as needed for moderate pain.   05/22/2021 at 0330  ? Ascorbic Acid (VITAMIN C) 1000 MG tablet Take 1,000 mg by mouth daily.   05/21/2021  ? CALCIUM PO Take 1 tablet by mouth daily at 6 (six) AM. Raw calcium with other vitamins   05/21/2021  ? Cholecalciferol (VITAMIN D3) 125 MCG (5000 UT) CAPS TAKE ONE CAPSULE BY MOUTH DAILY 100 capsule PRN 05/21/2021  ? Cobalamin Combinations (B12 FOLATE PO) Take 1 tablet by mouth daily.   05/21/2021  ? [employer] TRIPLE MAGNESIUM COMPLEX PO Take 1 tablet by mouth at bedtime.   05/21/2021  ? EPINEPHrine 0.3 mg/0.3 mL IJ SOAJ injection Inject 0.3 mg into the muscle as needed for anaphylaxis.     ? estradiol (ESTRACE) 0.5 MG tablet Take 0.5 mg by mouth daily.   05/22/2021 at 0400  ? Ferrous Sulfate (IRON PO) Take 1 tablet by mouth daily at 6 (six) AM. Hemangenics Iron   05/21/2021  ? fluticasone (FLONASE)  50 MCG/ACT nasal spray Place 1 spray into both nostrils daily as needed for allergies or rhinitis.   05/22/2021 at 0200  ? FOLIC ACID PO Take 1 capsule by mouth daily.   05/21/2021  ? Glucosamine-Chondroit-Vit C-Mn (GLUCOSAMINE 1500 COMPLEX PO) Take 1,500 mg by mouth daily.   05/21/2021  ? levocetirizine (XYZAL) 5 MG tablet Take 5 mg by mouth daily as needed for allergies.   Past Week  ? Naphazoline-Pheniramine (VISINE-A OP) Place 1 drop into both eyes daily as needed (allergies).   05/22/2021 at 0500  ? progesterone (PROMETRIUM) 200 MG capsule Take 200 mg by mouth at bedtime.   Past Week  ? TURMERIC PO Take 1,000 mg by mouth daily.   05/21/2021  ? valproic acid (DEPAKENE) 250 MG capsule Take 250 mg by mouth at bedtime.   Past Week  ? ?Scheduled:  ? acetaminophen  1,000 mg Oral Q6H  ? docusate sodium  100 mg Oral BID  ? HYDROmorphone      ? [START ON 05/23/2021] rivaroxaban  10 mg Oral Daily  ? valproic acid  250 mg Oral QHS  ? ?Infusions:  ?  ceFAZolin (ANCEF) IV 1 g (05/22/21 1334)  ? lactated ringers Stopped (05/22/21 1645)  ? methocarbamol (ROBAXIN) IV    ? ? ?Assessment: ?Pt was adm knee surgery. She is s/p R total knee arthroplasty.  She has a hx of von Willebrand's disease. Plan for Xarelto for DVT prophylax on POD1 ? ?Hgb 12, plt wnl ? ?Goal of Therapy:  ? ?Monitor platelets by anticoagulation protocol: Yes ?  ?Plan:  ? ?Xarelto 45m PO qday starting 5/3 @1300  ?Rx will follow peripherally ? ?MOnnie Boer PharmD, BCIDP, AAHIVP, CPP ?Infectious Disease Pharmacist ?05/22/2021 8:24 PM ? ? ? ? ?

## 2021-05-22 NOTE — Anesthesia Preprocedure Evaluation (Addendum)
Anesthesia Evaluation  ?Patient identified by MRN, date of birth, ID band ?Patient awake ? ? ? ?Reviewed: ?Allergy & Precautions, NPO status , Patient's Chart, lab work & pertinent test results ? ?Airway ?Mallampati: I ? ?TM Distance: >3 FB ?Neck ROM: Full ? ? ? Dental ? ?(+) Teeth Intact, Dental Advisory Given ?  ?Pulmonary ?Current Smoker,  ?  ?breath sounds clear to auscultation ? ? ? ? ? ? Cardiovascular ?negative cardio ROS ? ? ?Rhythm:Regular Rate:Normal ? ? ?  ?Neuro/Psych ?  ? GI/Hepatic ?negative GI ROS, Neg liver ROS,   ?Endo/Other  ?negative endocrine ROS ? Renal/GU ?negative Renal ROS  ? ?  ?Musculoskeletal ?negative musculoskeletal ROS ?(+)  ? Abdominal ?Normal abdominal exam  (+)   ?Peds ? Hematology ?negative hematology ROS ?(+)   ?Anesthesia Other Findings ? ? Reproductive/Obstetrics ? ?  ? ? ? ? ? ? ? ? ? ? ? ? ? ?  ?  ? ? ? ? ? ? ? ?Anesthesia Physical ?Anesthesia Plan ? ?ASA: 3 ? ?Anesthesia Plan: Spinal  ? ?Post-op Pain Management: Regional block*  ? ?Induction: Intravenous ? ?PONV Risk Score and Plan: 2 and Ondansetron, Propofol infusion and Midazolam ? ?Airway Management Planned: Natural Airway and Simple Face Mask ? ?Additional Equipment: None ? ?Intra-op Plan:  ? ?Post-operative Plan:  ? ?Informed Consent: I have reviewed the patients History and Physical, chart, labs and discussed the procedure including the risks, benefits and alternatives for the proposed anesthesia with the patient or authorized representative who has indicated his/her understanding and acceptance.  ? ? ? ? ? ?Plan Discussed with: CRNA ? ?Anesthesia Plan Comments: (Mild Type 1 vWD - no major spontaneous bleeding events, no issues with neuraxial in the past. DDAVP administered preop. Type & screen performed. Will proceed with spinal anesthesia. )  ? ? ? ? ? ?Anesthesia Quick Evaluation ? ?

## 2021-05-22 NOTE — Evaluation (Signed)
Physical Therapy Evaluation ?Patient Details ?Name: Cheryl Tate [facility] ?MRN: [id number] ?DOB: [birth year] ?Today's Date: 05/22/2021 ? ?History of Present Illness ? Pt is a 51 y.o. female s/p elective R TKA on 05/22/21. PMH includes depression.  ?Clinical Impression ? Pt presents with an overall decrease in functional mobility secondary to above. PTA, pt independent, active with yoga, lives at home with supportive husband and child. Initiated educ re: precautions, positioning, therex, and importance of mobility. Today, pt able to initiate standing mobility with RW and min guard; activity limited by nausea. Pt demonstrates great RLE muscle activation and knee AROM. Pt would benefit from continued acute PT services to maximize functional mobility and independence prior to d/c home.     ? ?Recommendations for follow up therapy are one component of a multi-disciplinary discharge planning process, led by the attending physician.  Recommendations may be updated based on patient status, additional functional criteria and insurance authorization. ? ?Follow Up Recommendations Follow physician's recommendations for discharge plan and follow up therapies ? ?  ?Assistance Recommended at Discharge Intermittent Supervision/Assistance  ?Patient can return home with the following ? A little help with bathing/dressing/bathroom;Assistance with cooking/housework;Assist for transportation;Help with stairs or ramp for entrance ? ?  ?Equipment Recommendations None recommended by PT  ?Recommendations for Other Services ?    ?  ?Functional Status Assessment Patient has had a recent decline in their functional status and demonstrates the ability to make significant improvements in function in a reasonable and predictable amount of time.  ? ?  ?Precautions / Restrictions Precautions ?Precautions: Knee;Fall ?Restrictions ?Weight Bearing Restrictions: Yes ?RLE Weight Bearing: Weight bearing as tolerated  ? ?  ? ?Mobility ? Bed Mobility ?Overal bed  mobility: Modified Independent ?  ?  ?  ?  ?  ?  ?General bed mobility comments: HOB slightly elevated, pt actively moving RLE to EOB without assist ?  ? ?Transfers ?Overall transfer level: Needs assistance ?Equipment used: Standard walker ?Transfers: Sit to/from Stand, Bed to chair/wheelchair/BSC ?Sit to Stand: Min guard ?  ?Step pivot transfers: Min guard ?  ?  ?  ?General transfer comment: cues for hand placement, min guard for balance; pivotal steps from bed to recliner without assist, further mobility limited by nausea ?  ? ?Ambulation/Gait ?  ?  ?  ?  ?  ?  ?  ?  ? ?Stairs ?  ?  ?  ?  ?  ? ?Wheelchair Mobility ?  ? ?Modified Rankin (Stroke Patients Only) ?  ? ?  ? ?Balance Overall balance assessment: Needs assistance ?Sitting-balance support: No upper extremity supported, Feet supported ?Sitting balance-Leahy Scale: Good ?  ?  ?Standing balance support: Reliant on assistive device for balance ?Standing balance-Leahy Scale: Poor ?  ?  ?  ?  ?  ?  ?  ?  ?  ?  ?  ?  ?   ? ? ? ?Pertinent Vitals/Pain Pain Assessment ?Pain Assessment: Faces ?Faces Pain Scale: Hurts even more ?Pain Location: R knee ?Pain Descriptors / Indicators: Discomfort, Grimacing, Guarding ?Pain Intervention(s): Monitored during session, Limited activity within patient's tolerance, Ice applied, Patient requesting pain meds-RN notified  ? ? ?Home Living Family/patient expects to be discharged to:: Private residence ?Living Arrangements: Spouse/significant other;Children ?Available Help at Discharge: Family;Available 24 hours/day ?Type of Home: House ?Home Access: Stairs to enter ?  ?  ?Alternate Level Stairs-Number of Steps: 3-4 up, 3-4 down ?Home Layout: Two level (split level) ?Home Equipment: Standard Walker;Toilet riser ?Additional Comments: Husband works from  home, able to assist as needed  ?  ?Prior Function Prior Level of Function : Independent/Modified Independent;Driving ?  ?  ?  ?  ?  ?  ?Mobility Comments: Independent without DME; does  yoga daily; has one kid left at home, drives them to school ?  ?  ? ? ?Hand Dominance  ?   ? ?  ?Extremity/Trunk Assessment  ? Upper Extremity Assessment ?Upper Extremity Assessment: Overall WFL for tasks assessed ?  ? ?Lower Extremity Assessment ?Lower Extremity Assessment: RLE deficits/detail ?RLE Deficits / Details: s/p R TKA, able to perform partial SLR, near full LAQ (suspect limited by ace wrap), ~90' active knee flexion ?RLE: Unable to fully assess due to pain;Unable to fully assess due to immobilization ?RLE Coordination: decreased gross motor ?  ? ?Cervical / Trunk Assessment ?Cervical / Trunk Assessment: Normal  ?Communication  ? Communication: No difficulties  ?Cognition Arousal/Alertness: Awake/alert ?Behavior During Therapy: Flat affect ?Overall Cognitive Status: Within Functional Limits for tasks assessed ?  ?  ?  ?  ?  ?  ?  ?  ?  ?  ?  ?  ?  ?  ?  ?  ?General Comments: WFL for simple tasks, requires some redirection ?  ?  ? ?  ?General Comments General comments (skin integrity, edema, etc.): pt's husband present and supportive. initiated post-op TKA educ re: precautions, positioning, bone foam use, activity recommendations, importance of R knee AROM, DME needs ? ?  ?Exercises Total Joint Exercises ?Ankle Circles/Pumps: AROM, Right, Seated ?Long Arc Quad: AROM, Right, Seated ?Knee Flexion: AROM, Right, Seated  ? ?Assessment/Plan  ?  ?PT Assessment Patient needs continued PT services  ?PT Problem List Decreased strength;Decreased range of motion;Decreased activity tolerance;Decreased balance;Decreased mobility;Decreased knowledge of use of DME;Decreased knowledge of precautions;Pain ? ?   ?  ?PT Treatment Interventions DME instruction;Gait training;Stair training;Functional mobility training;Therapeutic activities;Therapeutic exercise;Balance training;Patient/family education   ? ?PT Goals (Current goals can be found in the Care Plan section)  ?Acute Rehab PT Goals ?Patient Stated Goal: "When can I get  back to yoga?" ?PT Goal Formulation: With patient ?Time For Goal Achievement: 06/05/21 ?Potential to Achieve Goals: Good ? ?  ?Frequency 7X/week ?  ? ? ?Co-evaluation   ?  ?  ?  ?  ? ? ?  ?AM-PAC PT "6 Clicks" Mobility  ?Outcome Measure Help needed turning from your back to your side while in a flat bed without using bedrails?: None ?Help needed moving from lying on your back to sitting on the side of a flat bed without using bedrails?: None ?Help needed moving to and from a bed to a chair (including a wheelchair)?: A Little ?Help needed standing up from a chair using your arms (e.g., wheelchair or bedside chair)?: A Little ?Help needed to walk in hospital room?: A Little ?Help needed climbing 3-5 steps with a railing? : A Little ?6 Click Score: 20 ? ?  ?End of Session Equipment Utilized During Treatment: Gait belt ?Activity Tolerance: Patient tolerated treatment well;Other (comment) (limited by nausea) ?Patient left: in chair;with call bell/phone within reach;with chair alarm set;with family/visitor present;with nursing/sitter in room ?Nurse Communication: Mobility status;Patient requests pain meds ?PT Visit Diagnosis: Other abnormalities of gait and mobility (R26.89);Pain ?Pain - Right/Left: Right ?Pain - part of body: Knee ?  ? ?Time: 4401-0272 ?PT Time Calculation (min) (ACUTE ONLY): 28 min ? ? ?Charges:   PT Evaluation ?$PT Eval Low Complexity: 1 Low ?PT Treatments ?$Self Care/Home Management: 8-22 ?  ?   ? ?  Mabeline Caras, PT, DPT ?Acute Rehabilitation Services  ?Pager [phone] ?Office [phone] ? ?Derry Lory ?05/22/2021, 4:43 PM ? ?

## 2021-05-22 NOTE — Anesthesia Procedure Notes (Signed)
Procedure Name: [place] ?Date/Time: 05/22/2021 7:40 AM ?Performed by: Michele Rockers, CRNA ?Pre-anesthesia Checklist: Patient identified, Emergency Drugs available, Suction available, Timeout performed and Patient being monitored ?Patient Re-evaluated:Patient Re-evaluated prior to induction ?Oxygen Delivery Method: Simple face mask ? ? ? ? ?

## 2021-05-22 NOTE — Plan of Care (Signed)
?  Problem: Education: ?Goal: Knowledge of the prescribed therapeutic regimen will improve ?Outcome: Progressing ?  ?Problem: Activity: ?Goal: Ability to avoid complications of mobility impairment will improve ?Outcome: Progressing ?  ?Problem: Pain Management: ?Goal: Pain level will decrease with appropriate interventions ?Outcome: Progressing ?  ?Problem: Skin Integrity: ?Goal: Will show signs of wound healing ?Outcome: Progressing ?  ?

## 2021-05-22 NOTE — Anesthesia Procedure Notes (Signed)
Anesthesia Regional Block: Adductor canal block  ? ?Pre-Anesthetic Checklist: , timeout performed,  Correct Patient, Correct Site, Correct Laterality,  Correct Procedure, Correct Position, site marked,  Risks and benefits discussed,  Surgical consent,  Pre-op evaluation,  At surgeon's request and post-op pain management ? ?Laterality: Right ? ?Prep: chloraprep     ?  ?Needles:  ?Injection technique: Single-shot ? ?Needle Type: Echogenic Stimulator Needle   ? ? ?Needle Length: 9cm  ?Needle Gauge: 21  ? ? ? ?Additional Needles: ? ? ?Procedures:,,,, ultrasound used (permanent image in chart),,    ?Narrative:  ?Start time: 05/22/2021 7:15 AM ?End time: 05/22/2021 7:20 AM ?Injection made incrementally with aspirations every 5 mL. ? ?Performed by: Personally  ?Anesthesiologist: Effie Berkshire, MD ? ?Additional Notes: ?Patient tolerated the procedure well. Local anesthetic introduced in an incremental fashion under minimal resistance after negative aspirations. No paresthesias were elicited. After completion of the procedure, no acute issues were identified and patient continued to be monitored by RN.  ? ? ? ? ? ?

## 2021-05-22 NOTE — Anesthesia Procedure Notes (Signed)
Spinal ? ?Patient location during procedure: OR ?Start time: 05/22/2021 7:50 AM ?End time: 05/22/2021 7:52 AM ?Reason for block: surgical anesthesia ?Staffing ?Performed: anesthesiologist  ?Anesthesiologist: Effie Berkshire, MD ?Preanesthetic Checklist ?Completed: patient identified, IV checked, site marked, risks and benefits discussed, surgical consent, monitors and equipment checked, pre-op evaluation and timeout performed ?Spinal Block ?Patient position: sitting ?Prep: DuraPrep and site prepped and draped ?Patient monitoring: heart rate, cardiac monitor, continuous pulse ox and blood pressure ?Approach: midline ?Location: L3-4 ?Injection technique: single-shot ?Needle ?Needle type: Sprotte  ?Needle gauge: 24 G ?Needle length: 9 cm ?Needle insertion depth: 10 cm ?Assessment ?Sensory level: T4 ?Events: CSF return ?Additional Notes ?Patient tolerated well. No immediate complications. ? ?Functioning IV was confirmed and monitors were applied. Sterile prep and drape, including hand hygiene and sterile gloves were used. The patient was positioned and the back was prepped. The skin was anesthetized with lidocaine. Free flow of clear CSF was obtained prior to injecting local anesthetic into the CSF. The spinal needle aspirated freely following injection. The needle was carefully withdrawn. The patient tolerated the procedure well. ? ? ? ? ?

## 2021-05-22 NOTE — Op Note (Signed)
NAME: Cheryl Tate, Cheryl M. ?MEDICAL RECORD NO: [id number] ?ACCOUNT NO: [id number] ?DATE OF BIRTH: [birth year] ?FACILITY: [facility] ?LOCATION: [facility] ?PHYSICIAN: Yetta Barre. Marlou Sa, MD ? ?Operative Report  ? ?DATE OF PROCEDURE: 05/22/2021 ? ? ?PREOPERATIVE DIAGNOSIS:  Right knee arthritis. ? ?POSTOPERATIVE DIAGNOSIS:  Right knee arthritis. ? ?PROCEDURE:  Right total knee replacement. ? ?SURGEON ATTENDING:  Yetta Barre. Marlou Sa, MD  ? ?ASSISTANT:  Annie Main, PA. ? ?INDICATIONS:  The patient is a 51 year old patient with end-stage right knee arthritis, who presents for operative management after explanation of risks and benefits. ? ?DESCRIPTION OF PROCEDURE:  The patient was brought to the operating room where spinal anesthetic was induced.  Preoperative antibiotics were administered.  Timeout was called.  Right leg was prescrubbed with alcohol and Betadine, allowed to air dry.   ?Prepped with DuraPrep solution and draped in sterile manner.  Charlie Pitter was used to cover the operative field.  The patient had about 7-degree flexion contracture.  Had flexion to about 120.  After calling a timeout, the leg was elevated and exsanguinated  ?with the Esmarch wrap.  Tourniquet was inflated.  Total tourniquet time was about 86 minutes.  Anterior approach to the knee was made.  Skin and subcutaneous tissue was sharply divided.  IrriSept solution utilized. Median parapatellar arthrotomy was made ? and marked with #1 Vicryl suture.  Patella was everted.  Fat pad partially excised.  Lateral patellofemoral ligament released.  Soft tissue removed from the anterior distal femur.  Medial soft tissue dissection was performed.  ACL released.  Anterior  ?horn lateral meniscus released.  The patient had severe end-stage arthritis in both the medial and patellofemoral compartments.  Next, the intramedullary alignment was used to make a cut perpendicular to the mechanical axis. Cut was initially made 9 mm  ?off the least affected lateral tibial plateau  later revised 2 more millimeters.  Initially using intramedullary alignment 5 degrees of valgus, an 8 mm cut was made off the distal femur, later revised to 10 mm.  This allowed for an 11 spacer to sit within ? the extension space.  Femur was sized to a size 2, tibia was size 3.  Anterior, posterior and chamfer cuts were made and approximately 3 degrees of external rotation, which gave [place] symmetric flexion and extension gap.  Tibial tray was then placed and  ?the femur trial was placed.  The patient achieved full extension with 11 mm trial spacer.  Patella was then cut down from 21 to 11 mm.  A 32 mm 3-peg trial patella was placed.  Bone quality was excellent.  With trial components in position, the patient  ?had full extension, full flexion with good stability to varus and valgus stress at 0, 30 and 90 degrees. Flexion and extension gaps were equal. Trial components removed.  Tibia was keel punched.  Final preparation was made on the femur.  Thorough  ?irrigation was then performed with 3 L of irrigating solution.  Next, the true components were placed, but we upsized the 11 mm spacer to a 12 mm spacer for added stability at 30 degrees of extension.  This gave no liftoff.  Excellent press fit obtained. ?  The following implantation of the components, the tourniquet was released.  Bleeding points encountered controlled using electrocautery.  The patient had full extension, full flexion, and very good patellar tracking with no thumbs technique.  Bleeding  ?points encountered controlled using electrocautery, pouring irrigation was then utilized.  It should be noted that prior to implanting the  true implants, we did allow the incision to soak and was soaked with tranexamic acid sponge for 3 minutes.  Also,  ?anesthetized the capsule using Marcaine, Exparel and saline.  Next, the vancomycin powder was also added into the tibial canal prior to placement.  At this time, after placement of the implants, the thorough  irrigation was performed.  Arthrotomy was  ?closed over bolster using #1 Vicryl suture.  IrriSept solution utilized after the initial arthrotomy as well as at the closure of the arthrotomy and we also placed vancomycin powder into the joint.  Next, the final arthrotomy closure was performed.  The  ?incision was then closed using interrupted inverted 0 Vicryl suture, 2-0 Vicryl suture, and 3-0 Monocryl with Steri-Strips and Aquacel dressing applied.  It should be noted also that prior to final skin closure, we injected the knee with solution of  ?Marcaine, morphine, clonidine for added pain relief.  The patient tolerated the procedure well without immediate complications.  Knee was wrapped with Ace wrap, iceman and knee immobilizer and she was transferred to recovery room.  Luke's assistance was  ?required at all times during the case for opening, limb positioning, retractor placement, retractor holding,  drilling pins.  His assistance was required at all times during the case for preparation of the femur and the tibia and the patella.  His  ?assistance was required during opening and closing.  There was no time during the case for his assistance was not required.  ? ? ? ?[place] ?D: 05/22/2021 11:26:38 am T: 05/22/2021 10:56:00 pm  ?JOB: [id number]/ [id number]  ?

## 2021-05-23 ENCOUNTER — Ambulatory Visit: Payer: No Typology Code available for payment source | Admitting: Orthopedic Surgery

## 2021-05-23 ENCOUNTER — Encounter (HOSPITAL_COMMUNITY): Payer: Self-pay | Admitting: Orthopedic Surgery

## 2021-05-23 DIAGNOSIS — F32A Depression, unspecified: Secondary | ICD-10-CM | POA: Diagnosis present

## 2021-05-23 DIAGNOSIS — D6800 Von Willebrand disease, unspecified: Secondary | ICD-10-CM | POA: Diagnosis present

## 2021-05-23 DIAGNOSIS — F17210 Nicotine dependence, cigarettes, uncomplicated: Secondary | ICD-10-CM | POA: Diagnosis present

## 2021-05-23 DIAGNOSIS — M7989 Other specified soft tissue disorders: Secondary | ICD-10-CM | POA: Diagnosis not present

## 2021-05-23 DIAGNOSIS — Z9049 Acquired absence of other specified parts of digestive tract: Secondary | ICD-10-CM | POA: Diagnosis not present

## 2021-05-23 DIAGNOSIS — Z810 Family history of intellectual disabilities: Secondary | ICD-10-CM | POA: Diagnosis not present

## 2021-05-23 DIAGNOSIS — M1711 Unilateral primary osteoarthritis, right knee: Secondary | ICD-10-CM | POA: Diagnosis present

## 2021-05-23 LAB — CBC
HCT: 26.8 % — ABNORMAL LOW (ref 36.0–46.0)
Hemoglobin: 9.3 g/dL — ABNORMAL LOW (ref 12.0–15.0)
MCH: 31.1 pg (ref 26.0–34.0)
MCHC: 34.7 g/dL (ref 30.0–36.0)
MCV: 89.6 fL (ref 80.0–100.0)
Platelets: 233 10*3/uL (ref 150–400)
RBC: 2.99 MIL/uL — ABNORMAL LOW (ref 3.87–5.11)
RDW: 11.9 % (ref 11.5–15.5)
WBC: 10.7 10*3/uL — ABNORMAL HIGH (ref 4.0–10.5)
nRBC: 0 % (ref 0.0–0.2)

## 2021-05-23 MED ORDER — ESTRADIOL 1 MG PO TABS
0.5000 mg | ORAL_TABLET | Freq: Every day | ORAL | Status: DC
  Administered 2021-05-23 – 2021-05-24 (×2): 0.5 mg via ORAL
  Filled 2021-05-23 (×2): qty 0.5

## 2021-05-23 MED ORDER — OXYCODONE HCL 5 MG PO TABS: 5.0000 mg | ORAL_TABLET | ORAL | Status: DC | PRN

## 2021-05-23 NOTE — Progress Notes (Signed)
Physical Therapy Treatment ?Patient Details ?Name: Cheryl Tate [facility] ?MRN: [id number] ?DOB: Aug 19, 1970 ?Today's Date: 05/23/2021 ? ? ?History of Present Illness Pt is a 51 y.o. female s/p elective R TKA on 05/22/21. PMH includes depression. ? ?  ?PT Comments  ? ? Pt received supine and agreeable to session despite max c/o pain and session coordinated around premedication. Pt requiring min a for bed mobility secondary to pain, nausea, and generalized weakness. Pt able to come to standing with min guard for safety and ambulate a few steps to recliner. Upon standing pt with increased c/o nausea, dizziness, and stating having "hot flash", RN notified. Vitals upon sitting: BP 135/79, HR 67, O2 97%, educated pt on importance of nutrition, hydration and continued mobility. Educated pt re AP 20x/HR pt able to demonstrate back. Pt declining further therex. Plan to continue to increase ambulation tolerance in afternoon session. Pt continues to benefit from skilled PT services to progress toward functional mobility goals.   ?  ?Recommendations for follow up therapy are one component of a multi-disciplinary discharge planning process, led by the attending physician.  Recommendations may be updated based on patient status, additional functional criteria and insurance authorization. ? ?Follow Up Recommendations ? Follow physician's recommendations for discharge plan and follow up therapies ?  ?  ?Assistance Recommended at Discharge Intermittent Supervision/Assistance  ?Patient can return home with the following A little help with bathing/dressing/bathroom;Assistance with cooking/housework;Assist for transportation;Help with stairs or ramp for entrance ?  ?Equipment Recommendations ? None recommended by PT  ?  ?Recommendations for Other Services   ? ? ?  ?Precautions / Restrictions Precautions ?Precautions: Knee;Fall ?Restrictions ?Weight Bearing Restrictions: Yes ?RLE Weight Bearing: Weight bearing as tolerated  ?  ? ?Mobility ?  Bed Mobility ?Overal bed mobility: Needs Assistance ?Bed Mobility: Supine to Sit ?  ?  ?Supine to sit: Min assist ?  ?  ?General bed mobility comments: min assist for LE management and trunk elevation, step by step cues needed to come from sidelynig to sit ?  ? ?Transfers ?Overall transfer level: Needs assistance ?Equipment used: Rolling walker (2 wheels) ?Transfers: Sit to/from Stand, Bed to chair/wheelchair/BSC ?Sit to Stand: Min guard ?  ?  ?  ?  ?  ?General transfer comment: cues for hand placement, min guard for balance; ?  ? ?Ambulation/Gait ?Ambulation/Gait assistance: Min assist ?Gait Distance (Feet): 2 Feet ?Assistive device: Rolling walker (2 wheels) ?Gait Pattern/deviations: Step-to pattern, Decreased stance time - right, Decreased weight shift to right, Antalgic, Trunk flexed ?Gait velocity: decr ?  ?  ?General Gait Details: able to take a few steps from EOB to recliner, max c/o pain, nausea, dizziness, stating she was having hot flash, furhter ambulation defered, BP stable 135/79, O2 97, HR 67 ? ? ?Stairs ?  ?  ?  ?  ?  ? ? ?Wheelchair Mobility ?  ? ?Modified Rankin (Stroke Patients Only) ?  ? ? ?  ?Balance Overall balance assessment: Needs assistance ?Sitting-balance support: No upper extremity supported, Feet supported ?Sitting balance-Leahy Scale: Good ?  ?  ?Standing balance support: Reliant on assistive device for balance ?Standing balance-Leahy Scale: Poor ?  ?  ?  ?  ?  ?  ?  ?  ?  ?  ?  ?  ?  ? ?  ?Cognition Arousal/Alertness: Awake/alert ?Behavior During Therapy: Flat affect, Anxious ?Overall Cognitive Status: Within Functional Limits for tasks assessed ?  ?  ?  ?  ?  ?  ?  ?  ?  ?  ?  ?  ?  ?  ?  ?  ?  General Comments: anxious with mobility ?  ?  ? ?  ?Exercises   ? ?  ?General Comments   ?  ?  ? ?Pertinent Vitals/Pain Pain Assessment ?Pain Assessment: 0-10 ?Pain Score: 10-Worst pain ever ?Pain Location: R knee ?Pain Descriptors / Indicators: Discomfort, Grimacing, Guarding ?Pain  Intervention(s): Limited activity within patient's tolerance, Monitored during session, Repositioned, Ice applied, Premedicated before session  ? ? ?Home Living   ?  ?  ?  ?  ?  ?  ?  ?  ?  ?   ?  ?Prior Function    ?  ?  ?   ? ?PT Goals (current goals can now be found in the care plan section) Acute Rehab PT Goals ?Patient Stated Goal: "When can I get back to yoga?" ?PT Goal Formulation: With patient ?Time For Goal Achievement: 06/05/21 ? ?  ?Frequency ? ? ? 7X/week ? ? ? ?  ?PT Plan    ? ? ?Co-evaluation   ?  ?  ?  ?  ? ?  ?AM-PAC PT "6 Clicks" Mobility   ?Outcome Measure ? Help needed turning from your back to your side while in a flat bed without using bedrails?: A Little ?Help needed moving from lying on your back to sitting on the side of a flat bed without using bedrails?: A Little ?Help needed moving to and from a bed to a chair (including a wheelchair)?: A Lot ?Help needed standing up from a chair using your arms (e.g., wheelchair or bedside chair)?: A Little ?Help needed to walk in hospital room?: A Lot ?Help needed climbing 3-5 steps with a railing? : Total ?6 Click Score: 14 ? ?  ?End of Session Equipment Utilized During Treatment: Gait belt;Right knee immobilizer ?Activity Tolerance: Other (comment) (limited by nausea) ?Patient left: in chair;with call bell/phone within reach;with chair alarm set ?Nurse Communication: Mobility status ?PT Visit Diagnosis: Other abnormalities of gait and mobility (R26.89);Pain ?Pain - Right/Left: Right ?Pain - part of body: Knee ?  ? ? ?Time: 3810-1751 ?PT Time Calculation (min) (ACUTE ONLY): 28 min ? ?Charges:  $Gait Training: 8-22 mins ?$Therapeutic Activity: 8-22 mins  ? ?         ?Audry Riles. PTA ?Acute Rehabilitation Services ?Office: [phone]  ? ? ? ?Betsey Holiday Katty Fretwell ?05/23/2021, 10:00 AM ? ?

## 2021-05-23 NOTE — Progress Notes (Signed)
Physical Therapy Treatment ?Patient Details ?Name: Cheryl Tate [facility] ?MRN: [id number] ?DOB: 03/26/1970 ?Today's Date: 05/23/2021 ? ? ?History of Present Illness Pt is a 51 y.o. female s/p elective R TKA on 05/22/21. PMH includes depression. ? ?  ?PT Comments  ? ? Pt received supine and agreeable to OOB transfer to [facility]. Pt min a for bed mobility for LE management. Pt able to come to standing EOB with min assist to steady on rise and pivot to<>from BSC. Pt declining further ambulation secondary to pain despite max encouragement, pt agreeable to bed level therex for increased ROM and strength. Pt continues to benefit from skilled PT services to progress toward functional mobility goals.   ?  ?Recommendations for follow up therapy are one component of a multi-disciplinary discharge planning process, led by the attending physician.  Recommendations may be updated based on patient status, additional functional criteria and insurance authorization. ? ?Follow Up Recommendations ? Follow physician's recommendations for discharge plan and follow up therapies ?  ?  ?Assistance Recommended at Discharge Intermittent Supervision/Assistance  ?Patient can return home with the following A little help with bathing/dressing/bathroom;Assistance with cooking/housework;Assist for transportation;Help with stairs or ramp for entrance ?  ?Equipment Recommendations ? None recommended by PT  ?  ?Recommendations for Other Services   ? ? ?  ?Precautions / Restrictions Precautions ?Precautions: Knee;Fall ?Restrictions ?Weight Bearing Restrictions: Yes ?RLE Weight Bearing: Weight bearing as tolerated  ?  ? ?Mobility ? Bed Mobility ?Overal bed mobility: Needs Assistance ?Bed Mobility: Supine to Sit ?  ?  ?Supine to sit: Min assist ?  ?  ?General bed mobility comments: min assist for LE management ?  ? ?Transfers ?Overall transfer level: Needs assistance ?Equipment used: 1 person hand held assist ?Transfers: Sit to/from Stand, Bed to  chair/wheelchair/BSC ?Sit to Stand: Min assist ?Stand pivot transfers: Min assist ?  ?  ?  ?  ?General transfer comment: cues for hand placement, min assist for balance; ?  ? ?Ambulation/Gait ?Ambulation/Gait assistance: Min assist ?Gait Distance (Feet): 2 Feet ?Assistive device: Rolling walker (2 wheels) ?Gait Pattern/deviations: Step-to pattern, Decreased stance time - right, Decreased weight shift to right, Antalgic, Trunk flexed ?Gait velocity: decr ?  ?  ?General Gait Details: pt declined ? ? ?Stairs ?  ?  ?  ?  ?  ? ? ?Wheelchair Mobility ?  ? ?Modified Rankin (Stroke Patients Only) ?  ? ? ?  ?Balance Overall balance assessment: Needs assistance ?Sitting-balance support: No upper extremity supported, Feet supported ?Sitting balance-Leahy Scale: Good ?  ?  ?Standing balance support: Reliant on assistive device for balance ?Standing balance-Leahy Scale: Poor ?  ?  ?  ?  ?  ?  ?  ?  ?  ?  ?  ?  ?  ? ?  ?Cognition Arousal/Alertness: Awake/alert ?Behavior During Therapy: Flat affect, Anxious ?Overall Cognitive Status: Within Functional Limits for tasks assessed ?  ?  ?  ?  ?  ?  ?  ?  ?  ?  ?  ?  ?  ?  ?  ?  ?General Comments: anxious with mobility ?  ?  ? ?  ?Exercises Total Joint Exercises ?Ankle Circles/Pumps: AROM, Right, Seated ?Quad Sets: AROM, Right, 10 reps, Supine ?Short Arc Quad: AROM, AAROM, Right, 10 reps, Supine ?Heel Slides: AROM, Right, 10 reps, Supine ?Hip ABduction/ADduction: AROM, AAROM, Right, 10 reps, Supine ?Straight Leg Raises: AAROM, Right, 5 reps, Supine ? ?  ?General Comments   ?  ?  ? ?  Pertinent Vitals/Pain Pain Assessment ?Pain Assessment: 0-10 ?Pain Score: 9  ?Pain Location: R knee ?Pain Descriptors / Indicators: Discomfort, Grimacing, Guarding ?Pain Intervention(s): Limited activity within patient's tolerance, Monitored during session, Repositioned, Ice applied, Premedicated before session  ? ? ?Home Living   ?  ?  ?  ?  ?  ?  ?  ?  ?  ?   ?  ?Prior Function    ?  ?  ?   ? ?PT Goals  (current goals can now be found in the care plan section) Acute Rehab PT Goals ?Patient Stated Goal: "When can I get back to yoga?" ?PT Goal Formulation: With patient ?Time For Goal Achievement: 06/05/21 ? ?  ?Frequency ? ? ? 7X/week ? ? ? ?  ?PT Plan    ? ? ?Co-evaluation   ?  ?  ?  ?  ? ?  ?AM-PAC PT "6 Clicks" Mobility   ?Outcome Measure ? Help needed turning from your back to your side while in a flat bed without using bedrails?: A Little ?Help needed moving from lying on your back to sitting on the side of a flat bed without using bedrails?: A Little ?Help needed moving to and from a bed to a chair (including a wheelchair)?: A Lot ?Help needed standing up from a chair using your arms (e.g., wheelchair or bedside chair)?: A Little ?Help needed to walk in hospital room?: A Lot ?Help needed climbing 3-5 steps with a railing? : Total ?6 Click Score: 14 ? ?  ?End of Session Equipment Utilized During Treatment: Gait belt;Right knee immobilizer ?Activity Tolerance: Other (comment) (limited by nausea) ?Patient left: in bed;with call bell/phone within reach;Other (comment) (in bone foam) ?Nurse Communication: Mobility status ?PT Visit Diagnosis: Other abnormalities of gait and mobility (R26.89);Pain ?Pain - Right/Left: Right ?Pain - part of body: Knee ?  ? ? ?Time: 1610-9604 ?PT Time Calculation (min) (ACUTE ONLY): 20 min ? ?Charges:  $Therapeutic Exercise: 8-22 mins          ?          ? ?Audry Riles. PTA ?Acute Rehabilitation Services ?Office: [phone] ? ? ? ?Betsey Holiday Adrien Shankar ?05/23/2021, 2:57 PM ? ?

## 2021-05-23 NOTE — Progress Notes (Signed)
PT Cancellation Note ? ?Patient Details ?Name: Cheryl Tate [facility] ?MRN: [id number] ?DOB: 07-Mar-1970 ? ? ?Cancelled Treatment:    Reason Eval/Treat Not Completed: Patient declined, no reason specified, pt declining mobility reporting 10/10 sciatic nerve pain, RN notified. Will check back/re-attempt as schedule allows to continue with PT POC. ? ? ? ?[name] ?05/23/2021, 1:41 PM ?

## 2021-05-23 NOTE — Progress Notes (Signed)
Pt vomited most recent pain medications given.  Will now give Zofran and Dilaudid ?

## 2021-05-23 NOTE — Progress Notes (Signed)
?  Subjective: ?Cheryl Tate is a 51 y.o. female s/p right TKA.  They are POD 1.  Pt's pain is moderate but controlled.  She does complain of some low back pain which is uncharacteristic for her.  Does not really have a history of low back pain.  She denies any radicular pain down either leg.  She denies any chest pain or shortness of breath.  She has felt nauseous intermittently but this is improved today.  She has some mild abdominal pain and has not had a bowel movement yet.  No vomiting episodes.  She has not ambulated yet but she did work with physical therapy yesterday and was able to stand up at the bedside.  She did have some dizziness with standing up. ? ?Objective: ?Vital signs in last 24 hours: ?Temp:  [97.5 ?F (36.4 ?C)-98.9 ?F (37.2 ?C)] 98.2 ?F (36.8 ?C) (05/03 0546) ?Pulse Rate:  [58-80] 72 (05/03 0546) ?Resp:  [11-18] 14 (05/03 0546) ?BP: (105-135)/(64-92) 135/84 (05/03 0546) ?SpO2:  [98 %-100 %] 99 % (05/03 0546) ? ?Intake/Output from previous day: ?05/02 0701 - 05/03 0700 ?In: 0177 [I.V.:1200; IV Piggyback:250] ?Out: 950 [Urine:800; Blood:150] ?Intake/Output this shift: ?No intake/output data recorded. ? ?Exam: ? ?No gross blood or drainage overlying the dressing ?2+ DP pulse ?Able to dorsiflex and plantarflex the right foot ?No calf tenderness.  Negative Homans' sign.  Patient's quad contracts but cannot perform straight leg raise. ? ? ?Labs: ?Recent Labs  ?  05/23/21 ?9390  ?HGB 9.3*  ? ?Recent Labs  ?  05/23/21 ?3009  ?WBC 10.7*  ?RBC 2.99*  ?HCT 26.8*  ?PLT 233  ? ?No results for input(s): NA, K, CL, CO2, BUN, CREATININE, GLUCOSE, CALCIUM in the last 72 hours. ?No results for input(s): LABPT, INR in the last 72 hours. ? ?Assessment/Plan: ?Pt is POD 1 s/p right TKA.   ? -Plan to discharge to home in coming days pending patient's pain and PT eval.  Patient would like to wait until tomorrow and with her starting Xarelto later today around 1 PM, would be good to check her hemoglobin to  make sure it stabilizes tomorrow morning before she discharges.  Recommended Derya let her nurse know if her abdominal pain worsens or she notices that she is not passing any gas today. ? -WBAT with a walker  ? ? ?Gerrianne Scale Garry Bochicchio ?05/23/2021, 8:16 AM  ? ? ?   ?

## 2021-05-23 NOTE — Plan of Care (Signed)

## 2021-05-24 ENCOUNTER — Other Ambulatory Visit (HOSPITAL_COMMUNITY): Payer: Self-pay

## 2021-05-24 LAB — CBC
HCT: 28.5 % — ABNORMAL LOW (ref 36.0–46.0)
Hemoglobin: 10.1 g/dL — ABNORMAL LOW (ref 12.0–15.0)
MCH: 30.6 pg (ref 26.0–34.0)
MCHC: 35.4 g/dL (ref 30.0–36.0)
MCV: 86.4 fL (ref 80.0–100.0)
Platelets: 265 10*3/uL (ref 150–400)
RBC: 3.3 MIL/uL — ABNORMAL LOW (ref 3.87–5.11)
RDW: 11.8 % (ref 11.5–15.5)
WBC: 9 10*3/uL (ref 4.0–10.5)
nRBC: 0 % (ref 0.0–0.2)

## 2021-05-24 MED ORDER — TRAMADOL HCL 50 MG PO TABS
50.0000 mg | ORAL_TABLET | Freq: Four times a day (QID) | ORAL | Status: DC
Start: 2021-05-24 — End: 2021-05-25
  Administered 2021-05-24 – 2021-05-25 (×6): 50 mg via ORAL
  Filled 2021-05-24 (×6): qty 1

## 2021-05-24 MED ORDER — RIVAROXABAN 10 MG PO TABS
10.0000 mg | ORAL_TABLET | Freq: Every day | ORAL | Status: DC
  Administered 2021-05-24 – 2021-05-25 (×2): 10 mg via ORAL
  Filled 2021-05-24 (×2): qty 1

## 2021-05-24 MED ORDER — HYDROMORPHONE HCL 2 MG PO TABS
1.0000 mg | ORAL_TABLET | ORAL | Status: DC | PRN
  Administered 2021-05-24: 2 mg via ORAL
  Filled 2021-05-24: qty 1

## 2021-05-24 MED ORDER — GABAPENTIN 300 MG PO CAPS
300.0000 mg | ORAL_CAPSULE | Freq: Two times a day (BID) | ORAL | Status: DC
  Administered 2021-05-24 – 2021-05-25 (×3): 300 mg via ORAL
  Filled 2021-05-24 (×3): qty 1

## 2021-05-24 NOTE — Progress Notes (Signed)
?  Subjective: ?Patient stable.  Still having expected amount of postop pain.  Slow to mobilize.  Has not been in the CPM.  Patient was having nausea which I think is associated with her pain medicine.  Back pain has improved.  Patient is eating.  ? ?Objective: ?Vital signs in last 24 hours: ?Temp:  [98 ?F (36.7 ?C)-98.7 ?F (37.1 ?C)] 98 ?F (36.7 ?C) (05/04 0818) ?Pulse Rate:  [70-80] 76 (05/04 0818) ?Resp:  [16-20] 17 (05/04 0818) ?BP: (134-148)/(84-90) 134/84 (05/04 0818) ?SpO2:  [98 %-100 %] 100 % (05/04 0818) ? ?Intake/Output from previous day: ?05/03 0701 - 05/04 0700 ?In: 265.3 [I.V.:265.3] ?Out: 6 [Urine:2100; Emesis/NG output:500] ?Intake/Output this shift: ?No intake/output data recorded. ? ?Exam: ? ?Sensation intact distally ?Intact pulses distally ?Dorsiflexion/Plantar flexion intact ? ?Labs: ?Recent Labs  ?  05/23/21 ?1884 05/24/21 ?0211  ?HGB 9.3* 10.1*  ? ?Recent Labs  ?  05/23/21 ?1660 05/24/21 ?0211  ?WBC 10.7* 9.0  ?RBC 2.99* 3.30*  ?HCT 26.8* 28.5*  ?PLT 233 265  ? ?No results for input(s): NA, K, CL, CO2, BUN, CREATININE, GLUCOSE, CALCIUM in the last 72 hours. ?No results for input(s): LABPT, INR in the last 72 hours. ? ?Assessment/Plan: ?Impression is postop day 2 from right total knee replacement in a patient with von Willebrand's disease.  Hemoglobin is stable.  Plan to start Xarelto today.  We will DC oral Dilaudid and keep IV Dilaudid as needed for increased pain.  Also plan to add Ultram and gabapentin as agents for postop pain relief.  Continue also with Tylenol.  Patient cannot take anti-inflammatories.  Anticipate evaluation for discharge tomorrow. ? ? ?Anderson Malta ?05/24/2021, 11:40 AM  ? ? ?  ?

## 2021-05-24 NOTE — Progress Notes (Signed)
Physical Therapy Treatment ?Patient Details ?Name: Cheryl [facility] ?MRN: [id number] ?DOB: 05-30-1970 ?Today's Date: 05/24/2021 ? ? ?History of Present Illness Pt is a 51 y.o. female s/p elective R TKA on 05/22/21. PMH includes depression. ? ?  ?PT Comments  ? ? Pt received supine and agreeable to PM session with good progress towards all goals. Pt able to ascend/descend 3 steps in stairwell for 2 trials with min assist to power up and stabilize. Pt with increased ambulation tolerance this session demonstrating ability to ambulate home distances. Pt educated on energy conservation techniques, safe car entry/exit , HEP compliance, icing, and progression of mobility, pt verbalizing understanding. Anticipate safe discharge with family assistance once medically cleared, will follow acutely. Pt continues to benefit from skilled PT services to progress toward functional mobility goals.  ? ?  ?Recommendations for follow up therapy are one component of a multi-disciplinary discharge planning process, led by the attending physician.  Recommendations may be updated based on patient status, additional functional criteria and insurance authorization. ? ?Follow Up Recommendations ? Follow physician's recommendations for discharge plan and follow up therapies ?  ?  ?Assistance Recommended at Discharge Intermittent Supervision/Assistance  ?Patient can return home with the following A little help with bathing/dressing/bathroom;Assistance with cooking/housework;Assist for transportation;Help with stairs or ramp for entrance ?  ?Equipment Recommendations ? None recommended by PT  ?  ?Recommendations for Other Services   ? ? ?  ?Precautions / Restrictions Precautions ?Precautions: Knee;Fall ?Restrictions ?Weight Bearing Restrictions: Yes ?RLE Weight Bearing: Weight bearing as tolerated  ?  ? ?Mobility ? Bed Mobility ?Overal bed mobility: Needs Assistance ?Bed Mobility: Sit to Supine, Supine to Sit ?  ?  ?Supine to sit: Min assist ?Sit  to supine: Min assist ?  ?General bed mobility comments: min assist for LE management ?  ? ?Transfers ?Overall transfer level: Needs assistance ?Equipment used: Rolling walker (2 wheels) ?Transfers: Sit to/from Stand, Bed to chair/wheelchair/BSC ?Sit to Stand: Supervision, Min guard ?  ?  ?  ?  ?  ?General transfer comment: pt impulsively standing from recliner, close guard for safety ?  ? ?Ambulation/Gait ?Ambulation/Gait assistance: Min guard ?Gait Distance (Feet): 130 Feet ?Assistive device: Rolling walker (2 wheels) ?Gait Pattern/deviations: Step-to pattern, Decreased stance time - right, Decreased weight shift to right, Antalgic, Trunk flexed ?Gait velocity: decr ?  ?  ?General Gait Details: slow steady gait on RW, no LOB, good recall of sequencing, light cues for trunk elevation ? ? ?Stairs ?Stairs: Yes ?Stairs assistance: Min assist ?Stair Management: One rail Right, Step to pattern, Forwards, Sideways ?Number of Stairs: 6 ?General stair comments: up/down 3 steps x2, min assist as pt stating arm weakness and noted buckling of RLE, pt with good recall of sequencing no LOB ? ? ?Wheelchair Mobility ?  ? ?Modified Rankin (Stroke Patients Only) ?  ? ? ?  ?Balance Overall balance assessment: Needs assistance ?Sitting-balance support: No upper extremity supported, Feet supported ?Sitting balance-Leahy Scale: Good ?  ?  ?Standing balance support: Reliant on assistive device for balance ?Standing balance-Leahy Scale: Fair ?  ?  ?  ?  ?  ?  ?  ?  ?  ?  ?  ?  ?  ? ?  ?Cognition Arousal/Alertness: Awake/alert ?Behavior During Therapy: Flat affect, Anxious ?Overall Cognitive Status: Within Functional Limits for tasks assessed ?  ?  ?  ?  ?  ?  ?  ?  ?  ?  ?  ?  ?  ?  ?  ?  ?  General Comments: anxious with mobility ?  ?  ? ?  ?Exercises Total Joint Exercises ?Quad Sets: AROM, Right, 10 reps, Supine ?Heel Slides: AROM, Right, 10 reps, Supine ?Hip ABduction/ADduction: AROM, AAROM, Right, 10 reps, Supine ?Knee Flexion: AROM,  AAROM, Right, 10 reps, Seated ? ?  ?General Comments   ?  ?  ? ?Pertinent Vitals/Pain Pain Assessment ?Pain Assessment: Faces ?Faces Pain Scale: Hurts a little bit ?Pain Location: R knee ?Pain Descriptors / Indicators: Discomfort, Grimacing, Guarding ?Pain Intervention(s): Limited activity within patient's tolerance, Monitored during session, Repositioned  ? ? ?Home Living   ?  ?  ?  ?  ?  ?  ?  ?  ?  ?   ?  ?Prior Function    ?  ?  ?   ? ?PT Goals (current goals can now be found in the care plan section) Acute Rehab PT Goals ?Patient Stated Goal: "When can I get back to yoga?" ?PT Goal Formulation: With patient ?Time For Goal Achievement: 06/05/21 ? ?  ?Frequency ? ? ? 7X/week ? ? ? ?  ?PT Plan    ? ? ?Co-evaluation   ?  ?  ?  ?  ? ?  ?AM-PAC PT "6 Clicks" Mobility   ?Outcome Measure ? Help needed turning from your back to your side while in a flat bed without using bedrails?: A Little ?Help needed moving from lying on your back to sitting on the side of a flat bed without using bedrails?: A Little ?Help needed moving to and from a bed to a chair (including a wheelchair)?: A Little ?Help needed standing up from a chair using your arms (e.g., wheelchair or bedside chair)?: A Little ?Help needed to walk in hospital room?: A Little ?Help needed climbing 3-5 steps with a railing? : A Lot ?6 Click Score: 17 ? ?  ?End of Session Equipment Utilized During Treatment: Gait belt;Right knee immobilizer ?Activity Tolerance: Other (comment) (limited by nausea) ?Patient left: in bed;with call bell/phone within reach;Other (comment);with bed alarm set (in bone foam) ?Nurse Communication: Mobility status ?PT Visit Diagnosis: Other abnormalities of gait and mobility (R26.89);Pain ?Pain - Right/Left: Right ?Pain - part of body: Knee ?  ? ? ?Time: 4287-6811 ?PT Time Calculation (min) (ACUTE ONLY): 37 min ? ?Charges:  $Gait Training: 23-37 mins          ?          ?Audry Riles. PTA ?Acute Rehabilitation Services ?Office:  [phone] ? ? ?[name] ?05/24/2021, 4:40 PM ? ?

## 2021-05-24 NOTE — TOC Benefit Eligibility Note (Signed)
Patient Advocate Encounter ? ?Insurance verification completed.   ? ?The patient is currently admitted and upon discharge could be taking Xarelto 35m tablets. ? ?The current 30 day co-pay is, $516.33.  ? ?The patient is insured through [employer]? ?CLady Deutscher CPhT-Adv ?Pharmacy Patient Advocate Specialist ?[place] ?Direct Number: [phone] Fax: [phone] ? ? ? ? ? ? ?

## 2021-05-24 NOTE — Plan of Care (Signed)

## 2021-05-24 NOTE — Progress Notes (Signed)
Physical Therapy Treatment ?Patient Details ?Name: Cheryl Tate ?MRN: [id number] ?DOB: Nov 25, 1970 ?Today's Date: 05/24/2021 ? ? ?History of Present Illness Pt is a 51 y.o. female s/p elective R TKA on 05/22/21. PMH includes depression. ? ?  ?PT Comments  ? ? Pt received OOB in recliner with slow but steady progress towards goals. Pt min guard to supervision to come to standing without AD. Pt min guard throughout ambulation with good recall of sequencing and demonstrating good stability. Distance limited to pt stated tolerance secondary to nausea and dizziness. VSS throughout session, continued education with pt on importance of nutrition and hydration. Pt with fair tolerance for LE therex for increased ROM and strength. Pt agreeable to CPM use at end of session. Plan to progress gait and initiate stair training for safe d/c to home. Pt continues to benefit from skilled PT services to progress toward functional mobility goals.   ? ?Seated BP: 133/85 ?Standing BP: 128/104 ?After ambulation BP: 133/82 ?  ?Recommendations for follow up therapy are one component of a multi-disciplinary discharge planning process, led by the attending physician.  Recommendations may be updated based on patient status, additional functional criteria and insurance authorization. ? ?Follow Up Recommendations ? Follow physician's recommendations for discharge plan and follow up therapies ?  ?  ?Assistance Recommended at Discharge Intermittent Supervision/Assistance  ?Patient can return home with the following A little help with bathing/dressing/bathroom;Assistance with cooking/housework;Assist for transportation;Help with stairs or ramp for entrance ?  ?Equipment Recommendations ? None recommended by PT  ?  ?Recommendations for Other Services   ? ? ?  ?Precautions / Restrictions Precautions ?Precautions: Knee;Fall ?Restrictions ?Weight Bearing Restrictions: Yes ?RLE Weight Bearing: Weight bearing as tolerated  ?  ? ?Mobility ? Bed  Mobility ?Overal bed mobility: Needs Assistance ?Bed Mobility: Sit to Supine ?  ?  ?  ?Sit to supine: Min assist ?  ?General bed mobility comments: min assist for LE management ?  ? ?Transfers ?Overall transfer level: Needs assistance ?Equipment used: None ?Transfers: Sit to/from Stand, Bed to chair/wheelchair/BSC ?Sit to Stand: Supervision ?  ?  ?  ?  ?  ?General transfer comment: pt impulsively standing from recliner, close guard for safety ?  ? ?Ambulation/Gait ?Ambulation/Gait assistance: Min assist, Min guard (assist for chair follow) ?Gait Distance (Feet): 35 Feet ?Assistive device: Rolling walker (2 wheels) ?Gait Pattern/deviations: Step-to pattern, Decreased stance time - right, Decreased weight shift to right, Antalgic, Trunk flexed ?Gait velocity: decr ?  ?  ?General Gait Details: slow steady gait on RW, no LOB, good recall of sequencing, light cues for trunk elevation, distance to pt stated tolerance, limited secondary to c/o dizziness and nausea ? ? ?Stairs ?  ?  ?  ?  ?  ? ? ?Wheelchair Mobility ?  ? ?Modified Rankin (Stroke Patients Only) ?  ? ? ?  ?Balance Overall balance assessment: Needs assistance ?Sitting-balance support: No upper extremity supported, Feet supported ?Sitting balance-Leahy Scale: Good ?  ?  ?Standing balance support: Reliant on assistive device for balance ?Standing balance-Leahy Scale: Fair ?  ?  ?  ?  ?  ?  ?  ?  ?  ?  ?  ?  ?  ? ?  ?Cognition Arousal/Alertness: Awake/alert ?Behavior During Therapy: Flat affect, Anxious ?Overall Cognitive Status: Within Functional Limits for tasks assessed ?  ?  ?  ?  ?  ?  ?  ?  ?  ?  ?  ?  ?  ?  ?  ?  ?  General Comments: anxious with mobility ?  ?  ? ?  ?Exercises Total Joint Exercises ?Quad Sets: AROM, Right, 10 reps, Supine ?Heel Slides: AROM, Right, 10 reps, Supine ?Hip ABduction/ADduction: AROM, AAROM, Right, 10 reps, Supine ? ?  ?General Comments   ?  ?  ? ?Pertinent Vitals/Pain Pain Assessment ?Pain Assessment: Faces ?Faces Pain Scale:  Hurts little more ?Pain Location: R knee ?Pain Descriptors / Indicators: Discomfort, Grimacing, Guarding ?Pain Intervention(s): Limited activity within patient's tolerance, Monitored during session, Repositioned, Ice applied  ? ? ?Home Living   ?  ?  ?  ?  ?  ?  ?  ?  ?  ?   ?  ?Prior Function    ?  ?  ?   ? ?PT Goals (current goals can now be found in the care plan section) Acute Rehab PT Goals ?Patient Stated Goal: "When can I get back to yoga?" ?PT Goal Formulation: With patient ?Time For Goal Achievement: 06/05/21 ? ?  ?Frequency ? ? ? 7X/week ? ? ? ?  ?PT Plan    ? ? ?Co-evaluation   ?  ?  ?  ?  ? ?  ?AM-PAC PT "6 Clicks" Mobility   ?Outcome Measure ? Help needed turning from your back to your side while in a flat bed without using bedrails?: A Little ?Help needed moving from lying on your back to sitting on the side of a flat bed without using bedrails?: A Little ?Help needed moving to and from a bed to a chair (including a wheelchair)?: A Little ?Help needed standing up from a chair using your arms (e.g., wheelchair or bedside chair)?: A Little ?Help needed to walk in Tate room?: A Little ?Help needed climbing 3-5 steps with a railing? : A Lot ?6 Click Score: 17 ? ?  ?End of Session Equipment Utilized During Treatment: Gait belt;Right knee immobilizer ?Activity Tolerance: Other (comment) (limited by nausea) ?Patient left: in bed;with call bell/phone within reach;Other (comment);with bed alarm set (on CPM) ?Nurse Communication: Mobility status ?PT Visit Diagnosis: Other abnormalities of gait and mobility (R26.89);Pain ?Pain - Right/Left: Right ?Pain - part of body: Knee ?  ? ? ?Time: 7106-2694 ?PT Time Calculation (min) (ACUTE ONLY): 28 min ? ?Charges:  $Gait Training: 8-22 mins ?$Therapeutic Exercise: 8-22 mins          ?          ? ?Cheryl Tate. PTA ?Acute Rehabilitation Services ?Office: [phone] ? ? ? ?Cheryl Tate ?05/24/2021, 9:50 AM ? ?

## 2021-05-25 ENCOUNTER — Inpatient Hospital Stay (HOSPITAL_COMMUNITY): Payer: No Typology Code available for payment source

## 2021-05-25 ENCOUNTER — Other Ambulatory Visit: Payer: Self-pay

## 2021-05-25 DIAGNOSIS — Z96659 Presence of unspecified artificial knee joint: Secondary | ICD-10-CM

## 2021-05-25 DIAGNOSIS — M7989 Other specified soft tissue disorders: Secondary | ICD-10-CM | POA: Diagnosis not present

## 2021-05-25 MED ORDER — GABAPENTIN 300 MG PO CAPS
300.0000 mg | ORAL_CAPSULE | Freq: Two times a day (BID) | ORAL | 0 refills | Status: DC
Start: 2021-05-25 — End: 2021-05-25

## 2021-05-25 MED ORDER — TRAMADOL HCL 50 MG PO TABS
50.0000 mg | ORAL_TABLET | Freq: Four times a day (QID) | ORAL | 0 refills | Status: DC
Start: 2021-05-25 — End: 2021-05-25

## 2021-05-25 MED ORDER — HYDROCODONE-ACETAMINOPHEN 5-325 MG PO TABS: 1.0000 | ORAL_TABLET | Freq: Four times a day (QID) | ORAL | 0 refills | Status: DC | PRN

## 2021-05-25 MED ORDER — ACETAMINOPHEN 500 MG PO TABS: 500.0000 mg | ORAL_TABLET | Freq: Four times a day (QID) | ORAL | 0 refills | Status: DC | PRN

## 2021-05-25 MED ORDER — METHOCARBAMOL 500 MG PO TABS: 500.0000 mg | ORAL_TABLET | Freq: Three times a day (TID) | ORAL | 0 refills | Status: DC | PRN

## 2021-05-25 MED ORDER — METHOCARBAMOL 500 MG PO TABS
500.0000 mg | ORAL_TABLET | Freq: Three times a day (TID) | ORAL | 0 refills | Status: DC | PRN
Start: 2021-05-25 — End: 2021-07-04

## 2021-05-25 MED ORDER — ONDANSETRON HCL 4 MG PO TABS: 4.0000 mg | ORAL_TABLET | Freq: Four times a day (QID) | ORAL | 0 refills | Status: DC | PRN

## 2021-05-25 MED ORDER — GABAPENTIN 300 MG PO CAPS: 300.0000 mg | ORAL_CAPSULE | Freq: Two times a day (BID) | ORAL | 0 refills | Status: DC

## 2021-05-25 NOTE — TOC Initial Note (Signed)
Transition of Care (TOC) - Initial/Assessment Note  ? ? ?Patient Details  ?Name: Cheryl Tate [facility] ?MRN: [id number] ?Date of Birth: 09/10/1970 ? ?Transition of Care [facility]) CM/SW Contact:    ?Sharin Mons, RN ?Phone Number: ?05/25/2021, 3:10 PM ? ?Clinical Narrative:                 ?      -s/p  R TKA ?From home with husband. PTA independent with ADL's, no DME usage. Order noted for home health PT services. Pt agreeable to home health services. ?Referral made with [facility], [facility], [facility], [facility], [facility], all declined 2/2 out of network. ?Referral made with [facility], acceptance pending.... ? ?Pt without DME needs. ? ?TOC following and will assist with needs. ? ?Expected Discharge Plan: [place] ?Barriers to Discharge: Continued Medical Work up ? ? ?Patient Goals and CMS Choice ?  ?  ?Choice offered to / list presented to : Patient ? ?Expected Discharge Plan and Services ?Expected Discharge Plan: [place] ?  ?  ?  ?  ?Expected Discharge Date: 05/25/21               ?  ?  ?  ?  ?  ?HH Arranged: PT ?[place] Agency: [place] ?Date HH Agency Contacted: 05/23/21 ?Time [place]: 7782 ?Representative spoke with at [place]: Tommi Rumps ? ?Prior Living Arrangements/Services ?  ?  ?  ?Do you feel safe going back to the place where you live?: Yes      ?  ?  ?  ?  ? ?Activities of Daily Living ?Home Assistive Devices/Equipment: None ?ADL Screening (condition at time of admission) ?Patient's cognitive ability adequate to safely complete daily activities?: Yes ?Is the patient deaf or have difficulty hearing?: No ?Does the patient have difficulty seeing, even when wearing glasses/contacts?: No ?Does the patient have difficulty concentrating, remembering, or making decisions?: No ?Patient able to express need for assistance with ADLs?: Yes ?Does the patient have difficulty dressing or bathing?: No ?Independently performs ADLs?: Yes  (appropriate for developmental age) ?Does the patient have difficulty walking or climbing stairs?: Yes ?Weakness of Legs: Right ?Weakness of Arms/Hands: None ? ?Permission Sought/Granted ?  ?  ?   ?   ?   ?   ? ?Emotional Assessment ?  ?  ?  ?  ?  ?  ? ?Admission diagnosis:  S/P knee replacement [Z96.659] ?Patient Active Problem List  ? Diagnosis Date Noted  ? 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 BMI 33.0-33.9,adult 06/16/2015  ? Von Willebrand disease ([place]) 06/13/2015  ? Depression 12/29/2014  ? Rhinitis, allergic 12/28/2014  ? ?PCP:  Eunice Blase, MD ?Pharmacy:   ?[id number] - [place], [place] ?[place] ?[place] [address] ?Phone: [phone] Fax: [phone] ? ? ? ? ?Social Determinants of Health (SDOH) Interventions ?  ? ?Readmission Risk Interventions ?   ? View : No data to display.  ?  ?  ?  ? ? ? ?

## 2021-05-25 NOTE — Progress Notes (Signed)
Lower extremity venous study completed.  ? ?Please see CV Proc for preliminary results.  ? ?Darlin Coco, RDMS, RVT ? ? ?

## 2021-05-25 NOTE — Progress Notes (Signed)
Physical Therapy Treatment ?Patient Details ?Name: Cheryl Tate [facility] ?MRN: [id number] ?DOB: 12-01-1970 ?Today's Date: 05/25/2021 ? ? ?History of Present Illness Pt is a 51 y.o. female s/p elective R TKA on 05/22/21. PMH includes depression. ? ?  ?PT Comments  ? ? Pt progressing well towards acute goals, able to come to standing with up to min guard for safety. Assisted RN in donning TED hose at start of session. Pt min guard for ambulation, demonstrating good knee stability, sequencing and no LOB.  Pt able to recall stair negotiation sequencing, safe car entry/exit and HEP compliance with minimal prompts. Educated pt re; increasing activity tolerance slowly and importance of continued mobility, pt verbalized understanding. Answered all pt questions and anticipate safe discharge with family assistance once medically cleared, will follow acutely. Pt continues to benefit from skilled PT services to progress toward functional mobility goals.  ? ?  ?Recommendations for follow up therapy are one component of a multi-disciplinary discharge planning process, led by the attending physician.  Recommendations may be updated based on patient status, additional functional criteria and insurance authorization. ? ?Follow Up Recommendations ? Follow physician's recommendations for discharge plan and follow up therapies ?  ?  ?Assistance Recommended at Discharge Intermittent Supervision/Assistance  ?Patient can return home with the following A little help with bathing/dressing/bathroom;Assistance with cooking/housework;Assist for transportation;Help with stairs or ramp for entrance ?  ?Equipment Recommendations ? None recommended by PT  ?  ?Recommendations for Other Services   ? ? ?  ?Precautions / Restrictions Precautions ?Precautions: Knee;Fall ?Restrictions ?Weight Bearing Restrictions: Yes ?RLE Weight Bearing: Weight bearing as tolerated  ?  ? ?Mobility ? Bed Mobility ?Overal bed mobility: Needs Assistance ?  ?  ?  ?  ?  ?   ?General bed mobility comments: OOB in recliner on arrival ?  ? ?Transfers ?Overall transfer level: Needs assistance ?Equipment used: Rolling walker (2 wheels) ?Transfers: Sit to/from Stand ?Sit to Stand: Supervision, Min guard ?  ?  ?  ?  ?  ?General transfer comment: min guard for safety ?  ? ?Ambulation/Gait ?Ambulation/Gait assistance: Min guard ?Gait Distance (Feet): 138 Feet ?Assistive device: Rolling walker (2 wheels) ?Gait Pattern/deviations: Step-to pattern, Decreased stance time - right, Decreased weight shift to right, Antalgic, Trunk flexed ?Gait velocity: decr ?  ?  ?General Gait Details: slow steady gait on RW, no LOB, good recall of sequencing, light cues for trunk elevation ? ? ?Stairs ?  ?  ?  ?  ?General stair comments: able to recall sequencing ? ? ?Wheelchair Mobility ?  ? ?Modified Rankin (Stroke Patients Only) ?  ? ? ?  ?Balance Overall balance assessment: Needs assistance ?Sitting-balance support: No upper extremity supported, Feet supported ?Sitting balance-Leahy Scale: Good ?  ?  ?Standing balance support: Reliant on assistive device for balance ?Standing balance-Leahy Scale: Fair ?  ?  ?  ?  ?  ?  ?  ?  ?  ?  ?  ?  ?  ? ?  ?Cognition Arousal/Alertness: Awake/alert ?Behavior During Therapy: Flat affect, Anxious ?Overall Cognitive Status: Within Functional Limits for tasks assessed ?  ?  ?  ?  ?  ?  ?  ?  ?  ?  ?  ?  ?  ?  ?  ?  ?General Comments: anxious with mobility ?  ?  ? ?  ?Exercises   ? ?  ?General Comments   ?  ?  ? ?Pertinent Vitals/Pain Pain Assessment ?Pain Assessment: 0-10 ?Pain Score:  5  ?Pain Location: R knee ?Pain Descriptors / Indicators: Discomfort, Grimacing, Guarding ?Pain Intervention(s): Limited activity within patient's tolerance, Monitored during session, Repositioned, Ice applied  ? ? ?Home Living   ?  ?  ?  ?  ?  ?  ?  ?  ?  ?   ?  ?Prior Function    ?  ?  ?   ? ?PT Goals (current goals can now be found in the care plan section) Acute Rehab PT Goals ?Patient Stated  Goal: "When can I get back to yoga?" ?PT Goal Formulation: With patient ?Time For Goal Achievement: 06/05/21 ? ?  ?Frequency ? ? ? 7X/week ? ? ? ?  ?PT Plan    ? ? ?Co-evaluation   ?  ?  ?  ?  ? ?  ?AM-PAC PT "6 Clicks" Mobility   ?Outcome Measure ? Help needed turning from your back to your side while in a flat bed without using bedrails?: A Little ?Help needed moving from lying on your back to sitting on the side of a flat bed without using bedrails?: A Little ?Help needed moving to and from a bed to a chair (including a wheelchair)?: A Little ?Help needed standing up from a chair using your arms (e.g., wheelchair or bedside chair)?: A Little ?Help needed to walk in hospital room?: A Little ?Help needed climbing 3-5 steps with a railing? : A Lot ?6 Click Score: 17 ? ?  ?End of Session Equipment Utilized During Treatment: Gait belt ?Activity Tolerance: Patient tolerated treatment well ?Patient left: in chair;with call bell/phone within reach ?Nurse Communication: Mobility status ?PT Visit Diagnosis: Other abnormalities of gait and mobility (R26.89);Pain ?Pain - Right/Left: Right ?Pain - part of body: Knee ?  ? ? ?Time: 6256-3893 ?PT Time Calculation (min) (ACUTE ONLY): 29 min ? ?Charges:  $Gait Training: 23-37 mins          ?          ? ?Audry Riles. PTA ?Acute Rehabilitation Services ?Office: [phone] ? ? ?Cheryl Tate ?05/25/2021, 9:54 AM ? ?

## 2021-05-25 NOTE — Progress Notes (Signed)
Patient called out iceman is leaking, a small hole was found at the bottom of the cooling pad. [facility] and ortho was called for a replacement, there is none available. Ice packs were placed instead. Patient voices no other concerns at this time.  ?

## 2021-05-25 NOTE — Plan of Care (Signed)

## 2021-05-25 NOTE — Progress Notes (Signed)
?  Subjective: ?Patient doing better today on the pain medicine.  Is having a little bit of a headache but that is preferable over the nausea she was having with her narcotics.  Is moving better with therapy.  Spent time in CPM machine yesterday  ? ?Objective: ?Vital signs in last 24 hours: ?Temp:  [98.4 ?F (36.9 ?C)-98.8 ?F (37.1 ?C)] 98.5 ?F (36.9 ?C) (05/05 0086) ?Pulse Rate:  [71-90] 84 (05/05 0812) ?Resp:  [17-18] 17 (05/05 7619) ?BP: (106-123)/(68-80) 106/68 (05/05 5093) ?SpO2:  [98 %-99 %] 99 % (05/05 0812) ? ?Intake/Output from previous day: ?05/04 0701 - 05/05 0700 ?In: -  ?Out: 1300 [Urine:1300] ?Intake/Output this shift: ?No intake/output data recorded. ? ?Exam: ? ?Intact pulses distally ?No cellulitis present ?Compartment soft ?No calf tenderness.  Swelling has diminished compared to yesterday ?Labs: ?Recent Labs  ?  05/23/21 ?2671 05/24/21 ?0211  ?HGB 9.3* 10.1*  ? ?Recent Labs  ?  05/23/21 ?2458 05/24/21 ?0211  ?WBC 10.7* 9.0  ?RBC 2.99* 3.30*  ?HCT 26.8* 28.5*  ?PLT 233 265  ? ?No results for input(s): NA, K, CL, CO2, BUN, CREATININE, GLUCOSE, CALCIUM in the last 72 hours. ?No results for input(s): LABPT, INR in the last 72 hours. ? ?Assessment/Plan: ?Plan at this time is discharged home.  We will check ultrasound to rule out DVT bilateral lower extremities today.  Xarelto is the recommended DVT prophylaxis agent for her but with her von Willebrand's I think it is possible that she would have bleeding complications.  Discussed with her at length the risk and benefits of some DVT prophylaxis versus no DVT prophylaxis.  I think if she is active and goes home with compression stockings then with no blood clot at this time 3 days postop it is unlikely she would develop 1.  Anticipate discharge home today after therapy possibly this afternoon as well as after the ultrasound to rule out DVT. ? ? ?Anderson Malta ?05/25/2021, 8:22 AM  ? ? ?  ?

## 2021-05-27 ENCOUNTER — Other Ambulatory Visit: Payer: Self-pay | Admitting: Specialist

## 2021-05-27 MED ORDER — OXYCODONE HCL 5 MG PO CAPS: 5.0000 mg | ORAL_CAPSULE | ORAL | 0 refills | Status: DC | PRN

## 2021-05-28 ENCOUNTER — Other Ambulatory Visit: Payer: Self-pay

## 2021-05-28 ENCOUNTER — Encounter: Payer: Self-pay | Admitting: Physical Therapy

## 2021-05-28 ENCOUNTER — Ambulatory Visit (INDEPENDENT_AMBULATORY_CARE_PROVIDER_SITE_OTHER): Payer: No Typology Code available for payment source | Admitting: Physical Therapy

## 2021-05-28 DIAGNOSIS — M25561 Pain in right knee: Secondary | ICD-10-CM | POA: Diagnosis not present

## 2021-05-28 DIAGNOSIS — R2689 Other abnormalities of gait and mobility: Secondary | ICD-10-CM

## 2021-05-28 DIAGNOSIS — R2681 Unsteadiness on feet: Secondary | ICD-10-CM

## 2021-05-28 DIAGNOSIS — M25661 Stiffness of right knee, not elsewhere classified: Secondary | ICD-10-CM | POA: Diagnosis not present

## 2021-05-28 DIAGNOSIS — R600 Localized edema: Secondary | ICD-10-CM | POA: Diagnosis not present

## 2021-05-28 DIAGNOSIS — M6281 Muscle weakness (generalized): Secondary | ICD-10-CM

## 2021-05-28 NOTE — Therapy (Signed)
?OUTPATIENT PHYSICAL THERAPY LOWER EXTREMITY EVALUATION ? ? ?Patient Name: Cheryl Tate [facility] ?MRN: [id number] ?DOB:1970/10/11, 51 y.o., female ?Today's Date: 05/28/2021 ? ? PT End of Session - 05/28/21 1633   ? ? Visit Number 1   ? Number of Visits 20   ? Date for PT Re-Evaluation 07/20/21   ? Authorization Type UHC   ? Authorization Time Period [facility] CHOICE  NO VISIT LIMIT, PATIENT PAYS 100% UNTIL DED IS MET, DED $7500 [id number] MET, TERM DATE 09/19/2021   ? Progress Note Due on Visit 10   ? PT Start Time 1515   ? PT Stop Time 6270   ? PT Time Calculation (min) 53 min   ? ?  ?  ? ?  ? ? ?Past Medical History:  ?Diagnosis Date  ? Anemia   ? Depression   ? Menorrhagia   ? Mood disorder ([place])   ? Von Willebrand's disease [facility])   ? followed by hemotologist Dr. Marin Olp  ? ?Past Surgical History:  ?Procedure Laterality Date  ? APPENDECTOMY  01/22/2004  ? DILATION AND CURETTAGE OF UTERUS  2002  ? x 2 in 2002  ? DILITATION & CURRETTAGE/HYSTROSCOPY WITH HYDROTHERMAL ABLATION N/A 11/22/2014  ? Procedure: HYSTEROSCOPY WITH HYDROTHERMAL ABLATION;  Surgeon: Dian Queen, MD;  Location: [facility];  Service: Gynecology;  Laterality: N/A;  ? HYSTEROSCOPY WITH D & C  08-08-2014  in [place]  ? MULTIPLE TOOTH EXTRACTIONS    ? TOTAL KNEE ARTHROPLASTY Right 05/22/2021  ? Procedure: RIGHT TOTAL KNEE ARTHROPLASTY;  Surgeon: Meredith Pel, MD;  Location: [place];  Service: Orthopedics;  Laterality: Right;  ? ?Patient Active Problem List  ? Diagnosis Date Noted  ? 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 BMI 33.0-33.9,adult 06/16/2015  ? Von Willebrand disease ([place]) 06/13/2015  ? Depression 12/29/2014  ? Rhinitis, allergic 12/28/2014  ? ? ?PCP: Eunice Blase, MD ? ?REFERRING PROVIDER: Donella Stade, PA-C ? ?REFERRING DIAG: 6.659 (ICD-10-CM) - History of total knee arthroplasty, unspecified laterality ? ?THERAPY DIAG:  ?Stiffness of right knee, not elsewhere classified ? ?Acute pain of right knee ? ?Muscle weakness  (generalized) ? ?Localized edema ? ?Other abnormalities of gait and mobility ? ?Unsteadiness on feet ? ?ONSET DATE: 05/22/2021 Right TKA  ? ?SUBJECTIVE:  ? ?SUBJECTIVE STATEMENT: ?She underwent a right Total Knee Replacement 05/22/2021.  Six months ago she had 2 injuries involving her right knee.   ? ?PERTINENT HISTORY: ?Von Willebrand disease, depression, Menorrhgia ? ?PAIN:  ?Are you having pain? Yes: NPRS scale: 4/10 since surgery lowest 2/10 & up to 10/10 ?Pain location: right knee all over.  Back of knee "kills" when laying elevated.  ?Pain description: sharp, sore, cramps ?Aggravating factors: laying with elevated, bending. ?Relieving factors: ice, meds ? ?PRECAUTIONS: None ? ?WEIGHT BEARING RESTRICTIONS No ? ?FALLS:  ?Has patient fallen in last 6 months? Yes. Number of falls 1 time since surgery, tripped over rug. No injuires.  ? ?LIVING ENVIRONMENT: ?Lives with: lives with their spouse and lives with their son 3 dogs (70#, 80# & 40#) 1 cat indoor, 1 cat indoor/outdoor & 1 outdoor.  ?Lives in: House/apartment ?Stairs: Yes: Internal: split level  house with 3 steps 2 rails, 3 steps; on right going up and External: 3 steps; bilateral but cannot reach both ?Has following equipment at home:  4 footed walker ? 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 ?PLOF: Independent, Independent with household mobility without device, and Independent with  community mobility without device ? ?PATIENT GOALS    to get back to normal, Yoga, walk the dogs,  ? ? ?OBJECTIVE:  ? ?PATIENT SURVEYS:  ?FOTO 29% functional & target 67% ? ?COGNITION: ?Overall cognitive status: Within functional limits for tasks assessed   ? ?EDEMA: ?RLE above knee 51.8cm around knee 48cm below knee  41.2cm ?LLE above knee 45.8cm around knee 40cm below knee 34.1cm ? ?PALPATION: ?Bruising along medial knee & lower leg, tenderness over joint line, quads, hamstrings ? ?LE ROM: ? ?ROM ?P: Passive  A: Active Right ?05/28/2021  Left ?05/28/2021  ?Hip flexion    ?Hip extension    ?Hip abduction    ?Hip adduction    ?Hip internal rotation    ?Hip external rotation    ?Knee flexion Supine ?P: 64* ?Seated  ?A: 64*   ?Knee extension Supine ?P: -10* ?Seated  ?A: -31*   ?Ankle dorsiflexion    ?Ankle plantarflexion    ?Ankle inversion    ?Ankle eversion    ? (Blank rows = not tested) ? ?LE MMT: ? ?MMT Right ?05/28/2021 Left ?05/28/2021  ?Hip flexion    ?Hip extension    ?Hip abduction    ?Hip adduction    ?Hip internal rotation    ?Hip external rotation    ?Knee flexion 2/5   ?Knee extension 2/5   ?Ankle dorsiflexion    ?Ankle plantarflexion    ?Ankle inversion    ?Ankle eversion    ? (Blank rows = not tested) ? ?GAIT: ?Distance walked: 5' ?Assistive device utilized:  4 footed walker ?level of assistance: SBA  verbal cues needed ?Comments: antalgic, right knee flexed in stance, limited to no increase flexion for swing, RLE abducted,  ? ?Functional Activities: Pt using LLE to move RLE in & out of bed.  PT min guard RLE and she was able to move RLE without assisting with LLE.  ? ?TODAY'S TREATMENT: ?1 set of HEP for comprehension performed.  ? ? ?PATIENT EDUCATION:  ?Education details: Access Code: [email or web address] ?Person educated: Patient and Spouse ?Education method: Explanation, Demonstration, Tactile cues, Verbal cues, and Handouts ?Education comprehension: verbalized understanding, returned demonstration, verbal cues required, tactile cues required, and needs further education ? ? ?HOME EXERCISE PROGRAM: ?Access Code: [email or web address] ?URL: [email or web address] ?Date: 05/28/2021 ?Prepared by: Jamey Reas ? ?Exercises ?- Quad Setting and Stretching  - 2-4 x daily - 7 x weekly - 5-10 sets - 10 reps - prop 5-10 minutes & quad set5 seconds hold ?- Ankle Alphabet in Elevation  - 2-4 x daily - 7 x weekly - 1 sets - 1 reps ?- Supine Heel Slide with Strap  - 2-3 x daily - 7 x weekly - 2-3 sets - 10 reps - 5 seconds hold ?- Supine Heel Slide with Small  Ball  - 2-3 x daily - 7 x weekly - 2-3 sets - 10 reps - 5 seconds hold ?- Supine Straight Leg Raises  - 2-3 x daily - 7 x weekly - 2-3 sets - 10 reps - 5 seconds hold ?- Seated Knee Flexion Extension AROM   - 2-4 x daily - 7 x weekly - 2-3 sets - 10 reps - 5 seconds hold ?- Seated Knee Flexion AAROM  - 1 x daily - 5 x weekly - 1 sets - 10 reps - 5 seconds hold ?- Seated straight leg lifts  - 2-3 x daily - 7 x weekly - 2-3 sets - 10 reps - 5 seconds hold ?- Seated Hamstring Stretch  with Strap  - 2-4 x daily - 7 x weekly - 1 sets - 3 reps - 20-30 seconds hold ? ?ASSESSMENT: ? ?CLINICAL IMPRESSION: ?Patient is a 51 y.o. female who was seen today for physical therapy evaluation and treatment for right TKR. She demonstrates decreased strength, ROM, increased edema and pain with gait abnormalities affecting functional mobility. ? ?OBJECTIVE IMPAIRMENTS Abnormal gait, decreased activity tolerance, decreased balance, decreased knowledge of condition, decreased knowledge of use of DME, decreased mobility, difficulty walking, decreased ROM, decreased strength, increased edema, increased muscle spasms, impaired flexibility, postural dysfunction, and pain.  ? ?ACTIVITY LIMITATIONS community activity, driving, and personal recreation like Yoga & walking dogs .  ? ?PERSONAL FACTORS 1-2 comorbidities: see PMH  are also affecting patient's functional outcome.  ? ?REHAB POTENTIAL: Good ? ?CLINICAL DECISION MAKING: Stable/uncomplicated ? ?EVALUATION COMPLEXITY: Low ? ? ?GOALS: ?Goals reviewed with patient? Yes ? ?SHORT TERM GOALS: Target date: 06/22/2021 ? ?Patient independent and verbalizes compliance with initial HEP ?Baseline: SEE OBJECTIVE DATA ?Goal status: INITIAL ? ?2.  Patient reports 50% improvement in right knee pain. ?Baseline: SEE OBJECTIVE DATA ?Goal status: INITIAL ? ?3.  PROM right knee extension -3* to flexion 90* ?Baseline: SEE OBJECTIVE DATA ?Goal status: INITIAL ? ?LONG TERM GOALS: Target date: 07/20/2021 ? ?Patient  will improve FOTO score to 67% ?Baseline: SEE OBJECTIVE DATA ?Goal status: INITIAL ? ?2.  Patient reports right knee pain </= 2/10 with standing & gait activities.  ?Baseline: SEE OBJECTIVE DATA ?Goal status: INITIAL ? ?

## 2021-05-29 ENCOUNTER — Encounter: Payer: Self-pay | Admitting: Rehabilitative and Restorative Service Providers"

## 2021-05-29 ENCOUNTER — Other Ambulatory Visit: Payer: Self-pay | Admitting: Specialist

## 2021-05-29 ENCOUNTER — Ambulatory Visit (INDEPENDENT_AMBULATORY_CARE_PROVIDER_SITE_OTHER): Payer: No Typology Code available for payment source | Admitting: Rehabilitative and Restorative Service Providers"

## 2021-05-29 DIAGNOSIS — M25561 Pain in right knee: Secondary | ICD-10-CM

## 2021-05-29 DIAGNOSIS — R2681 Unsteadiness on feet: Secondary | ICD-10-CM

## 2021-05-29 DIAGNOSIS — M6281 Muscle weakness (generalized): Secondary | ICD-10-CM | POA: Diagnosis not present

## 2021-05-29 DIAGNOSIS — M25661 Stiffness of right knee, not elsewhere classified: Secondary | ICD-10-CM | POA: Diagnosis not present

## 2021-05-29 DIAGNOSIS — R2689 Other abnormalities of gait and mobility: Secondary | ICD-10-CM

## 2021-05-29 DIAGNOSIS — R600 Localized edema: Secondary | ICD-10-CM | POA: Diagnosis not present

## 2021-05-29 NOTE — Therapy (Signed)
?OUTPATIENT PHYSICAL THERAPY TREATMENT NOTE ? ? ?Patient Name: Cheryl Tate [facility] ?MRN: [id number] ?DOB:March 02, 1970, 51 y.o., female ?Today's Date: 05/29/2021 ? ? ?END OF SESSION:  ? PT End of Session - 05/29/21 1232   ? ? Visit Number 2   ? Number of Visits 20   ? Date for PT Re-Evaluation 07/20/21   ? Authorization Type UHC   ? Authorization Time Period [facility] CHOICE  NO VISIT LIMIT, PATIENT PAYS 100% UNTIL DED IS MET, DED $7500 [id number] MET, TERM DATE 09/19/2021   ? Progress Note Due on Visit 10   ? PT Start Time 1143   ? PT Stop Time 1235   ? PT Time Calculation (min) 52 min   ? Activity Tolerance Patient limited by pain   ? Behavior During Therapy [facility] for tasks assessed/performed   ? ?  ?  ? ?  ? ? ?Past Medical History:  ?Diagnosis Date  ? Anemia   ? Depression   ? Menorrhagia   ? Mood disorder ([place])   ? Von Willebrand's disease [facility])   ? followed by hemotologist Dr. Marin Olp  ? ?Past Surgical History:  ?Procedure Laterality Date  ? APPENDECTOMY  01/22/2004  ? DILATION AND CURETTAGE OF UTERUS  2002  ? x 2 in 2002  ? DILITATION & CURRETTAGE/HYSTROSCOPY WITH HYDROTHERMAL ABLATION N/A 11/22/2014  ? Procedure: HYSTEROSCOPY WITH HYDROTHERMAL ABLATION;  Surgeon: Dian Queen, MD;  Location: [facility];  Service: Gynecology;  Laterality: N/A;  ? HYSTEROSCOPY WITH D & C  08-08-2014  in [place]  ? MULTIPLE TOOTH EXTRACTIONS    ? TOTAL KNEE ARTHROPLASTY Right 05/22/2021  ? Procedure: RIGHT TOTAL KNEE ARTHROPLASTY;  Surgeon: Meredith Pel, MD;  Location: [place];  Service: Orthopedics;  Laterality: Right;  ? ?Patient Active Problem List  ? Diagnosis Date Noted  ? 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 BMI 33.0-33.9,adult 06/16/2015  ? Von Willebrand disease ([place]) 06/13/2015  ? Depression 12/29/2014  ? Rhinitis, allergic 12/28/2014  ? ? ?PCP: Eunice Blase, MD ?  ?REFERRING PROVIDER: Donella Stade, PA-C ?  ?REFERRING DIAG: 6.659 (ICD-10-CM) - History of total knee arthroplasty, unspecified laterality ? ?ONSET  DATE: 05/22/2021 Right TKA  ? ?THERAPY DIAG:  ?Stiffness of right knee, not elsewhere classified ? ?Acute pain of right knee ? ?Muscle weakness (generalized) ? ?Localized edema ? ?Other abnormalities of gait and mobility ? ?Unsteadiness on feet ? ?PERTINENT HISTORY: Von Willebrand disease, depression, Menorrhgia ? ?PRECAUTIONS: None ? ?SUBJECTIVE: Pt indicated she felt good improvement c use of HEP since yesterday.  ? ?PAIN:  ?NPRS scale: 2-3/10 ?Pain location: right knee all over.  Back of knee "kills" when laying elevated.  ?Pain description: sharp, sore, cramps ?Aggravating factors: laying with elevated, bending. ?Relieving factors: ice, meds ? ? ?OBJECTIVE:  ?  ?PATIENT SURVEYS:  ?05/28/2021 FOTO 29% functional & target 67% ?  ?COGNITION: ?05/28/2021 Overall cognitive status: Within functional limits for tasks assessed              ?  ?EDEMA: ?05/28/2021 ?RLE above knee 51.8cm around knee 48cm below knee  41.2cm ?LLE above knee 45.8cm around knee 40cm below knee 34.1cm ?  ?PALPATION: ?05/28/2021 Bruising along medial knee & lower leg, tenderness over joint line, quads, hamstrings ?  ?LE ROM: ?  ?ROM ?P: Passive  A: Active Right ?05/28/2021 Left ?05/28/2021  ?Hip flexion      ?Hip extension      ?Hip abduction      ?Hip adduction      ?  Hip internal rotation      ?Hip external rotation      ?Knee flexion Supine ?P: 64* ?Seated  ?A: 64*    ?Knee extension Supine ?P: -10* ?Seated  ?A: -31*    ?Ankle dorsiflexion      ?Ankle plantarflexion      ?Ankle inversion      ?Ankle eversion      ? (Blank rows = not tested) ?  ?LE MMT: ?  ?MMT Right ?05/28/2021 Left ?05/28/2021  ?Hip flexion      ?Hip extension      ?Hip abduction      ?Hip adduction      ?Hip internal rotation      ?Hip external rotation      ?Knee flexion 2/5    ?Knee extension 2/5    ?Ankle dorsiflexion      ?Ankle plantarflexion      ?Ankle inversion      ?Ankle eversion      ? (Blank rows = not tested) ?  ?GAIT: ?05/28/2021 ?Distance walked: 59' ?Assistive device utilized:   4 footed walker ?level of assistance: SBA  verbal cues needed ?Comments: antalgic, right knee flexed in stance, limited to no increase flexion for swing, RLE abducted,  ?  ?Functional Activities: Pt using LLE to move RLE in & out of bed.  PT min guard RLE and she was able to move RLE without assisting with LLE.  ?  ?TODAY'S TREATMENT: ?05/29/2021 ? Therex:  ?  Nustep Lvl 5 UE/LE 8 mins ?  Seated LAQ c contralateral movement opposite, pause in end range flexion /extension 2 x 10 ?  Seated alternating isometrics Rt knee extension/flexion 5 sec each way 2 mins ?  Standing church pew ankle strategy weight shifting 2 mins in FWW c SBA ?  Review of existing HEP c cues for techniques.  Cues given to encourage routine use throughout the day.  ? ? Manual: ?  Seated guided PROM c mild distraction, IR into flexion Rt knee.  Noted guarding from muscle to movement due to pain.    ? ? Vasopneumatic: ?  Supine Rt knee 34 degrees, medium compression in elevation 10 mins ?   ? ? ?05/28/2021 ?1 set of HEP for comprehension performed.  ?  ?  ?PATIENT EDUCATION:  ?Education details: Access Code: [email or web address] ?Person educated: Patient and Spouse ?Education method: Explanation, Demonstration, Tactile cues, Verbal cues, and Handouts ?Education comprehension: verbalized understanding, returned demonstration, verbal cues required, tactile cues required, and needs further education ?  ?  ?HOME EXERCISE PROGRAM: ?Access Code: [email or web address] ?URL: [email or web address] ?Date: 05/28/2021 ?Prepared by: Jamey Reas ?  ?Exercises ?- Quad Setting and Stretching  - 2-4 x daily - 7 x weekly - 5-10 sets - 10 reps - prop 5-10 minutes & quad set5 seconds hold ?- Ankle Alphabet in Elevation  - 2-4 x daily - 7 x weekly - 1 sets - 1 reps ?- Supine Heel Slide with Strap  - 2-3 x daily - 7 x weekly - 2-3 sets - 10 reps - 5 seconds hold ?- Supine Heel Slide with Small Ball  - 2-3 x daily - 7 x weekly - 2-3 sets - 10 reps - 5 seconds hold ?- Supine Straight  Leg Raises  - 2-3 x daily - 7 x weekly - 2-3 sets - 10 reps - 5 seconds hold ?- Seated Knee Flexion Extension AROM   - 2-4 x daily - 7 x weekly - 2-3 sets - 10  reps - 5 seconds hold ?- Seated Knee Flexion AAROM  - 1 x daily - 5 x weekly - 1 sets - 10 reps - 5 seconds hold ?- Seated straight leg lifts  - 2-3 x daily - 7 x weekly - 2-3 sets - 10 reps - 5 seconds hold ?- Seated Hamstring Stretch with Strap  - 2-4 x daily - 7 x weekly - 1 sets - 3 reps - 20-30 seconds hold ?  ?ASSESSMENT: ?  ?CLINICAL IMPRESSION: ?Noted guarding to both active and passive movements within clinic today.   Pt to benefit from continued skilled PT services to improve mobility and muscle activation.  ?  ?OBJECTIVE IMPAIRMENTS Abnormal gait, decreased activity tolerance, decreased balance, decreased knowledge of condition, decreased knowledge of use of DME, decreased mobility, difficulty walking, decreased ROM, decreased strength, increased edema, increased muscle spasms, impaired flexibility, postural dysfunction, and pain.  ?  ?ACTIVITY LIMITATIONS community activity, driving, and personal recreation like Yoga & walking dogs .  ?  ?PERSONAL FACTORS 1-2 comorbidities: see PMH  are also affecting patient's functional outcome.  ?  ?REHAB POTENTIAL: Good ?  ?CLINICAL DECISION MAKING: Stable/uncomplicated ?  ?EVALUATION COMPLEXITY: Low ?  ?  ?GOALS: ?Goals reviewed with patient? Yes ?  ?SHORT TERM GOALS: Target date: 06/22/2021 ?  ?Patient independent and verbalizes compliance with initial HEP ?Baseline: SEE OBJECTIVE DATA ?Goal status: on going 05/29/2021 ?  ?2.  Patient reports 50% improvement in right knee pain. ?Baseline: SEE OBJECTIVE DATA ?Goal status: on going 05/29/2021 ?  ?3.  PROM right knee extension -3* to flexion 90* ?Baseline: SEE OBJECTIVE DATA ?Goal status: on going 05/29/2021 ?  ?LONG TERM GOALS: Target date: 07/20/2021 ?  ?Patient will improve FOTO score to 67% ?Baseline: SEE OBJECTIVE DATA ?Goal status: INITIAL ?  ?2.  Patient reports  right knee pain </= 2/10 with standing & gait activities.  ?Baseline: SEE OBJECTIVE DATA ?Goal status: INITIAL ?  ?3.  Right Knee PROM 0* extension to 110* flexion ?Baseline: SEE OBJECTIVE DATA ?Goal s

## 2021-05-30 ENCOUNTER — Encounter: Payer: Self-pay | Admitting: Orthopedic Surgery

## 2021-05-30 ENCOUNTER — Ambulatory Visit (INDEPENDENT_AMBULATORY_CARE_PROVIDER_SITE_OTHER): Payer: No Typology Code available for payment source | Admitting: Surgical

## 2021-05-30 ENCOUNTER — Other Ambulatory Visit: Payer: Self-pay | Admitting: Specialist

## 2021-05-30 ENCOUNTER — Ambulatory Visit (INDEPENDENT_AMBULATORY_CARE_PROVIDER_SITE_OTHER): Payer: No Typology Code available for payment source

## 2021-05-30 DIAGNOSIS — Z96659 Presence of unspecified artificial knee joint: Secondary | ICD-10-CM

## 2021-05-30 DIAGNOSIS — Z96651 Presence of right artificial knee joint: Secondary | ICD-10-CM

## 2021-05-30 MED ORDER — OXYCODONE HCL 5 MG PO CAPS: 5.0000 mg | ORAL_CAPSULE | ORAL | 0 refills | Status: DC | PRN

## 2021-05-30 NOTE — Progress Notes (Signed)
? ?Post-Op Visit Note ?  ?Patient: Cheryl Tate           ?Date of Birth: [birth year]           ?MRN: [id number] ?Visit Date: 05/30/2021 ?PCP: Eunice Blase, MD ? ? ?Assessment & Plan: ? ?Chief Complaint:  ?Chief Complaint  ?Patient presents with  ? Right Knee - Pain  ?  Fall today s/p TKA  ? ?Visit Diagnoses:  ?1. History of total knee arthroplasty, unspecified laterality   ? ? ?Plan: Cheryl Tate is a 51 y.o. female who presents s/p right total knee arthroplasty on 05/22/2021.  Doing well overall.  Using CPM machine up to 47 degrees.  They deny any calf pain, shortness of breath, chest pain, abdominal pain.  Pain is overall controlled. Taking nothing for DVT prophylaxis due to her history of von Willebrand's.  She is using TED hose for DVT prophylaxis..  Ambulating with walker.  She had a fall earlier today where she fell directly onto her operative knee.  Mildly increased pain since the fall but she is very anxious about injury to the prosthesis. ? ?On exam, patient has range of motion 5 degrees to 45 degrees.  Incision is healing well without evidence of infection or dehiscence; Aquacel dressing was removed to view the incision and then a new Aquacel dressing was placed.  2+ DP pulse of the operative extremity.  No calf tenderness, negative Homans' sign.  Able to perform straight leg raise with great exertion.  Intact ankle dorsiflexion. ? ?Plan is continue with outpatient physical therapy.  She is okay to go back to therapy tomorrow.  Follow-up with Dr. Marlou Sa in clinic in 1 week.  Radiographs taken today demonstrate no evidence of periprosthetic fracture or any loosening of the implant.  Really needs to work on flexion but the incision looks great today. ? ?Follow-Up Instructions: No follow-ups on file.  ? ?Orders:  ?Orders Placed This Encounter  ?Procedures  ? XR KNEE 3 VIEW RIGHT  ? ?No orders of the defined types were placed in this encounter. ? ? ?Imaging: ?No results found. ? ?PMFS  History: ?Patient Active Problem List  ? Diagnosis Date Noted  ? 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 BMI 33.0-33.9,adult 06/16/2015  ? Von Willebrand disease ([place]) 06/13/2015  ? Depression 12/29/2014  ? Rhinitis, allergic 12/28/2014  ? ?Past Medical History:  ?Diagnosis Date  ? Anemia   ? Depression   ? Menorrhagia   ? Mood disorder ([place])   ? Von Willebrand's disease [facility])   ? followed by hemotologist Dr. Marin Olp  ?  ?Family History  ?Problem Relation Age of Onset  ? Mental retardation Mother   ? Mental retardation Brother   ? Mental retardation Maternal Grandmother   ?  ?Past Surgical History:  ?Procedure Laterality Date  ? APPENDECTOMY  01/22/2004  ? DILATION AND CURETTAGE OF UTERUS  2002  ? x 2 in 2002  ? DILITATION & CURRETTAGE/HYSTROSCOPY WITH HYDROTHERMAL ABLATION N/A 11/22/2014  ? Procedure: HYSTEROSCOPY WITH HYDROTHERMAL ABLATION;  Surgeon: Dian Queen, MD;  Location: [facility];  Service: Gynecology;  Laterality: N/A;  ? HYSTEROSCOPY WITH D & C  08-08-2014  in [place]  ? MULTIPLE TOOTH EXTRACTIONS    ? TOTAL KNEE ARTHROPLASTY Right 05/22/2021  ? Procedure: RIGHT TOTAL KNEE ARTHROPLASTY;  Surgeon: Meredith Pel, MD;  Location: [place];  Service: Orthopedics;  Laterality: Right;  ? ?Social History  ? ?Occupational History  ? Occupation: Biomedical scientist, Tree surgeon  ?  Tobacco Use  ? Smoking status: Every Day  ?  Packs/day: 0.20  ?  Years: 25.00  ?  Pack years: 5.00  ?  Types: Cigarettes  ? Smokeless tobacco: Never  ? Tobacco comments:  ?  2 cig daily  ?Vaping Use  ? Vaping Use: Never used  ?Substance and Sexual Activity  ? Alcohol use: Yes  ?  Alcohol/week: 7.0 standard drinks  ?  Types: 7 Glasses of wine per week  ?  Comment: one wine daily  ? Drug use: Yes  ?  Types: Marijuana  ?  Comment: helps with sleep nightly  ? Sexual activity: Yes  ? ? ? ?

## 2021-05-31 ENCOUNTER — Encounter: Payer: Self-pay | Admitting: Rehabilitative and Restorative Service Providers"

## 2021-05-31 ENCOUNTER — Telehealth: Payer: Self-pay | Admitting: Surgical

## 2021-05-31 ENCOUNTER — Other Ambulatory Visit: Payer: Self-pay | Admitting: Surgical

## 2021-05-31 ENCOUNTER — Ambulatory Visit (INDEPENDENT_AMBULATORY_CARE_PROVIDER_SITE_OTHER): Payer: No Typology Code available for payment source | Admitting: Rehabilitative and Restorative Service Providers"

## 2021-05-31 DIAGNOSIS — R2689 Other abnormalities of gait and mobility: Secondary | ICD-10-CM

## 2021-05-31 DIAGNOSIS — M6281 Muscle weakness (generalized): Secondary | ICD-10-CM | POA: Diagnosis not present

## 2021-05-31 DIAGNOSIS — M25561 Pain in right knee: Secondary | ICD-10-CM

## 2021-05-31 DIAGNOSIS — M25661 Stiffness of right knee, not elsewhere classified: Secondary | ICD-10-CM

## 2021-05-31 DIAGNOSIS — R2681 Unsteadiness on feet: Secondary | ICD-10-CM

## 2021-05-31 DIAGNOSIS — R600 Localized edema: Secondary | ICD-10-CM | POA: Diagnosis not present

## 2021-05-31 MED ORDER — OXYCODONE HCL 5 MG PO TABS: 5.0000 mg | ORAL_TABLET | ORAL | 0 refills | Status: DC | PRN

## 2021-05-31 NOTE — Telephone Encounter (Signed)
Sent in refill for q5h prn

## 2021-05-31 NOTE — Telephone Encounter (Signed)
Pt is calling back to ask about the pharmacist ? ?Wants to take the medication every 5 hours not 4 hours -- Makes her feel like she has drunk a 100 cups of coffee  ?

## 2021-05-31 NOTE — Telephone Encounter (Signed)
There is no capsules in stock --only tablets. ? ?Please have Lurena Joiner or Dr Marlou Sa call with approval to change  or send a new script  ?

## 2021-05-31 NOTE — Telephone Encounter (Signed)
Could you please change rx? ?

## 2021-05-31 NOTE — Therapy (Signed)
?OUTPATIENT PHYSICAL THERAPY TREATMENT NOTE ? ? ?Patient Name: Cheryl Tate [facility] ?MRN: [id number] ?DOB:10/18/1970, 51 y.o., female ?Today's Date: 05/31/2021 ? ? ?END OF SESSION:  ? PT End of Session - 05/31/21 1145   ? ? Visit Number 3   ? Number of Visits 20   ? Date for PT Re-Evaluation 07/20/21   ? Authorization Type UHC   ? Authorization Time Period [facility] CHOICE  NO VISIT LIMIT, PATIENT PAYS 100% UNTIL DED IS MET, DED $7500 [id number] MET, TERM DATE 09/19/2021   ? Progress Note Due on Visit 10   ? PT Start Time 1138   ? PT Stop Time 1230   ? PT Time Calculation (min) 52 min   ? Activity Tolerance Patient limited by pain   ? Behavior During Therapy [facility] for tasks assessed/performed   ? ?  ?  ? ?  ? ? ? ?Past Medical History:  ?Diagnosis Date  ? Anemia   ? Depression   ? Menorrhagia   ? Mood disorder ([place])   ? Von Willebrand's disease [facility])   ? followed by hemotologist Dr. Marin Olp  ? ?Past Surgical History:  ?Procedure Laterality Date  ? APPENDECTOMY  01/22/2004  ? DILATION AND CURETTAGE OF UTERUS  2002  ? x 2 in 2002  ? DILITATION & CURRETTAGE/HYSTROSCOPY WITH HYDROTHERMAL ABLATION N/A 11/22/2014  ? Procedure: HYSTEROSCOPY WITH HYDROTHERMAL ABLATION;  Surgeon: Dian Queen, MD;  Location: [facility];  Service: Gynecology;  Laterality: N/A;  ? HYSTEROSCOPY WITH D & C  08-08-2014  in [place]  ? MULTIPLE TOOTH EXTRACTIONS    ? TOTAL KNEE ARTHROPLASTY Right 05/22/2021  ? Procedure: RIGHT TOTAL KNEE ARTHROPLASTY;  Surgeon: Meredith Pel, MD;  Location: [place];  Service: Orthopedics;  Laterality: Right;  ? ?Patient Active Problem List  ? Diagnosis Date Noted  ? 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 BMI 33.0-33.9,adult 06/16/2015  ? Von Willebrand disease ([place]) 06/13/2015  ? Depression 12/29/2014  ? Rhinitis, allergic 12/28/2014  ? ? ?PCP: Eunice Blase, MD ?  ?REFERRING PROVIDER: Donella Stade, PA-C ?  ?REFERRING DIAG: 6.659 (ICD-10-CM) - History of total knee arthroplasty, unspecified  laterality ? ?ONSET DATE: 05/22/2021 Right TKA  ? ?THERAPY DIAG:  ?Stiffness of right knee, not elsewhere classified ? ?Acute pain of right knee ? ?Muscle weakness (generalized) ? ?Localized edema ? ?Other abnormalities of gait and mobility ? ?Unsteadiness on feet ? ?PERTINENT HISTORY: Von Willebrand disease, depression, Menorrhgia ? ?PRECAUTIONS: None ? ?SUBJECTIVE:  Pt indicated a fall in house when trying to go to the bathroom.  Xrays were negative per report.  ? ?PAIN:  ?NPRS scale: 5/10 ?Pain location: right knee all over.  Back of knee "kills" when laying elevated.  ?Pain description: sharp, sore, cramps ?Aggravating factors: fall ?Relieving factors: ice, meds ? ? ?OBJECTIVE:  ?  ?PATIENT SURVEYS:  ?05/28/2021 FOTO 29% functional & target 67% ?  ?COGNITION: ?05/28/2021 Overall cognitive status: Within functional limits for tasks assessed              ?  ?EDEMA: ?05/28/2021 ?RLE above knee 51.8cm around knee 48cm below knee  41.2cm ?LLE above knee 45.8cm around knee 40cm below knee 34.1cm ?  ?PALPATION: ?05/28/2021 Bruising along medial knee & lower leg, tenderness over joint line, quads, hamstrings ?  ?LE ROM: ?  ?ROM ?P: Passive  A: Active Right ?05/28/2021 Left ?05/28/2021 Right ?05/31/2021  ?Hip flexion       ?Hip extension       ?Hip  abduction       ?Hip adduction       ?Hip internal rotation       ?Hip external rotation       ?Knee flexion Supine ?P: 64* ?Seated  ?A: 64*   Supine AROM heel slide:61 ?PROM overpressure in heel slide : 66  ?  ?Knee extension Supine ?P: -10* ?Seated  ?A: -31*   Seated AROM LAQ: ?-21  ?Ankle dorsiflexion       ?Ankle plantarflexion       ?Ankle inversion       ?Ankle eversion       ? (Blank rows = not tested) ?  ?LE MMT: ?  ?MMT Right ?05/28/2021 Left ?05/28/2021  ?Hip flexion      ?Hip extension      ?Hip abduction      ?Hip adduction      ?Hip internal rotation      ?Hip external rotation      ?Knee flexion 2/5    ?Knee extension 2/5    ?Ankle dorsiflexion      ?Ankle plantarflexion       ?Ankle inversion      ?Ankle eversion      ? (Blank rows = not tested) ?  ?GAIT: ?05/28/2021 ?Distance walked: 6' ?Assistive device utilized:  4 footed walker ?level of assistance: SBA  verbal cues needed ?Comments: antalgic, right knee flexed in stance, limited to no increase flexion for swing, RLE abducted,  ?  ?Functional Activities: Pt using LLE to move RLE in & out of bed.  PT min guard RLE and she was able to move RLE without assisting with LLE.  ?  ?TODAY'S TREATMENT: ?05/31/2021 ? Therex:  ?  Nustep Lvl 5 UE/LE 8 mins ?  Seated LAQ c contralateral movement opposite, pause in end range flexion /extension x 10 ?  Supine heel slide AROM c quad set into extension combo x 8 ?  Standing church pew ankle strategy weight shifting 3 x 30 secs ?  Retro step Rt leg posterior x 15 ?  Incline calf stretch bilateral 30 sec x 3 c cues for knee extension ? ? Manual: ?  Seated guided PROM c mild distraction, IR into flexion Rt knee.  ? ? Vasopneumatic: ?  Supine Rt knee 34 degrees, medium compression in elevation 10 mins ? ?05/29/2021 ? Therex:  ?  Nustep Lvl 5 UE/LE 8 mins ?  Seated LAQ c contralateral movement opposite, pause in end range flexion /extension 2 x 10 ?  Seated alternating isometrics Rt knee extension/flexion 5 sec each way 2 mins ?  Standing church pew ankle strategy weight shifting 2 mins in FWW c SBA ?  Review of existing HEP c cues for techniques.  Cues given to encourage routine use throughout the day.  ? ? Manual: ?  Seated guided PROM c mild distraction, IR into flexion Rt knee.  Noted guarding from muscle to movement due to pain.    ? ? Vasopneumatic: ?  Supine Rt knee 34 degrees, medium compression in elevation 10 mins ?   ? ? ?05/28/2021 ?1 set of HEP for comprehension performed.  ?  ?  ?PATIENT EDUCATION:  ?Education details: Access Code: [email or web address] ?Person educated: Patient and Spouse ?Education method: Explanation, Demonstration, Tactile cues, Verbal cues, and Handouts ?Education comprehension:  verbalized understanding, returned demonstration, verbal cues required, tactile cues required, and needs further education ?  ?  ?HOME EXERCISE PROGRAM: ?Access Code: [email or web address] ?URL: [email or web address] ?Date: 05/28/2021 ?Prepared  by: Jamey Reas ?  ?Exercises ?- Quad Setting and Stretching  - 2-4 x daily - 7 x weekly - 5-10 sets - 10 reps - prop 5-10 minutes & quad set5 seconds hold ?- Ankle Alphabet in Elevation  - 2-4 x daily - 7 x weekly - 1 sets - 1 reps ?- Supine Heel Slide with Strap  - 2-3 x daily - 7 x weekly - 2-3 sets - 10 reps - 5 seconds hold ?- Supine Heel Slide with Small Ball  - 2-3 x daily - 7 x weekly - 2-3 sets - 10 reps - 5 seconds hold ?- Supine Straight Leg Raises  - 2-3 x daily - 7 x weekly - 2-3 sets - 10 reps - 5 seconds hold ?- Seated Knee Flexion Extension AROM   - 2-4 x daily - 7 x weekly - 2-3 sets - 10 reps - 5 seconds hold ?- Seated Knee Flexion AAROM  - 1 x daily - 5 x weekly - 1 sets - 10 reps - 5 seconds hold ?- Seated straight leg lifts  - 2-3 x daily - 7 x weekly - 2-3 sets - 10 reps - 5 seconds hold ?- Seated Hamstring Stretch with Strap  - 2-4 x daily - 7 x weekly - 1 sets - 3 reps - 20-30 seconds hold ?  ?ASSESSMENT: ?  ?CLINICAL IMPRESSION: ?Guarding still noted in movement but some improvement in ability to tolerate mid range movements.  Continued focus on improved strength, mobility in each direction and progress ambulation stability.  ?  ?OBJECTIVE IMPAIRMENTS Abnormal gait, decreased activity tolerance, decreased balance, decreased knowledge of condition, decreased knowledge of use of DME, decreased mobility, difficulty walking, decreased ROM, decreased strength, increased edema, increased muscle spasms, impaired flexibility, postural dysfunction, and pain.  ?  ?ACTIVITY LIMITATIONS community activity, driving, and personal recreation like Yoga & walking dogs .  ?  ?PERSONAL FACTORS 1-2 comorbidities: see PMH  are also affecting patient's functional  outcome.  ?  ?REHAB POTENTIAL: Good ?  ?CLINICAL DECISION MAKING: Stable/uncomplicated ?  ?EVALUATION COMPLEXITY: Low ?  ?  ?GOALS: ?Goals reviewed with patient? Yes ?  ?SHORT TERM GOALS: Target date: 06/22/2021 ?  ?Patient independent and v

## 2021-06-01 ENCOUNTER — Ambulatory Visit
Admission: RE | Admit: 2021-06-01 | Discharge: 2021-06-01 | Disposition: A | Discharge status: Home / Self Care | Payer: No Typology Code available for payment source | Source: Ambulatory Visit | Attending: Family Medicine | Admitting: Family Medicine

## 2021-06-01 ENCOUNTER — Other Ambulatory Visit: Payer: Self-pay | Admitting: Family Medicine

## 2021-06-01 DIAGNOSIS — R0600 Dyspnea, unspecified: Secondary | ICD-10-CM

## 2021-06-01 MED ORDER — IOPAMIDOL (ISOVUE-370) INJECTION 76%
75.0000 mL | Freq: Once | INTRAVENOUS | Status: AC | PRN
  Administered 2021-06-01: 75 mL via INTRAVENOUS

## 2021-06-04 NOTE — Therapy (Signed)
?OUTPATIENT PHYSICAL THERAPY TREATMENT NOTE ? ? ?Patient Name: Cheryl Tate [facility] ?MRN: [id number] ?DOB:1970-06-02, 51 y.o., female ?Today's Date: 06/05/2021 ? ? ?END OF SESSION:  ? PT End of Session - 06/05/21 1515   ? ? Visit Number 4   ? Number of Visits 20   ? Date for PT Re-Evaluation 07/20/21   ? Authorization Type UHC   ? Authorization Time Period [facility] CHOICE  NO VISIT LIMIT, PATIENT PAYS 100% UNTIL DED IS MET, DED $7500 [id number] MET, TERM DATE 09/19/2021   ? Progress Note Due on Visit 10   ? PT Start Time 1937   ? PT Stop Time 9024   ? PT Time Calculation (min) 54 min   ? Activity Tolerance Patient limited by pain   ? Behavior During Therapy [facility] for tasks assessed/performed   ? ?  ?  ? ?  ? ? ? ? ?Past Medical History:  ?Diagnosis Date  ? Anemia   ? Depression   ? Menorrhagia   ? Mood disorder ([place])   ? Von Willebrand's disease [facility])   ? followed by hemotologist Dr. Marin Olp  ? ?Past Surgical History:  ?Procedure Laterality Date  ? APPENDECTOMY  01/22/2004  ? DILATION AND CURETTAGE OF UTERUS  2002  ? x 2 in 2002  ? DILITATION & CURRETTAGE/HYSTROSCOPY WITH HYDROTHERMAL ABLATION N/A 11/22/2014  ? Procedure: HYSTEROSCOPY WITH HYDROTHERMAL ABLATION;  Surgeon: Dian Queen, MD;  Location: [facility];  Service: Gynecology;  Laterality: N/A;  ? HYSTEROSCOPY WITH D & C  08-08-2014  in [place]  ? MULTIPLE TOOTH EXTRACTIONS    ? TOTAL KNEE ARTHROPLASTY Right 05/22/2021  ? Procedure: RIGHT TOTAL KNEE ARTHROPLASTY;  Surgeon: Meredith Pel, MD;  Location: [place];  Service: Orthopedics;  Laterality: Right;  ? ?Patient Active Problem List  ? Diagnosis Date Noted  ? 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 BMI 33.0-33.9,adult 06/16/2015  ? Von Willebrand disease ([place]) 06/13/2015  ? Depression 12/29/2014  ? Rhinitis, allergic 12/28/2014  ? ? ?PCP: Eunice Blase, MD ?  ?REFERRING PROVIDER: Donella Stade, PA-C ?  ?REFERRING DIAG: 6.659 (ICD-10-CM) - History of total knee arthroplasty, unspecified  laterality ? ?ONSET DATE: 05/22/2021 Right TKA  ? ?THERAPY DIAG:  ?Stiffness of right knee, not elsewhere classified ? ?Acute pain of right knee ? ?Localized edema ? ?Muscle weakness (generalized) ? ?Other abnormalities of gait and mobility ? ?Unsteadiness on feet ? ?PERTINENT HISTORY: Von Willebrand disease, depression, Menorrhgia ? ?PRECAUTIONS: None ? ?SUBJECTIVE:  She spread out Oxy to every 5 hrs. She tried every 6 hours Sunday but had increased pain.  She went back to every 5 hours. She feels that she walked the best she has since surgery today.  She has been using CPM daily up to 5 hours.  ? ?PAIN:  ?NPRS scale:  1-2/10 today and since last PT session worst 8-9/10 ?Pain location: right knee all over.  Back of knee "kills" when laying elevated.  ?Pain description: sharp, sore, cramps ?Aggravating factors: fall ?Relieving factors: ice, meds ? ? ?OBJECTIVE:  ?  ?PATIENT SURVEYS:  ?05/28/2021 FOTO 29% functional & target 67% ?  ?COGNITION: ?05/28/2021 Overall cognitive status: Within functional limits for tasks assessed              ?  ?EDEMA: ?05/28/2021 ?RLE above knee 51.8cm around knee 48cm below knee  41.2cm ?LLE above knee 45.8cm around knee 40cm below knee 34.1cm ?  ?PALPATION: ?05/28/2021 Bruising along medial knee & lower leg, tenderness over  joint line, quads, hamstrings ?  ?LE ROM: ?  ?ROM ?P: Passive  A: Active Right ?05/28/2021 Right ?05/31/2021 Right ?06/05/21  ?Hip flexion      ?Hip extension      ?Hip abduction      ?Hip adduction      ?Hip internal rotation      ?Hip external rotation      ?Knee flexion Supine ?P: 64* ?Seated  ?A: 64* Supine AROM heel slide:61 ?PROM overpressure in heel slide : 66  ? Supine ?AA: 73*  ?Knee extension Supine ?P: -10* ?Seated  ?A: -31* Seated AROM LAQ: ?-21 Seated ?LAQ A: -14*  ?Ankle dorsiflexion      ?Ankle plantarflexion      ?Ankle inversion      ?Ankle eversion      ? (Blank rows = not tested) ?  ?LE MMT: ?  ?MMT Right ?05/28/2021 Left ?05/28/2021  ?Hip flexion      ?Hip  extension      ?Hip abduction      ?Hip adduction      ?Hip internal rotation      ?Hip external rotation      ?Knee flexion 2/5    ?Knee extension 2/5    ?Ankle dorsiflexion      ?Ankle plantarflexion      ?Ankle inversion      ?Ankle eversion      ? (Blank rows = not tested) ?  ?GAIT: ?05/28/2021 ?Distance walked: 12' ?Assistive device utilized:  4 footed walker ?level of assistance: SBA  verbal cues needed ?Comments: antalgic, right knee flexed in stance, limited to no increase flexion for swing, RLE abducted,  ?  ?Functional Activities: Pt using LLE to move RLE in & out of bed.  PT min guard RLE and she was able to move RLE without assisting with LLE.  ?  ?TODAY'S TREATMENT: ?06/05/2021 ?Therex:  ? Nustep Lvl 5 seat 8 BLEs & BUEs 8 mins full motion ? Leg Press BLEs 75# 10 reps 3 sets.  ? Seated LAQ c contralateral movement opposite, pause in end range flexion /extension x 10 reps ? Supine with both feet on 55cm ball / heel slide AAROM strap c quad set into extension 5 sec & flexion 5 sec 10 reps ? ?Neuromuscular: tandem stance on floor 30 sec RLE in front & in back  ? ?Therapeutic Activities: pre-gait at counter rolling off toes for RLE knee flexion terminal stance, wt shift onto RLE initial contact & terminal stance.  Carryover in gait with RW.  ? ?Self-Care:  PT instructed in positioning in bed with pillows to "tent" sheets off feet and pillow bw LEs in sidelying. Pt verbalized understanding. ? ?Manual:  PROM ext & flex during rests on Leg Press  ?Seated guided PROM c mild distraction, IR into flexion Rt knee.  ? ?Vasopneumatic:  Supine Rt knee 34 degrees, medium compression in elevation 10 mins ? ?05/31/2021 ?Therex:  ? Nustep Lvl 5 UE/LE 8 mins ? Seated LAQ c contralateral movement opposite, pause in end range flexion /extension x 10 ? Supine heel slide AROM c quad set into extension combo x 8 ? Standing church pew ankle strategy weight shifting 3 x 30 secs ? Retro step Rt leg posterior x 15 ? Incline calf  stretch bilateral 30 sec x 3 c cues for knee extension ? ?Manual:  Seated guided PROM c mild distraction, IR into flexion Rt knee.  ? ?Vasopneumatic:  Supine Rt knee 34 degrees, medium compression  in elevation 10 mins ? ?05/29/2021 ?Therex:  ? Nustep Lvl 5 UE/LE 8 mins ? Seated LAQ c contralateral movement opposite, pause in end range flexion /extension 2 x 10 ? Seated alternating isometrics Rt knee extension/flexion 5 sec each way 2 mins ? Standing church pew ankle strategy weight shifting 2 mins in FWW c SBA ? Review of existing HEP c cues for techniques.  Cues given to encourage routine use throughout the day.  ? ?Manual:  Seated guided PROM c mild distraction, IR into flexion Rt knee.  Noted guarding from muscle to movement due to pain.    ? ?Vasopneumatic:  Supine Rt knee 34 degrees, medium compression in elevation 10 mins ?  ?  ?  ?PATIENT EDUCATION:  ?Education details: Access Code: [email or web address] ?Person educated: Patient and Spouse ?Education method: Explanation, Demonstration, Tactile cues, Verbal cues, and Handouts ?Education comprehension: verbalized understanding, returned demonstration, verbal cues required, tactile cues required, and needs further education ?  ?  ?HOME EXERCISE PROGRAM: ?Access Code: [email or web address] ?URL: [email or web address] ?Date: 05/28/2021 ?Prepared by: Jamey Reas ?  ?Exercises ?- Quad Setting and Stretching  - 2-4 x daily - 7 x weekly - 5-10 sets - 10 reps - prop 5-10 minutes & quad set5 seconds hold ?- Ankle Alphabet in Elevation  - 2-4 x daily - 7 x weekly - 1 sets - 1 reps ?- Supine Heel Slide with Strap  - 2-3 x daily - 7 x weekly - 2-3 sets - 10 reps - 5 seconds hold ?- Supine Heel Slide with Small Ball  - 2-3 x daily - 7 x weekly - 2-3 sets - 10 reps - 5 seconds hold ?- Supine Straight Leg Raises  - 2-3 x daily - 7 x weekly - 2-3 sets - 10 reps - 5 seconds hold ?- Seated Knee Flexion Extension AROM   - 2-4 x daily - 7 x weekly - 2-3 sets - 10 reps - 5 seconds hold ?-  Seated Knee Flexion AAROM  - 1 x daily - 5 x weekly - 1 sets - 10 reps - 5 seconds hold ?- Seated straight leg lifts  - 2-3 x daily - 7 x weekly - 2-3 sets - 10 reps - 5 seconds hold ?- Seated Hamstring Stretch with Strap  - 2-4 x

## 2021-06-05 ENCOUNTER — Encounter: Payer: Self-pay | Admitting: Physical Therapy

## 2021-06-05 ENCOUNTER — Ambulatory Visit (INDEPENDENT_AMBULATORY_CARE_PROVIDER_SITE_OTHER): Payer: No Typology Code available for payment source | Admitting: Physical Therapy

## 2021-06-05 DIAGNOSIS — M25661 Stiffness of right knee, not elsewhere classified: Secondary | ICD-10-CM

## 2021-06-05 DIAGNOSIS — M25561 Pain in right knee: Secondary | ICD-10-CM | POA: Diagnosis not present

## 2021-06-05 DIAGNOSIS — R2681 Unsteadiness on feet: Secondary | ICD-10-CM

## 2021-06-05 DIAGNOSIS — R600 Localized edema: Secondary | ICD-10-CM | POA: Diagnosis not present

## 2021-06-05 DIAGNOSIS — M6281 Muscle weakness (generalized): Secondary | ICD-10-CM

## 2021-06-05 DIAGNOSIS — R2689 Other abnormalities of gait and mobility: Secondary | ICD-10-CM

## 2021-06-06 ENCOUNTER — Encounter: Payer: Self-pay | Admitting: Orthopedic Surgery

## 2021-06-06 ENCOUNTER — Encounter: Payer: Self-pay | Admitting: Physical Therapy

## 2021-06-06 ENCOUNTER — Ambulatory Visit (INDEPENDENT_AMBULATORY_CARE_PROVIDER_SITE_OTHER): Payer: No Typology Code available for payment source | Admitting: Orthopedic Surgery

## 2021-06-06 ENCOUNTER — Other Ambulatory Visit: Payer: Self-pay | Admitting: Surgical

## 2021-06-06 ENCOUNTER — Ambulatory Visit (INDEPENDENT_AMBULATORY_CARE_PROVIDER_SITE_OTHER): Payer: No Typology Code available for payment source | Admitting: Physical Therapy

## 2021-06-06 DIAGNOSIS — M6281 Muscle weakness (generalized): Secondary | ICD-10-CM

## 2021-06-06 DIAGNOSIS — R600 Localized edema: Secondary | ICD-10-CM | POA: Diagnosis not present

## 2021-06-06 DIAGNOSIS — R2681 Unsteadiness on feet: Secondary | ICD-10-CM

## 2021-06-06 DIAGNOSIS — M25661 Stiffness of right knee, not elsewhere classified: Secondary | ICD-10-CM

## 2021-06-06 DIAGNOSIS — M25561 Pain in right knee: Secondary | ICD-10-CM | POA: Diagnosis not present

## 2021-06-06 DIAGNOSIS — R2689 Other abnormalities of gait and mobility: Secondary | ICD-10-CM

## 2021-06-06 DIAGNOSIS — Z96659 Presence of unspecified artificial knee joint: Secondary | ICD-10-CM

## 2021-06-06 MED ORDER — OXYCODONE HCL 5 MG PO TABS: 5.0000 mg | ORAL_TABLET | Freq: Four times a day (QID) | ORAL | 0 refills | Status: DC | PRN

## 2021-06-06 NOTE — Therapy (Signed)
?OUTPATIENT PHYSICAL THERAPY TREATMENT NOTE ? ? ?Patient Name: Cheryl Tate [facility] ?MRN: [id number] ?DOB:1970-09-05, 51 y.o., female ?Today's Date: 06/06/2021 ? ? ?END OF SESSION:  ? PT End of Session - 06/06/21 1884   ? ? Visit Number 5   ? Number of Visits 20   ? Date for PT Re-Evaluation 07/20/21   ? Authorization Type UHC   ? Authorization Time Period [facility] CHOICE  NO VISIT LIMIT, PATIENT PAYS 100% UNTIL DED IS MET, DED $7500 [id number] MET, TERM DATE 09/19/2021   ? Progress Note Due on Visit 10   ? PT Start Time 0930   ? PT Stop Time 1025   ? PT Time Calculation (min) 55 min   ? Activity Tolerance Patient limited by pain   ? Behavior During Therapy [facility] for tasks assessed/performed   ? ?  ?  ? ?  ? ? ? ? ? ?Past Medical History:  ?Diagnosis Date  ? Anemia   ? Depression   ? Menorrhagia   ? Mood disorder ([place])   ? Von Willebrand's disease [facility])   ? followed by hemotologist Dr. Marin Olp  ? ?Past Surgical History:  ?Procedure Laterality Date  ? APPENDECTOMY  01/22/2004  ? DILATION AND CURETTAGE OF UTERUS  2002  ? x 2 in 2002  ? DILITATION & CURRETTAGE/HYSTROSCOPY WITH HYDROTHERMAL ABLATION N/A 11/22/2014  ? Procedure: HYSTEROSCOPY WITH HYDROTHERMAL ABLATION;  Surgeon: Dian Queen, MD;  Location: [facility];  Service: Gynecology;  Laterality: N/A;  ? HYSTEROSCOPY WITH D & C  08-08-2014  in [place]  ? MULTIPLE TOOTH EXTRACTIONS    ? TOTAL KNEE ARTHROPLASTY Right 05/22/2021  ? Procedure: RIGHT TOTAL KNEE ARTHROPLASTY;  Surgeon: Meredith Pel, MD;  Location: [place];  Service: Orthopedics;  Laterality: Right;  ? ?Patient Active Problem List  ? Diagnosis Date Noted  ? 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 BMI 33.0-33.9,adult 06/16/2015  ? Von Willebrand disease ([place]) 06/13/2015  ? Depression 12/29/2014  ? Rhinitis, allergic 12/28/2014  ? ? ?PCP: Eunice Blase, MD ?  ?REFERRING PROVIDER: Donella Stade, PA-C ?  ?REFERRING DIAG: 6.659 (ICD-10-CM) - History of total knee arthroplasty, unspecified  laterality ? ?ONSET DATE: 05/22/2021 Right TKA  ? ?THERAPY DIAG:  ?Stiffness of right knee, not elsewhere classified ? ?Acute pain of right knee ? ?Localized edema ? ?Muscle weakness (generalized) ? ?Other abnormalities of gait and mobility ? ?Unsteadiness on feet ? ?PERTINENT HISTORY: Von Willebrand disease, depression, Menorrhgia ? ?PRECAUTIONS: None ? ?SUBJECTIVE:  She tried some of positioning in bed last night and it helped.  She was sore from PT yesterday.  ? ?PAIN:  ?NPRS scale:  4-5/10 today and up to 9/10 ?Pain location: right knee all over.  Back of knee "kills" when laying elevated.  ?Pain description: sharp, sore, cramps ?Aggravating factors: fall ?Relieving factors: ice, meds ? ? ?OBJECTIVE:  ?  ?PATIENT SURVEYS:  ?05/28/2021 FOTO 29% functional & target 67% ?  ?COGNITION: ?05/28/2021 Overall cognitive status: Within functional limits for tasks assessed              ?  ?EDEMA: ?05/28/2021 ?RLE above knee 51.8cm around knee 48cm below knee  41.2cm ?LLE above knee 45.8cm around knee 40cm below knee 34.1cm ?  ?PALPATION: ?05/28/2021 Bruising along medial knee & lower leg, tenderness over joint line, quads, hamstrings ?  ?LE ROM: ?  ?ROM ?P: Passive  A: Active Right ?05/28/2021 Right ?05/31/2021 Right ?06/05/21 Right ?06/06/21  ?Hip flexion       ?  Hip extension       ?Hip abduction       ?Hip adduction       ?Hip internal rotation       ?Hip external rotation       ?Knee flexion Supine ?P: 64* ?Seated  ?A: 64* Supine AROM heel slide:61 ?PROM overpressure in heel slide : 66  ? Supine ?AA: 73* Seated  ?P: 75*  ?Knee extension Supine ?P: -10* ?Seated  ?A: -31* Seated AROM LAQ: ?-21 Seated ?LAQ A: -14* Supine ?P: -9*  ?Ankle dorsiflexion       ?Ankle plantarflexion       ?Ankle inversion       ?Ankle eversion       ? (Blank rows = not tested) ?  ?LE MMT: ?  ?MMT Right ?05/28/2021 Left ?05/28/2021  ?Hip flexion      ?Hip extension      ?Hip abduction      ?Hip adduction      ?Hip internal rotation      ?Hip external rotation       ?Knee flexion 2/5    ?Knee extension 2/5    ?Ankle dorsiflexion      ?Ankle plantarflexion      ?Ankle inversion      ?Ankle eversion      ? (Blank rows = not tested) ?  ?GAIT: ?05/28/2021 ?Distance walked: 23' ?Assistive device utilized:  4 footed walker ?level of assistance: SBA  verbal cues needed ?Comments: antalgic, right knee flexed in stance, limited to no increase flexion for swing, RLE abducted,  ?  ?Functional Activities: Pt using LLE to move RLE in & out of bed.  PT min guard RLE and she was able to move RLE without assisting with LLE.  ?  ?TODAY'S TREATMENT: ?06/06/2021 ?Therex:  ? Recumbent bike SciFit seat 9 rocking back/forth for knee flexion stretch using BLEs & BUEs for 6 min ? Leg Press BLEs 75# 10 reps 3 sets.  ? Seated LAQ c contralateral movement opposite, pause in end range flexion /extension x 15 reps ? Supine with both feet on 55cm ball / heel slide AAROM strap c quad set into extension 5 sec & flexion 5 sec 15 reps ? ?Neuromuscular: tandem stance on floor 30 sec RLE in front & in back  ? ?Therapeutic Activities: gait with RW with cues for rolling off toes for RLE knee flexion terminal stance & improved swing. ? ?Self-Care:  Pt demo how she used pillow to position LEs in bed which was correct.  ? ?Manual:  PROM ext & flex during rests on Leg Press  ?Seated guided PROM c mild distraction, IR into flexion Rt knee and contract relax  ? ?Vasopneumatic:  Supine Rt knee 34 degrees, medium compression in elevation 10 mins ? ?06/05/2021 ?Therex:  ? Nustep Lvl 5 seat 8 BLEs & BUEs 8 mins full motion ? Leg Press BLEs 75# 10 reps 3 sets.  ? Seated LAQ c contralateral movement opposite, pause in end range flexion /extension x 10 reps ? Supine with both feet on 55cm ball / heel slide AAROM strap c quad set into extension 5 sec & flexion 5 sec 10 reps ? ?Neuromuscular: tandem stance on floor 30 sec RLE in front & in back  ? ?Therapeutic Activities: pre-gait at counter rolling off toes for RLE knee flexion  terminal stance, wt shift onto RLE initial contact & terminal stance.  Carryover in gait with RW.  ? ?Self-Care:  PT instructed  in positioning in bed with pillows to "tent" sheets off feet and pillow bw LEs in sidelying. Pt verbalized understanding. ? ?Manual:  PROM ext & flex during rests on Leg Press  ?Seated guided PROM c mild distraction, IR into flexion Rt knee.  ? ?Vasopneumatic:  Supine Rt knee 34 degrees, medium compression in elevation 10 mins ? ?05/31/2021 ?Therex:  ? Nustep Lvl 5 UE/LE 8 mins ? Seated LAQ c contralateral movement opposite, pause in end range flexion /extension x 10 ? Supine heel slide AROM c quad set into extension combo x 8 ? Standing church pew ankle strategy weight shifting 3 x 30 secs ? Retro step Rt leg posterior x 15 ? Incline calf stretch bilateral 30 sec x 3 c cues for knee extension ? ?Manual:  Seated guided PROM c mild distraction, IR into flexion Rt knee.  ? ?Vasopneumatic:  Supine Rt knee 34 degrees, medium compression in elevation 10 mins ? ?  ?PATIENT EDUCATION:  ?Education details: Access Code: [email or web address] ?Person educated: Patient and Spouse ?Education method: Explanation, Demonstration, Tactile cues, Verbal cues, and Handouts ?Education comprehension: verbalized understanding, returned demonstration, verbal cues required, tactile cues required, and needs further education ?  ?  ?HOME EXERCISE PROGRAM: ?Access Code: [email or web address] ?URL: [email or web address] ?Date: 05/28/2021 ?Prepared by: Jamey Reas ?  ?Exercises ?- Quad Setting and Stretching  - 2-4 x daily - 7 x weekly - 5-10 sets - 10 reps - prop 5-10 minutes & quad set5 seconds hold ?- Ankle Alphabet in Elevation  - 2-4 x daily - 7 x weekly - 1 sets - 1 reps ?- Supine Heel Slide with Strap  - 2-3 x daily - 7 x weekly - 2-3 sets - 10 reps - 5 seconds hold ?- Supine Heel Slide with Small Ball  - 2-3 x daily - 7 x weekly - 2-3 sets - 10 reps - 5 seconds hold ?- Supine Straight Leg Raises  - 2-3 x daily - 7 x  weekly - 2-3 sets - 10 reps - 5 seconds hold ?- Seated Knee Flexion Extension AROM   - 2-4 x daily - 7 x weekly - 2-3 sets - 10 reps - 5 seconds hold ?- Seated Knee Flexion AAROM  - 1 x daily - 5 x weekly - 1 sets - 10 reps

## 2021-06-06 NOTE — Progress Notes (Signed)
? ?Post-Op Visit Note ?  ?Patient: Cheryl Tate           ?Date of Birth: 16-Aug-1970           ?MRN: [id number] ?Visit Date: 06/06/2021 ?PCP: Eunice Blase, MD ? ? ?Assessment & Plan: ? ?Chief Complaint:  ?Chief Complaint  ?Patient presents with  ? Right Knee - Routine Post Op  ?  05/22/21 right TKA  ? ?Visit Diagnoses:  ?1. History of total knee arthroplasty, unspecified laterality   ? ? ?Plan: Cheryl Tate is a 51 year old patient who is now 2 weeks out right total knee replacement.  Helpful after surgery but radiographs look good.  Still having some knee pain as expected.  She does have von Willebrand's disease.  Ultrasound prior to discharge negative for DVT.  She also has had a CT scan ordered by her primary care provider which was negative for pulmonary embolism after her D-dimer level was up.  She has been wearing TED hose and we decided since she was up ambulating around 2 not go with chemical prophylaxis at this time primarily because of the high expense. ? ?On examination she has about 15 degree flexion contracture and can bend to about 65.  She is at 65 on the CPM machine.  Want her to really work on full extension and we discussed that today and I showed her how to do that.  Also work on flexion follow-up in 4 weeks for clinical recheck.  Negative Homans no calf tenderness today. ? ?Follow-Up Instructions: Return in about 4 weeks (around 07/04/2021).  ? ?Orders:  ?No orders of the defined types were placed in this encounter. ? ?No orders of the defined types were placed in this encounter. ? ? ?Imaging: ?No results found. ? ?PMFS History: ?Patient Active Problem List  ? Diagnosis Date Noted  ? 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 BMI 33.0-33.9,adult 06/16/2015  ? Von Willebrand disease ([place]) 06/13/2015  ? Depression 12/29/2014  ? Rhinitis, allergic 12/28/2014  ? ?Past Medical History:  ?Diagnosis Date  ? Anemia   ? Depression   ? Menorrhagia   ? Mood disorder ([place])   ? Von Willebrand's disease [facility])   ?  followed by hemotologist Dr. Marin Olp  ?  ?Family History  ?Problem Relation Age of Onset  ? Mental retardation Mother   ? Mental retardation Brother   ? Mental retardation Maternal Grandmother   ?  ?Past Surgical History:  ?Procedure Laterality Date  ? APPENDECTOMY  01/22/2004  ? DILATION AND CURETTAGE OF UTERUS  2002  ? x 2 in 2002  ? DILITATION & CURRETTAGE/HYSTROSCOPY WITH HYDROTHERMAL ABLATION N/A 11/22/2014  ? Procedure: HYSTEROSCOPY WITH HYDROTHERMAL ABLATION;  Surgeon: Dian Queen, MD;  Location: [facility];  Service: Gynecology;  Laterality: N/A;  ? HYSTEROSCOPY WITH D & C  08-08-2014  in [place]  ? MULTIPLE TOOTH EXTRACTIONS    ? TOTAL KNEE ARTHROPLASTY Right 05/22/2021  ? Procedure: RIGHT TOTAL KNEE ARTHROPLASTY;  Surgeon: Meredith Pel, MD;  Location: [place];  Service: Orthopedics;  Laterality: Right;  ? ?Social History  ? ?Occupational History  ? Occupation: Biomedical scientist, Tree surgeon  ?Tobacco Use  ? Smoking status: Every Day  ?  Packs/day: 0.20  ?  Years: 25.00  ?  Pack years: 5.00  ?  Types: Cigarettes  ? Smokeless tobacco: Never  ? Tobacco comments:  ?  2 cig daily  ?Vaping Use  ? Vaping Use: Never used  ?Substance and Sexual Activity  ?  Alcohol use: Yes  ?  Alcohol/week: 7.0 standard drinks  ?  Types: 7 Glasses of wine per week  ?  Comment: one wine daily  ? Drug use: Yes  ?  Types: Marijuana  ?  Comment: helps with sleep nightly  ? Sexual activity: Yes  ? ? ? ?

## 2021-06-07 ENCOUNTER — Ambulatory Visit (INDEPENDENT_AMBULATORY_CARE_PROVIDER_SITE_OTHER): Payer: No Typology Code available for payment source | Admitting: Physical Therapy

## 2021-06-07 ENCOUNTER — Encounter: Payer: Self-pay | Admitting: Physical Therapy

## 2021-06-07 DIAGNOSIS — M6281 Muscle weakness (generalized): Secondary | ICD-10-CM

## 2021-06-07 DIAGNOSIS — R2689 Other abnormalities of gait and mobility: Secondary | ICD-10-CM

## 2021-06-07 DIAGNOSIS — M25661 Stiffness of right knee, not elsewhere classified: Secondary | ICD-10-CM

## 2021-06-07 DIAGNOSIS — R600 Localized edema: Secondary | ICD-10-CM

## 2021-06-07 DIAGNOSIS — M25561 Pain in right knee: Secondary | ICD-10-CM

## 2021-06-07 NOTE — Therapy (Signed)
OUTPATIENT PHYSICAL THERAPY TREATMENT NOTE   Patient Name: Cheryl Tate MRN: [id number] DOB:1970/10/06, 51 y.o., female Today's Date: 06/07/2021   END OF SESSION:   PT End of Session - 06/07/21 1532     Visit Number 6    Number of Visits 20    Date for PT Re-Evaluation 07/20/21    Authorization Type UHC    Authorization Time Period [facility] CHOICE  NO VISIT LIMIT, PATIENT PAYS 100% UNTIL DED IS MET, DED $7500 [id number] MET, TERM DATE 09/19/2021    Progress Note Due on Visit 10    PT Start Time 1431    PT Stop Time 1516    PT Time Calculation (min) 45 min    Activity Tolerance Patient limited by pain    Behavior During Therapy [facility] for tasks assessed/performed                Past Medical History:  Diagnosis Date   Anemia    Depression    Menorrhagia    Mood disorder ([place])    Von Willebrand's disease ([place])    followed by hemotologist Dr. Marin Olp   Past Surgical History:  Procedure Laterality Date   APPENDECTOMY  01/22/2004   DILATION AND CURETTAGE OF UTERUS  2002   x 2 in 2002   DILITATION & CURRETTAGE/HYSTROSCOPY WITH HYDROTHERMAL ABLATION N/A 11/22/2014   Procedure: HYSTEROSCOPY WITH HYDROTHERMAL ABLATION;  Surgeon: Dian Queen, MD;  Location: [place];  Service: Gynecology;  Laterality: N/A;   HYSTEROSCOPY WITH D & C  08-08-2014  in [place]   MULTIPLE TOOTH EXTRACTIONS     TOTAL KNEE ARTHROPLASTY Right 05/22/2021   Procedure: RIGHT TOTAL KNEE ARTHROPLASTY;  Surgeon: Meredith Pel, MD;  Location: [place];  Service: Orthopedics;  Laterality: Right;   Patient Active Problem List   Diagnosis Date Noted   S/P knee replacement 05/22/2021   BMI 33.0-33.9,adult 06/16/2015   Von Willebrand disease ([place]) 06/13/2015   Depression 12/29/2014   Rhinitis, allergic 12/28/2014    PCP: Eunice Blase, MD   REFERRING PROVIDER: Donella Stade, PA-C   REFERRING DIAG: [phone] (ICD-10-CM) - History of total knee arthroplasty, unspecified  laterality  ONSET DATE: 05/22/2021 Right TKA   THERAPY DIAG:  Stiffness of right knee, not elsewhere classified  Acute pain of right knee  Localized edema  Muscle weakness (generalized)  Other abnormalities of gait and mobility  PERTINENT HISTORY: Von Willebrand disease, depression, Menorrhgia  PRECAUTIONS: None  SUBJECTIVE:  She relays the pain is bad, she uses the ice all the time, she still uses the CPM machine  PAIN:  NPRS scale:  4-5/10 today and up to 9/10 Pain location: right knee all over.  Back of knee "kills" when laying elevated.  Pain description: sharp, sore, cramps Aggravating factors: fall Relieving factors: ice, meds   OBJECTIVE:    PATIENT SURVEYS:  05/28/2021 FOTO 29% functional & target 67%   COGNITION: 05/28/2021 Overall cognitive status: Within functional limits for tasks assessed                EDEMA: 05/28/2021 RLE above knee 51.8cm around knee 48cm below knee  41.2cm LLE above knee 45.8cm around knee 40cm below knee 34.1cm   PALPATION: 05/28/2021 Bruising along medial knee & lower leg, tenderness over joint line, quads, hamstrings   LE ROM:   ROM P: Passive  A: Active Right 05/28/2021 Right 05/31/2021 Right 06/05/21 Right 06/06/21  Hip flexion       Hip extension  Hip abduction       Hip adduction       Hip internal rotation       Hip external rotation       Knee flexion Supine P: 64* Seated  A: 64* Supine AROM heel slide:61 PROM overpressure in heel slide : 66   Supine AA: 73* Seated  P: 75*  Knee extension Supine P: -10* Seated  A: -31* Seated AROM LAQ: -21 Seated LAQ A: -14* Supine P: -9*  Ankle dorsiflexion       Ankle plantarflexion       Ankle inversion       Ankle eversion        (Blank rows = not tested)   LE MMT:   MMT Right 05/28/2021 Left 05/28/2021  Hip flexion      Hip extension      Hip abduction      Hip adduction      Hip internal rotation      Hip external rotation      Knee flexion 2/5    Knee  extension 2/5    Ankle dorsiflexion      Ankle plantarflexion      Ankle inversion      Ankle eversion       (Blank rows = not tested)   GAIT: 05/28/2021 Distance walked: 54' Assistive device utilized:  4 footed walker level of assistance: SBA  verbal cues needed Comments: antalgic, right knee flexed in stance, limited to no increase flexion for swing, RLE abducted,    Functional Activities: Pt using LLE to move RLE in & out of bed.  PT min guard RLE and she was able to move RLE without assisting with LLE.    TODAY'S TREATMENT: 06/07/21 Nu step X 10 min L5 with 5 sec holds at max ROM Leg Press BLEs 75# 10 reps 3 sets with 5 sec holds at max ROM Seated knee flexion stretch AAROM left on top of Rt 10 sec hold X 3 min Seated LAQ c contralateral movement opposite, pause in end range flexion /extension x 3 min Supine heel slides AAROM 5 sec hold at max flexion, then moving into quad set 5 sec hold into max extension X 3 min Manual therapy: Rt knee PROM for flexion and extension, Rt flexion mobs grade 2    06/06/2021 Therex:   Recumbent bike SciFit seat 9 rocking back/forth for knee flexion stretch using BLEs & BUEs for 6 min  Leg Press BLEs 75# 10 reps 3 sets.     Supine with both feet on 55cm ball / heel slide AAROM strap c quad set into extension 5 sec & flexion 5 sec 15 reps  Neuromuscular: tandem stance on floor 30 sec RLE in front & in back   Therapeutic Activities: gait with RW with cues for rolling off toes for RLE knee flexion terminal stance & improved swing.  Self-Care:  Pt demo how she used pillow to position LEs in bed which was correct.   Manual:  PROM ext & flex during rests on Leg Press  Seated guided PROM c mild distraction, IR into flexion Rt knee and contract relax   Vasopneumatic:  Supine Rt knee 34 degrees, medium compression in elevation 10 mins  06/05/2021 Therex:   Nustep Lvl 5 seat 8 BLEs & BUEs 8 mins full motion  Leg Press BLEs 75# 10 reps 3 sets.    Seated LAQ c contralateral movement opposite, pause in end range flexion /extension x  10 reps  Supine with both feet on 55cm ball / heel slide AAROM strap c quad set into extension 5 sec & flexion 5 sec 10 reps  Neuromuscular: tandem stance on floor 30 sec RLE in front & in back   Therapeutic Activities: pre-gait at counter rolling off toes for RLE knee flexion terminal stance, wt shift onto RLE initial contact & terminal stance.  Carryover in gait with RW.   Self-Care:  PT instructed in positioning in bed with pillows to "tent" sheets off feet and pillow bw LEs in sidelying. Pt verbalized understanding.  Manual:  PROM ext & flex during rests on Leg Press  Seated guided PROM c mild distraction, IR into flexion Rt knee.   Vasopneumatic:  Supine Rt knee 34 degrees, medium compression in elevation 10 mins  05/31/2021 Therex:   Nustep Lvl 5 UE/LE 8 mins  Seated LAQ c contralateral movement opposite, pause in end range flexion /extension x 10  Supine heel slide AROM c quad set into extension combo x 8  Standing church pew ankle strategy weight shifting 3 x 30 secs  Retro step Rt leg posterior x 15  Incline calf stretch bilateral 30 sec x 3 c cues for knee extension  Manual:  Seated guided PROM c mild distraction, IR into flexion Rt knee.   Vasopneumatic:  Supine Rt knee 34 degrees, medium compression in elevation 10 mins    PATIENT EDUCATION:  Education details: Access Code: [email or web address] Person educated: Patient and Spouse Education method: Explanation, Demonstration, Tactile cues, Verbal cues, and Handouts Education comprehension: verbalized understanding, returned demonstration, verbal cues required, tactile cues required, and needs further education     HOME EXERCISE PROGRAM: Access Code: [email or web address] URL: [email or web address] Date: 05/28/2021 Prepared by: Jamey Reas   Exercises - Quad Setting and Stretching  - 2-4 x daily - 7 x weekly - 5-10 sets - 10 reps - prop  5-10 minutes & quad set5 seconds hold - Ankle Alphabet in Elevation  - 2-4 x daily - 7 x weekly - 1 sets - 1 reps - Supine Heel Slide with Strap  - 2-3 x daily - 7 x weekly - 2-3 sets - 10 reps - 5 seconds hold - Supine Heel Slide with Small Ball  - 2-3 x daily - 7 x weekly - 2-3 sets - 10 reps - 5 seconds hold - Supine Straight Leg Raises  - 2-3 x daily - 7 x weekly - 2-3 sets - 10 reps - 5 seconds hold - Seated Knee Flexion Extension AROM   - 2-4 x daily - 7 x weekly - 2-3 sets - 10 reps - 5 seconds hold - Seated Knee Flexion AAROM  - 1 x daily - 5 x weekly - 1 sets - 10 reps - 5 seconds hold - Seated straight leg lifts  - 2-3 x daily - 7 x weekly - 2-3 sets - 10 reps - 5 seconds hold - Seated Hamstring Stretch with Strap  - 2-4 x daily - 7 x weekly - 1 sets - 3 reps - 20-30 seconds hold   ASSESSMENT:   CLINICAL IMPRESSION: I cautioned her about icing too much as this could also make her knee stiff and this is a long time with her knee immobilized to ice where she could be moving it. Extension ROM progressing along better than flexion ROM currently and we will work to emphasize ROM as she can tolerate.    OBJECTIVE IMPAIRMENTS Abnormal gait, decreased activity tolerance, decreased balance, decreased  knowledge of condition, decreased knowledge of use of DME, decreased mobility, difficulty walking, decreased ROM, decreased strength, increased edema, increased muscle spasms, impaired flexibility, postural dysfunction, and pain.    ACTIVITY LIMITATIONS community activity, driving, and personal recreation like Yoga & walking dogs .    PERSONAL FACTORS 1-2 comorbidities: see PMH  are also affecting patient's functional outcome.    REHAB POTENTIAL: Good   CLINICAL DECISION MAKING: Stable/uncomplicated   EVALUATION COMPLEXITY: Low     GOALS: Goals reviewed with patient? Yes   SHORT TERM GOALS: Target date: 06/22/2021   Patient independent and verbalizes compliance with initial HEP Baseline:  SEE OBJECTIVE DATA Goal status: on going 05/29/2021   2.  Patient reports 50% improvement in right knee pain. Baseline: SEE OBJECTIVE DATA Goal status: on going 05/29/2021   3.  PROM right knee extension -3* to flexion 90* Baseline: SEE OBJECTIVE DATA Goal status: on going 05/29/2021   LONG TERM GOALS: Target date: 07/20/2021   Patient will improve FOTO score to 67% Baseline: SEE OBJECTIVE DATA Goal status: INITIAL   2.  Patient reports right knee pain </= 2/10 with standing & gait activities.  Baseline: SEE OBJECTIVE DATA Goal status: INITIAL   3.  Right Knee PROM 0* extension to 110* flexion Baseline: SEE OBJECTIVE DATA Goal status: INITIAL   4.  Right Knee AROM seated -3* extension to 100* flexion Baseline: SEE OBJECTIVE DATA Goal status: INITIAL   5.  Patient ambulates >500' community distances including negotiating ramps, curbs & stairs without device independently. Baseline: SEE OBJECTIVE DATA Goal status: INITIAL   6.  Patient reports ability to perform Yoga & walk her dogs. Baseline: SEE OBJECTIVE DATA Goal status: INITIAL   PLAN: PT FREQUENCY: 3x/week for 4 weeks then 2x/wk for 4 weeks.   PT DURATION: 8 weeks   PLANNED INTERVENTIONS: Therapeutic exercises, Therapeutic activity, Neuromuscular re-education, Balance training, Gait training, Patient/Family education, Joint mobilization, Stair training, Vestibular training, DME instructions, Electrical stimulation, Cryotherapy, Moist heat, scar mobilization, Taping, Vasopneumatic device, and Manual therapy   PLAN FOR NEXT SESSION: ROM focus  Elsie Ra, PT, DPT 06/07/21 3:44 PM

## 2021-06-08 ENCOUNTER — Encounter: Payer: Self-pay | Admitting: Orthopedic Surgery

## 2021-06-11 ENCOUNTER — Encounter: Payer: Self-pay | Admitting: Physical Therapy

## 2021-06-11 ENCOUNTER — Ambulatory Visit (INDEPENDENT_AMBULATORY_CARE_PROVIDER_SITE_OTHER): Payer: No Typology Code available for payment source | Admitting: Physical Therapy

## 2021-06-11 DIAGNOSIS — M25661 Stiffness of right knee, not elsewhere classified: Secondary | ICD-10-CM | POA: Diagnosis not present

## 2021-06-11 DIAGNOSIS — R600 Localized edema: Secondary | ICD-10-CM

## 2021-06-11 DIAGNOSIS — M25561 Pain in right knee: Secondary | ICD-10-CM | POA: Diagnosis not present

## 2021-06-11 DIAGNOSIS — R2689 Other abnormalities of gait and mobility: Secondary | ICD-10-CM

## 2021-06-11 DIAGNOSIS — M6281 Muscle weakness (generalized): Secondary | ICD-10-CM

## 2021-06-11 NOTE — Therapy (Signed)
OUTPATIENT PHYSICAL THERAPY TREATMENT NOTE   Patient Name: BEONCA GIBB MRN: [id number] DOB:[birth year], 51 y.o., female Today's Date: 06/11/2021   END OF SESSION:   PT End of Session - 06/11/21 1511     Visit Number 7    Number of Visits 20    Date for PT Re-Evaluation 07/20/21    Authorization Type U[employer]    Authorization Time Period [employer] CHOICE  NO VISIT LIMIT, PATIENT PAYS 100% UNTIL DED IS MET, DED $7500 [id number] MET, TERM DATE 09/19/2021    Progress Note Due on Visit 10    PT Start Time 1515    PT Stop Time 1600    PT Time Calculation (min) 45 min    Activity Tolerance Patient limited by pain    Behavior During Therapy [facility] for tasks assessed/performed                Past Medical History:  Diagnosis Date   Anemia    Depression    Menorrhagia    Mood disorder ([place])    Von Willebrand's disease ([place])    followed by hemotologist Dr. Marin Olp   Past Surgical History:  Procedure Laterality Date   APPENDECTOMY  01/22/2004   DILATION AND CURETTAGE OF UTERUS  2002   x 2 in 2002   DILITATION & CURRETTAGE/HYSTROSCOPY WITH HYDROTHERMAL ABLATION N/A 11/22/2014   Procedure: HYSTEROSCOPY WITH HYDROTHERMAL ABLATION;  Surgeon: Dian Queen, MD;  Location: [place];  Service: Gynecology;  Laterality: N/A;   HYSTEROSCOPY WITH D & C  08-08-2014  in [place]   MULTIPLE TOOTH EXTRACTIONS     TOTAL KNEE ARTHROPLASTY Right 05/22/2021   Procedure: RIGHT TOTAL KNEE ARTHROPLASTY;  Surgeon: Meredith Pel, MD;  Location: [place];  Service: Orthopedics;  Laterality: Right;   Patient Active Problem List   Diagnosis Date Noted   S/P knee replacement 05/22/2021   BMI 33.0-33.9,adult 06/16/2015   Von Willebrand disease ([place]) 06/13/2015   Depression 12/29/2014   Rhinitis, allergic 12/28/2014    PCP: Eunice Blase, MD   REFERRING PROVIDER: Donella Stade, PA-C   REFERRING DIAG: [phone] (ICD-10-CM) - History of total knee arthroplasty, unspecified  laterality  ONSET DATE: 05/22/2021 Right TKA   THERAPY DIAG:  Stiffness of right knee, not elsewhere classified  Acute pain of right knee  Localized edema  Muscle weakness (generalized)  Other abnormalities of gait and mobility  PERTINENT HISTORY: Von Willebrand disease, depression, Menorrhgia  PRECAUTIONS: None  SUBJECTIVE:  She relays the pain is 2/10 upon arrival but did take pain meds. She does have lots of complaints of stiffness  PAIN:  NPRS scale:  4-5/10 today and up to 9/10 Pain location: right knee all over.  Back of knee "kills" when laying elevated.  Pain description: sharp, sore, cramps Aggravating factors: fall Relieving factors: ice, meds   OBJECTIVE:    PATIENT SURVEYS:  05/28/2021 FOTO 29% functional & target 67%   COGNITION: 05/28/2021 Overall cognitive status: Within functional limits for tasks assessed                EDEMA: 05/28/2021 RLE above knee 51.8cm around knee 48cm below knee  41.2cm LLE above knee 45.8cm around knee 40cm below knee 34.1cm   PALPATION: 05/28/2021 Bruising along medial knee & lower leg, tenderness over joint line, quads, hamstrings   LE ROM:   ROM P: Passive  A: Active Right 05/28/2021 Right 05/31/2021 Right 06/05/21 Right 06/06/21 Right 06/11/21  Hip flexion  Hip extension        Hip abduction        Hip adduction        Hip internal rotation        Hip external rotation        Knee flexion Supine P: 64* Seated  A: 64* Supine AROM heel slide:61 PROM overpressure in heel slide : 66   Supine AA: 73* Seated  P: 75*   Knee extension Supine P: -10* Seated  A: -31* Seated AROM LAQ: -21 Seated LAQ A: -14* Supine P: -9* Supine A: -8  Ankle dorsiflexion        Ankle plantarflexion        Ankle inversion        Ankle eversion         (Blank rows = not tested)   LE MMT:   MMT Right 05/28/2021 Left 05/28/2021  Hip flexion      Hip extension      Hip abduction      Hip adduction      Hip internal rotation       Hip external rotation      Knee flexion 2/5    Knee extension 2/5    Ankle dorsiflexion      Ankle plantarflexion      Ankle inversion      Ankle eversion       (Blank rows = not tested)   GAIT: 05/28/2021 Distance walked: 64' Assistive device utilized:  4 footed walker level of assistance: SBA  verbal cues needed Comments: antalgic, right knee flexed in stance, limited to no increase flexion for swing, RLE abducted,    Functional Activities: Pt using LLE to move RLE in & out of bed.  PT min guard RLE and she was able to move RLE without assisting with LLE.    TODAY'S TREATMENT: 06/11/21 Ambulation 150 feet with SPC with supervision with quad tip cane and 150 feet with SPC with supervision, she preferred quad tip cane and PT recommended she get one to help transition from RW  Seated knee flexion stretch AAROM left on top of Rt 10 sec hold X 3 min  Seated LAQ 2# on Rt, with contralateral movement opposite, pause in end range flexion /extension x 3 min  Supine heel slides AAROM 5 sec hold at max flexion, then moving into quad set 5 sec hold into max extension X 3 min  Supine Quad sets 5 sec X 2 min  Supine hamstring stretch with strap 30 sec  X3  Seated low load long duration knee flexion stretch 3 min with blue band around Rt ankle that was anchored to 30#KB  Manual therapy: Rt knee PROM for flexion and extension to tolerance  06/07/21 Nu step X 10 min L5 with 5 sec holds at max ROM Leg Press BLEs 75# 10 reps 3 sets with 5 sec holds at max ROM Seated knee flexion stretch AAROM left on top of Rt 10 sec hold X 3 min Seated LAQ c contralateral movement opposite, pause in end range flexion /extension x 3 min Supine heel slides AAROM 5 sec hold at max flexion, then moving into quad set 5 sec hold into max extension X 3 min Manual therapy: Rt knee PROM for flexion and extension, Rt flexion mobs grade 2    06/06/2021 Therex:   Recumbent bike SciFit seat 9 rocking back/forth for  knee flexion stretch using BLEs & BUEs for 6 min  Leg Press BLEs  75# 10 reps 3 sets.     Supine with both feet on 55cm ball / heel slide AAROM strap c quad set into extension 5 sec & flexion 5 sec 15 reps  Neuromuscular: tandem stance on floor 30 sec RLE in front & in back   Therapeutic Activities: gait with RW with cues for rolling off toes for RLE knee flexion terminal stance & improved swing.  Self-Care:  Pt demo how she used pillow to position LEs in bed which was correct.   Manual:  PROM ext & flex during rests on Leg Press  Seated guided PROM c mild distraction, IR into flexion Rt knee and contract relax   Vasopneumatic:  Supine Rt knee 34 degrees, medium compression in elevation 10 mins  06/05/2021 Therex:   Nustep Lvl 5 seat 8 BLEs & BUEs 8 mins full motion  Leg Press BLEs 75# 10 reps 3 sets.   Seated LAQ c contralateral movement opposite, pause in end range flexion /extension x 10 reps  Supine with both feet on 55cm ball / heel slide AAROM strap c quad set into extension 5 sec & flexion 5 sec 10 reps  Neuromuscular: tandem stance on floor 30 sec RLE in front & in back   Therapeutic Activities: pre-gait at counter rolling off toes for RLE knee flexion terminal stance, wt shift onto RLE initial contact & terminal stance.  Carryover in gait with RW.   Self-Care:  PT instructed in positioning in bed with pillows to "tent" sheets off feet and pillow bw LEs in sidelying. Pt verbalized understanding.  Manual:  PROM ext & flex during rests on Leg Press  Seated guided PROM c mild distraction, IR into flexion Rt knee.   Vasopneumatic:  Supine Rt knee 34 degrees, medium compression in elevation 10 mins  05/31/2021 Therex:   Nustep Lvl 5 UE/LE 8 mins  Seated LAQ c contralateral movement opposite, pause in end range flexion /extension x 10  Supine heel slide AROM c quad set into extension combo x 8  Standing church pew ankle strategy weight shifting 3 x 30 secs  Retro step Rt leg  posterior x 15  Incline calf stretch bilateral 30 sec x 3 c cues for knee extension  Manual:  Seated guided PROM c mild distraction, IR into flexion Rt knee.   Vasopneumatic:  Supine Rt knee 34 degrees, medium compression in elevation 10 mins    PATIENT EDUCATION:  Education details: Access Code: [email or web address] Person educated: Patient and Spouse Education method: Explanation, Demonstration, Tactile cues, Verbal cues, and Handouts Education comprehension: verbalized understanding, returned demonstration, verbal cues required, tactile cues required, and needs further education     HOME EXERCISE PROGRAM: Access Code: [email or web address] URL: [email or web address] Date: 05/28/2021 Prepared by: Jamey Reas   Exercises - Quad Setting and Stretching  - 2-4 x daily - 7 x weekly - 5-10 sets - 10 reps - prop 5-10 minutes & quad set5 seconds hold - Ankle Alphabet in Elevation  - 2-4 x daily - 7 x weekly - 1 sets - 1 reps - Supine Heel Slide with Strap  - 2-3 x daily - 7 x weekly - 2-3 sets - 10 reps - 5 seconds hold - Supine Heel Slide with Small Ball  - 2-3 x daily - 7 x weekly - 2-3 sets - 10 reps - 5 seconds hold - Supine Straight Leg Raises  - 2-3 x daily - 7 x weekly - 2-3 sets - 10 reps - 5 seconds hold -  Seated Knee Flexion Extension AROM   - 2-4 x daily - 7 x weekly - 2-3 sets - 10 reps - 5 seconds hold - Seated Knee Flexion AAROM  - 1 x daily - 5 x weekly - 1 sets - 10 reps - 5 seconds hold - Seated straight leg lifts  - 2-3 x daily - 7 x weekly - 2-3 sets - 10 reps - 5 seconds hold - Seated Hamstring Stretch with Strap  - 2-4 x daily - 7 x weekly - 1 sets - 3 reps - 20-30 seconds hold   ASSESSMENT:   CLINICAL IMPRESSION: Continued with ROM focus as this is her biggest deficit. Also worked today with ambulation with SPC to transition her to [place] as able. She will continue to benefit from PT to improve ROM and strength in her Rt knee.   OBJECTIVE IMPAIRMENTS Abnormal gait, decreased  activity tolerance, decreased balance, decreased knowledge of condition, decreased knowledge of use of DME, decreased mobility, difficulty walking, decreased ROM, decreased strength, increased edema, increased muscle spasms, impaired flexibility, postural dysfunction, and pain.    ACTIVITY LIMITATIONS community activity, driving, and personal recreation like Yoga & walking dogs .    PERSONAL FACTORS 1-2 comorbidities: see PMH  are also affecting patient's functional outcome.    REHAB POTENTIAL: Good   CLINICAL DECISION MAKING: Stable/uncomplicated   EVALUATION COMPLEXITY: Low     GOALS: Goals reviewed with patient? Yes   SHORT TERM GOALS: Target date: 06/22/2021   Patient independent and verbalizes compliance with initial HEP Baseline: SEE OBJECTIVE DATA Goal status: on going 05/29/2021   2.  Patient reports 50% improvement in right knee pain. Baseline: SEE OBJECTIVE DATA Goal status: on going 05/29/2021   3.  PROM right knee extension -3* to flexion 90* Baseline: SEE OBJECTIVE DATA Goal status: on going 05/29/2021   LONG TERM GOALS: Target date: 07/20/2021   Patient will improve FOTO score to 67% Baseline: SEE OBJECTIVE DATA Goal status: INITIAL   2.  Patient reports right knee pain </= 2/10 with standing & gait activities.  Baseline: SEE OBJECTIVE DATA Goal status: INITIAL   3.  Right Knee PROM 0* extension to 110* flexion Baseline: SEE OBJECTIVE DATA Goal status: INITIAL   4.  Right Knee AROM seated -3* extension to 100* flexion Baseline: SEE OBJECTIVE DATA Goal status: INITIAL   5.  Patient ambulates >500' community distances including negotiating ramps, curbs & stairs without device independently. Baseline: SEE OBJECTIVE DATA Goal status: INITIAL   6.  Patient reports ability to perform Yoga & walk her dogs. Baseline: SEE OBJECTIVE DATA Goal status: INITIAL   PLAN: PT FREQUENCY: 3x/week for 4 weeks then 2x/wk for 4 weeks.   PT DURATION: 8 weeks   PLANNED  INTERVENTIONS: Therapeutic exercises, Therapeutic activity, Neuromuscular re-education, Balance training, Gait training, Patient/Family education, Joint mobilization, Stair training, Vestibular training, DME instructions, Electrical stimulation, Cryotherapy, Moist heat, scar mobilization, Taping, Vasopneumatic device, and Manual therapy   PLAN FOR NEXT SESSION: ROM focus  Elsie Ra, PT, DPT 06/11/21 3:12 PM

## 2021-06-12 ENCOUNTER — Other Ambulatory Visit: Payer: Self-pay | Admitting: Surgical

## 2021-06-12 MED ORDER — OXYCODONE HCL 5 MG PO TABS: 5.0000 mg | ORAL_TABLET | Freq: Three times a day (TID) | ORAL | 0 refills | Status: DC | PRN

## 2021-06-13 ENCOUNTER — Encounter: Payer: Self-pay | Admitting: Physical Therapy

## 2021-06-13 ENCOUNTER — Ambulatory Visit (INDEPENDENT_AMBULATORY_CARE_PROVIDER_SITE_OTHER): Payer: No Typology Code available for payment source | Admitting: Physical Therapy

## 2021-06-13 DIAGNOSIS — M25561 Pain in right knee: Secondary | ICD-10-CM | POA: Diagnosis not present

## 2021-06-13 DIAGNOSIS — M6281 Muscle weakness (generalized): Secondary | ICD-10-CM

## 2021-06-13 DIAGNOSIS — M25661 Stiffness of right knee, not elsewhere classified: Secondary | ICD-10-CM

## 2021-06-13 DIAGNOSIS — R600 Localized edema: Secondary | ICD-10-CM | POA: Diagnosis not present

## 2021-06-13 DIAGNOSIS — R2689 Other abnormalities of gait and mobility: Secondary | ICD-10-CM

## 2021-06-13 DIAGNOSIS — R2681 Unsteadiness on feet: Secondary | ICD-10-CM

## 2021-06-13 NOTE — Therapy (Signed)
OUTPATIENT PHYSICAL THERAPY TREATMENT NOTE   Patient Name: Cheryl Tate MRN: [id number] DOB:March 03, 1970, 51 y.o., female Today's Date: 06/13/2021   END OF SESSION:   PT End of Session - 06/13/21 1521     Visit Number 8    Number of Visits 20    Date for PT Re-Evaluation 07/20/21    Authorization Type U[employer]    Authorization Time Period [facility] CHOICE  NO VISIT LIMIT, PATIENT PAYS 100% UNTIL DED IS MET, DED $7500 [id number] MET, TERM DATE 09/19/2021    Progress Note Due on Visit 10    PT Start Time 1515    PT Stop Time 1615    PT Time Calculation (min) 60 min    Activity Tolerance Patient limited by pain    Behavior During Therapy [facility] for tasks assessed/performed                 Past Medical History:  Diagnosis Date   Anemia    Depression    Menorrhagia    Mood disorder ([place])    Von Willebrand's disease ([place])    followed by hemotologist Dr. Marin Olp   Past Surgical History:  Procedure Laterality Date   APPENDECTOMY  01/22/2004   DILATION AND CURETTAGE OF UTERUS  2002   x 2 in 2002   DILITATION & CURRETTAGE/HYSTROSCOPY WITH HYDROTHERMAL ABLATION N/A 11/22/2014   Procedure: HYSTEROSCOPY WITH HYDROTHERMAL ABLATION;  Surgeon: Dian Queen, MD;  Location: [place];  Service: Gynecology;  Laterality: N/A;   HYSTEROSCOPY WITH D & C  08-08-2014  in [place]   MULTIPLE TOOTH EXTRACTIONS     TOTAL KNEE ARTHROPLASTY Right 05/22/2021   Procedure: RIGHT TOTAL KNEE ARTHROPLASTY;  Surgeon: Meredith Pel, MD;  Location: [place];  Service: Orthopedics;  Laterality: Right;   Patient Active Problem List   Diagnosis Date Noted   S/P knee replacement 05/22/2021   BMI 33.0-33.9,adult 06/16/2015   Von Willebrand disease ([place]) 06/13/2015   Depression 12/29/2014   Rhinitis, allergic 12/28/2014    PCP: Eunice Blase, MD   REFERRING PROVIDER: Donella Stade, PA-C   REFERRING DIAG: [phone] (ICD-10-CM) - History of total knee arthroplasty, unspecified  laterality  ONSET DATE: 05/22/2021 Right TKA   THERAPY DIAG:  Stiffness of right knee, not elsewhere classified  Acute pain of right knee  Localized edema  Muscle weakness (generalized)  Other abnormalities of gait and mobility  Unsteadiness on feet  PERTINENT HISTORY: Von Willebrand disease, depression, Menorrhgia  PRECAUTIONS: None  SUBJECTIVE:  She is sleeping better. She has decreased oxy to every 6 hours and taking ibuprofen in middle.   PAIN:  NPRS scale:  4/10 today and ranging 2/10 to 7/10 Pain location: right knee all over.  Pain description: sharp, sore, cramps Aggravating factors: fall Relieving factors: ice, meds   OBJECTIVE:    PATIENT SURVEYS:  05/28/2021 FOTO 29% functional & target 67%   COGNITION: 05/28/2021 Overall cognitive status: Within functional limits for tasks assessed                EDEMA: 05/28/2021 RLE above knee 51.8cm around knee 48cm below knee  41.2cm LLE above knee 45.8cm around knee 40cm below knee 34.1cm   PALPATION: 05/28/2021 Bruising along medial knee & lower leg, tenderness over joint line, quads, hamstrings   LE ROM:   ROM P: Passive  A: Active Right 05/28/2021 Right 05/31/2021 Right 06/05/21 Right 06/06/21 Right 06/11/21  Hip flexion        Hip extension  Hip abduction        Hip adduction        Hip internal rotation        Hip external rotation        Knee flexion Supine P: 64* Seated  A: 64* Supine AROM heel slide:61 PROM overpressure in heel slide : 66   Supine AA: 73* Seated  P: 75*   Knee extension Supine P: -10* Seated  A: -31* Seated AROM LAQ: -21 Seated LAQ A: -14* Supine P: -9* Supine A: -8  Ankle dorsiflexion        Ankle plantarflexion        Ankle inversion        Ankle eversion         (Blank rows = not tested)   LE MMT:   MMT Right 05/28/2021 Left 05/28/2021  Hip flexion      Hip extension      Hip abduction      Hip adduction      Hip internal rotation      Hip external rotation       Knee flexion 2/5    Knee extension 2/5    Ankle dorsiflexion      Ankle plantarflexion      Ankle inversion      Ankle eversion       (Blank rows = not tested)   GAIT: 05/28/2021 Distance walked: 49' Assistive device utilized:  4 footed walker level of assistance: SBA  verbal cues needed Comments: antalgic, right knee flexed in stance, limited to no increase flexion for swing, RLE abducted,    Functional Activities: Pt using LLE to move RLE in & out of bed.  PT min guard RLE and she was able to move RLE without assisting with LLE.    TODAY'S TREATMENT: 06/13/2021 Therapeutic Exercise:  Aerobic: recumbent bike SciFit 7 rocking back & forth for flexion stretch for 8 min  Machines for strength: Leg Press BLEs 87# 30 reps with 5 sec holds at max ROM. RLE only 37# 15 reps 2 sets.  Supine: Prone:  Seated: LAQ 2# and active knee flexion with LLE opposing motion 20 reps.   Standing: Pre-gait stepping RLE over 2" box 10" long rolling over toes for knee flexion and wt shift onto RLE in initial contact & terminal stance   Step up & down 6" step in //bars BUE support 10 reps. PT demo & verbal cues on technique.  Neuromuscular Re-education: tandem stance on foam 30 sec RLE in front & in back 2 reps ea.  Manual Therapy: Rt knee PROM for flexion and extension to tolerance and contract relax Therapeutic Activity:  PT recommended sitting behind wheel of car in park but engine running. Work on moving RLE from gas to brake depressing.  Work on increase fluency of motion & endurance.  She should move the seat back to get in & out. Pt verbalized understanding.  Self Care: PT recommended elevating LEs higher than heart >/= 15 min >/= 2x/day and using muscles like alphabet or quad sets. Pt verbalized understanding.  PT recommended sitting with RLE either ext with heel on floor or knee flex to tolerance with goal foot under knee. Pt verbalized understanding.  Trigger Point Dry Needling:  Modalities: vaso rt  knee high compression 34* 10 min with elevation and muscle activity (quad sets & alphabet)    06/11/21 Ambulation 150 feet with SPC with supervision with quad tip cane and 150 feet with SPC with  supervision, she preferred quad tip cane and PT recommended she get one to help transition from RW  Seated knee flexion stretch AAROM left on top of Rt 10 sec hold X 3 min  Seated LAQ 2# on Rt, with contralateral movement opposite, pause in end range flexion /extension x 3 min  Supine heel slides AAROM 5 sec hold at max flexion, then moving into quad set 5 sec hold into max extension X 3 min  Supine Quad sets 5 sec X 2 min  Supine hamstring stretch with strap 30 sec  X3  Seated low load long duration knee flexion stretch 3 min with blue band around Rt ankle that was anchored to 30#KB  Manual therapy: Rt knee PROM for flexion and extension to tolerance  06/07/21 Nu step X 10 min L5 with 5 sec holds at max ROM Leg Press BLEs 75# 10 reps 3 sets with 5 sec holds at max ROM Seated knee flexion stretch AAROM left on top of Rt 10 sec hold X 3 min Seated LAQ c contralateral movement opposite, pause in end range flexion /extension x 3 min Supine heel slides AAROM 5 sec hold at max flexion, then moving into quad set 5 sec hold into max extension X 3 min Manual therapy: Rt knee PROM for flexion and extension, Rt flexion mobs grade 2     HOME EXERCISE PROGRAM: Access Code: [email or web address] URL: [email or web address] Date: 05/28/2021 Prepared by: Jamey Reas   Exercises - Quad Setting and Stretching  - 2-4 x daily - 7 x weekly - 5-10 sets - 10 reps - prop 5-10 minutes & quad set5 seconds hold - Ankle Alphabet in Elevation  - 2-4 x daily - 7 x weekly - 1 sets - 1 reps - Supine Heel Slide with Strap  - 2-3 x daily - 7 x weekly - 2-3 sets - 10 reps - 5 seconds hold - Supine Heel Slide with Small Ball  - 2-3 x daily - 7 x weekly - 2-3 sets - 10 reps - 5 seconds hold - Supine Straight Leg Raises   - 2-3 x daily - 7 x weekly - 2-3 sets - 10 reps - 5 seconds hold - Seated Knee Flexion Extension AROM   - 2-4 x daily - 7 x weekly - 2-3 sets - 10 reps - 5 seconds hold - Seated Knee Flexion AAROM  - 1 x daily - 5 x weekly - 1 sets - 10 reps - 5 seconds hold - Seated straight leg lifts  - 2-3 x daily - 7 x weekly - 2-3 sets - 10 reps - 5 seconds hold - Seated Hamstring Stretch with Strap  - 2-4 x daily - 7 x weekly - 1 sets - 3 reps - 20-30 seconds hold   ASSESSMENT:   CLINICAL IMPRESSION: Patient's pain seems to be decreasing. She is tolerating progressive activities. Pt continues to benefit from skilled PT to improve mobility s/p TKR.   OBJECTIVE IMPAIRMENTS Abnormal gait, decreased activity tolerance, decreased balance, decreased knowledge of condition, decreased knowledge of use of DME, decreased mobility, difficulty walking, decreased ROM, decreased strength, increased edema, increased muscle spasms, impaired flexibility, postural dysfunction, and pain.    ACTIVITY LIMITATIONS community activity, driving, and personal recreation like Yoga & walking dogs .    PERSONAL FACTORS 1-2 comorbidities: see PMH  are also affecting patient's functional outcome.    REHAB POTENTIAL: Good   CLINICAL DECISION MAKING: Stable/uncomplicated   EVALUATION COMPLEXITY: Low     GOALS:  Goals reviewed with patient? Yes   SHORT TERM GOALS: Target date: 06/22/2021   Patient independent and verbalizes compliance with initial HEP Baseline: SEE OBJECTIVE DATA Goal status: on going 05/29/2021   2.  Patient reports 50% improvement in right knee pain. Baseline: SEE OBJECTIVE DATA Goal status: on going 05/29/2021   3.  PROM right knee extension -3* to flexion 90* Baseline: SEE OBJECTIVE DATA Goal status: on going 05/29/2021   LONG TERM GOALS: Target date: 07/20/2021   Patient will improve FOTO score to 67% Baseline: SEE OBJECTIVE DATA Goal status: INITIAL   2.  Patient reports right knee pain </= 2/10 with  standing & gait activities.  Baseline: SEE OBJECTIVE DATA Goal status: INITIAL   3.  Right Knee PROM 0* extension to 110* flexion Baseline: SEE OBJECTIVE DATA Goal status: INITIAL   4.  Right Knee AROM seated -3* extension to 100* flexion Baseline: SEE OBJECTIVE DATA Goal status: INITIAL   5.  Patient ambulates >500' community distances including negotiating ramps, curbs & stairs without device independently. Baseline: SEE OBJECTIVE DATA Goal status: INITIAL   6.  Patient reports ability to perform Yoga & walk her dogs. Baseline: SEE OBJECTIVE DATA Goal status: INITIAL   PLAN: PT FREQUENCY: 3x/week for 4 weeks then 2x/wk for 4 weeks.   PT DURATION: 8 weeks   PLANNED INTERVENTIONS: Therapeutic exercises, Therapeutic activity, Neuromuscular re-education, Balance training, Gait training, Patient/Family education, Joint mobilization, Stair training, Vestibular training, DME instructions, Electrical stimulation, Cryotherapy, Moist heat, scar mobilization, Taping, Vasopneumatic device, and Manual therapy   PLAN FOR NEXT SESSION: manual therapy, therapeutic exercise progressing range, vaso to end  Jamey Reas, PT, DPT 06/13/21 4:12 PM

## 2021-06-14 ENCOUNTER — Encounter: Payer: Self-pay | Admitting: Physical Therapy

## 2021-06-14 ENCOUNTER — Ambulatory Visit (INDEPENDENT_AMBULATORY_CARE_PROVIDER_SITE_OTHER): Payer: No Typology Code available for payment source | Admitting: Physical Therapy

## 2021-06-14 ENCOUNTER — Telehealth: Payer: Self-pay | Admitting: Orthopedic Surgery

## 2021-06-14 DIAGNOSIS — M6281 Muscle weakness (generalized): Secondary | ICD-10-CM

## 2021-06-14 DIAGNOSIS — M25561 Pain in right knee: Secondary | ICD-10-CM | POA: Diagnosis not present

## 2021-06-14 DIAGNOSIS — R600 Localized edema: Secondary | ICD-10-CM

## 2021-06-14 DIAGNOSIS — M25661 Stiffness of right knee, not elsewhere classified: Secondary | ICD-10-CM | POA: Diagnosis not present

## 2021-06-14 DIAGNOSIS — R2681 Unsteadiness on feet: Secondary | ICD-10-CM

## 2021-06-14 DIAGNOSIS — R2689 Other abnormalities of gait and mobility: Secondary | ICD-10-CM

## 2021-06-14 NOTE — Discharge Summary (Signed)
Physician Discharge Summary      Patient ID: Cheryl Tate MRN: [id number] DOB/AGE: 09/17/1970 51 y.o.  Admit date: 05/22/2021 Discharge date: 05/25/21  Admission Diagnoses:  Principal Problem:   S/P knee replacement   Discharge Diagnoses:  Same  Surgeries: Procedure(s): RIGHT TOTAL KNEE ARTHROPLASTY on 05/22/2021   Consultants:   Discharged Condition: Stable  Hospital Course: Cheryl Tate is an 51 y.o. female who was admitted 05/22/2021 with a chief complaint of right knee pain, and found to have a diagnosis of right knee osteoarthritis.  They were brought to the operating room on 05/22/2021 and underwent the above named procedures.  Pt awoke from anesthesia without complication and was transferred to the floor. On POD1, patient was experiencing some new onset low back pain but overall her postop pain was controlled.  Back pain improved by POD 2.  She was slow to mobilize with physical therapy but was able to progress steadily to the point that she was able to ambulate greater than 100 feet and do stair training on POD 3.  She was discharged home on POD 3..  Pt will f/u with Dr. Marlou Sa in clinic in ~2 weeks.   Antibiotics given:  Anti-infectives (From admission, onward)    Start     Dose/Rate Route Frequency Ordered Stop   05/22/21 1400  ceFAZolin (ANCEF) IVPB 1 g/50 mL premix        1 g 100 mL/hr over 30 Minutes Intravenous Every 8 hours 05/22/21 1249 05/22/21 2251   05/22/21 0736  vancomycin (VANCOCIN) powder  Status:  Discontinued          As needed 05/22/21 0736 05/22/21 1047   05/22/21 0600  ceFAZolin (ANCEF) IVPB 2g/100 mL premix        2 g 200 mL/hr over 30 Minutes Intravenous On call to O.R. 05/22/21 9622 05/22/21 0810     .  Recent vital signs:  Vitals:   05/25/21 0519 05/25/21 0812  BP: 108/68 106/68  Pulse: 80 84  Resp:  17  Temp:  98.5 F (36.9 C)  SpO2: 99% 99%    Recent laboratory studies:  Results for orders placed or performed during the  hospital encounter of 05/22/21  CBC  Result Value Ref Range   WBC 10.7 (H) 4.0 - 10.5 K/uL   RBC 2.99 (L) 3.87 - 5.11 MIL/uL   Hemoglobin 9.3 (L) 12.0 - 15.0 g/dL   HCT 26.8 (L) 36.0 - 46.0 %   MCV 89.6 80.0 - 100.0 fL   MCH 31.1 26.0 - 34.0 pg   MCHC 34.7 30.0 - 36.0 g/dL   RDW 11.9 11.5 - 15.5 %   Platelets 233 150 - 400 K/uL   nRBC 0.0 0.0 - 0.2 %  CBC  Result Value Ref Range   WBC 9.0 4.0 - 10.5 K/uL   RBC 3.30 (L) 3.87 - 5.11 MIL/uL   Hemoglobin 10.1 (L) 12.0 - 15.0 g/dL   HCT 28.5 (L) 36.0 - 46.0 %   MCV 86.4 80.0 - 100.0 fL   MCH 30.6 26.0 - 34.0 pg   MCHC 35.4 30.0 - 36.0 g/dL   RDW 11.8 11.5 - 15.5 %   Platelets 265 150 - 400 K/uL   nRBC 0.0 0.0 - 0.2 %  Pregnancy, urine POC  Result Value Ref Range   Preg Test, Ur NEGATIVE NEGATIVE  Type and screen [place]  Result Value Ref Range   ABO/RH(D) O POS    Antibody Screen NEG  Sample Expiration      05/25/2021,2359 Performed at [facility], [place] [address] [place], [place] [address]     Discharge Medications:   Allergies as of 05/25/2021       Reactions   Bee Venom Swelling   Nsaids    Avoid due to von willebrand disease         Medication List     TAKE these medications    acetaminophen 500 MG tablet Commonly known as: TYLENOL Take 1 tablet (500 mg total) by mouth every 6 (six) hours as needed for moderate pain. What changed: how much to take   B12 FOLATE PO Take 1 tablet by mouth daily.   CALCIUM PO Take 1 tablet by mouth daily at 6 (six) AM. Raw calcium with other vitamins   [employer] TRIPLE MAGNESIUM COMPLEX PO Take 1 tablet by mouth at bedtime.   EPINEPHrine 0.3 mg/0.3 mL Soaj injection Commonly known as: EPI-PEN Inject 0.3 mg into the muscle as needed for anaphylaxis.   estradiol 0.5 MG tablet Commonly known as: ESTRACE Take 0.5 mg by mouth daily.   fluticasone 50 MCG/ACT nasal spray Commonly known as: FLONASE Place 1 spray into both nostrils daily as needed  for allergies or rhinitis.   FOLIC ACID PO Take 1 capsule by mouth daily.   gabapentin 300 MG capsule Commonly known as: NEURONTIN Take 1 capsule (300 mg total) by mouth 2 (two) times daily.   GLUCOSAMINE 1500 COMPLEX PO Take 1,500 mg by mouth daily.   IRON PO Take 1 tablet by mouth daily at 6 (six) AM. Hemangenics Iron   levocetirizine 5 MG tablet Commonly known as: XYZAL Take 5 mg by mouth daily as needed for allergies.   methocarbamol 500 MG tablet Commonly known as: ROBAXIN Take 1 tablet (500 mg total) by mouth every 8 (eight) hours as needed for muscle spasms.   ondansetron 4 MG tablet Commonly known as: ZOFRAN Take 1 tablet (4 mg total) by mouth every 6 (six) hours as needed for nausea.   progesterone 200 MG capsule Commonly known as: PROMETRIUM Take 200 mg by mouth at bedtime.   TURMERIC PO Take 1,000 mg by mouth daily.   valproic acid 250 MG capsule Commonly known as: DEPAKENE Take 250 mg by mouth at bedtime.   VISINE-A OP Place 1 drop into both eyes daily as needed (allergies).   vitamin C 1000 MG tablet Take 1,000 mg by mouth daily.   Vitamin D3 125 MCG (5000 UT) Caps TAKE ONE CAPSULE BY MOUTH DAILY        Diagnostic Studies: CT Angio Chest Pulmonary Embolism (PE) W or WO Contrast  Result Date: 06/01/2021 CLINICAL DATA:  Shortness of breath, elevated D-dimer, prior RIGHT knee replacement on 05/22/2021 EXAM: CT ANGIOGRAPHY CHEST WITH CONTRAST TECHNIQUE: Multidetector CT imaging of the chest was performed using the standard protocol during bolus administration of intravenous contrast. Multiplanar CT image reconstructions and MIPs were obtained to evaluate the vascular anatomy. RADIATION DOSE REDUCTION: This exam was performed according to the departmental dose-optimization program which includes automated exposure control, adjustment of the mA and/or kV according to patient size and/or use of iterative reconstruction technique. CONTRAST:  66m ISOVUE-370  IOPAMIDOL (ISOVUE-370) INJECTION 76% IV COMPARISON:  None available FINDINGS: Cardiovascular: Aorta normal caliber without aneurysm or dissection. Heart size normal. No pericardial effusion. Pulmonary arteries well opacified and patent. No evidence of pulmonary embolism. Mediastinum/Nodes: Esophagus unremarkable. Base of cervical region normal appearance. No thoracic adenopathy. Lungs/Pleura: Lungs clear.  No pulmonary infiltrate, pleural effusion, or pneumothorax. Upper Abdomen: 24 mm cyst anteriorly LEFT lobe liver. 17 x 15 mm LEFT adrenal nodule, measuring 20 HU, probable benign adenoma. Remaining visualized upper abdomen unremarkable. Musculoskeletal: Normal appearance no acute intrathoracic Review of the MIP images confirms the above findings. IMPRESSION: No evidence of pulmonary embolism. 1.7 cm left adrenal mass, probable benign adenoma. Recommend 1 year follow up adrenal washout CT. If stable for > 1 year, no further f/u imaging. JACR 2017 Aug; 14(8):1038-44, JCAT 2016 Mar-Apr; 40(2):194-200, Urol J 2006 Spring; 3(2):71-4. No acute intrathoracic abnormalities. Electronically Signed   By: Lavonia Dana M.D.   On: 06/01/2021 13:50   XR KNEE 3 VIEW RIGHT  Result Date: 05/30/2021 AP, lateral views of right knee reviewed.  Right knee total knee prosthesis in good position alignment without any complicating features.  No periprosthetic fracture noted.  No evidence of loosening.  VAS [place] LOWER EXTREMITY VENOUS (DVT)  Result Date: 05/25/2021  Lower Venous DVT Study Patient Name:  Cheryl Tate  Date of Exam:   05/25/2021 Medical Rec #: [id number]               Accession #:    [id number] Date of Birth: 1971/01/21                Patient Gender: F Patient Age:   89 years Exam Location:  [facility] Procedure:      VAS [place] LOWER EXTREMITY VENOUS (DVT) Referring Phys: Gloriann Loan --------------------------------------------------------------------------------  Indications: Swelling s/p RT total knee  arthroplasty.  Comparison Study: No prior studies. Performing Technologist: Darlin Coco RDMS, RVT  Examination Guidelines: A complete evaluation includes B-mode imaging, spectral Doppler, color Doppler, and power Doppler as needed of all accessible portions of each vessel. Bilateral testing is considered an integral part of a complete examination. Limited examinations for reoccurring indications may be performed as noted. The reflux portion of the exam is performed with the patient in reverse Trendelenburg.  +---------+---------------+---------+-----------+----------+--------------+ RIGHT    CompressibilityPhasicitySpontaneityPropertiesThrombus Aging +---------+---------------+---------+-----------+----------+--------------+ CFV      Full           Yes      Yes                                 +---------+---------------+---------+-----------+----------+--------------+ SFJ      Full                                                        +---------+---------------+---------+-----------+----------+--------------+ FV Prox  Full                                                        +---------+---------------+---------+-----------+----------+--------------+ FV Mid   Full                                                        +---------+---------------+---------+-----------+----------+--------------+ FV DistalFull                                                        +---------+---------------+---------+-----------+----------+--------------+  PFV      Full                                                        +---------+---------------+---------+-----------+----------+--------------+ POP      Full           Yes      Yes                                 +---------+---------------+---------+-----------+----------+--------------+ PTV      Full                                                         +---------+---------------+---------+-----------+----------+--------------+ PERO     Full                                                        +---------+---------------+---------+-----------+----------+--------------+ Gastroc  Full                                                        +---------+---------------+---------+-----------+----------+--------------+   +---------+---------------+---------+-----------+----------+--------------+ LEFT     CompressibilityPhasicitySpontaneityPropertiesThrombus Aging +---------+---------------+---------+-----------+----------+--------------+ CFV      Full           Yes      Yes                                 +---------+---------------+---------+-----------+----------+--------------+ SFJ      Full                                                        +---------+---------------+---------+-----------+----------+--------------+ FV Prox  Full                                                        +---------+---------------+---------+-----------+----------+--------------+ FV Mid   Full                                                        +---------+---------------+---------+-----------+----------+--------------+ FV DistalFull                                                        +---------+---------------+---------+-----------+----------+--------------+  PFV      Full                                                        +---------+---------------+---------+-----------+----------+--------------+ POP      Full           Yes      Yes                                 +---------+---------------+---------+-----------+----------+--------------+ PTV      Full                                                        +---------+---------------+---------+-----------+----------+--------------+ PERO     Full                                                         +---------+---------------+---------+-----------+----------+--------------+ Gastroc  Full                                                        +---------+---------------+---------+-----------+----------+--------------+     Summary: RIGHT: - There is no evidence of deep vein thrombosis in the lower extremity.  - No cystic structure found in the popliteal fossa.  LEFT: - There is no evidence of deep vein thrombosis in the lower extremity.  - No cystic structure found in the popliteal fossa.  *See table(s) above for measurements and observations. Electronically signed by Harold Barban MD on 05/25/2021 at 8:17:00 PM.    Final     Disposition: Discharge disposition: 01-Home or Self Care       Discharge Instructions     Call MD / Call 911   Complete by: As directed    If you experience chest pain or shortness of breath, CALL 911 and be transported to the hospital emergency room.  If you develope a fever above 101 F, pus (white drainage) or increased drainage or redness at the wound, or calf pain, call your surgeon's office.   Constipation Prevention   Complete by: As directed    Drink plenty of fluids.  Prune juice may be helpful.  You may use a stool softener, such as Colace (over the counter) 100 mg twice a day.  Use MiraLax (over the counter) for constipation as needed.   Diet - low sodium heart healthy   Complete by: As directed    Discharge instructions   Complete by: As directed    You may shower, dressing is waterproof.  Do not remove the dressing, we will remove it at your first post-op appointment.  Do not take a bath or soak the knee in a tub or pool.  You may weightbear as you can tolerate on the operative leg with a walker.  Continue using the CPM  machine 3 times per day for one hour each time, increasing the degrees of range of motion daily.  Use the blue cradle boot under your heel to work on getting your leg straight.  Do NOT put a pillow under your knee.  You will follow-up with  Dr. Marlou Sa in the clinic in 2 weeks at your given appointment date.  Look out for signs/symptoms of blood clot formation such as increased calf pain/swelling or chest pain or shortness of breath.    INSTRUCTIONS AFTER JOINT REPLACEMENT   Remove items at home which could result in a fall. This includes throw rugs or furniture in walking pathways ICE to the affected joint every three hours while awake for 30 minutes at a time, for at least the first 3-5 days, and then as needed for pain and swelling.  Continue to use ice for pain and swelling. You may notice swelling that will progress down to the foot and ankle.  This is normal after surgery.  Elevate your leg when you are not up walking on it.   Continue to use the breathing machine you got in the hospital (incentive spirometer) which will help keep your temperature down.  It is common for your temperature to cycle up and down following surgery, especially at night when you are not up moving around and exerting yourself.  The breathing machine keeps your lungs expanded and your temperature down.   DIET:  As you were doing prior to hospitalization, we recommend a well-balanced diet.  DRESSING / WOUND CARE / SHOWERING  Keep the surgical dressing until follow up.  The dressing is water proof, so you can shower without any extra covering.  IF THE DRESSING FALLS OFF or the wound gets wet inside, change the dressing with sterile gauze.  Please use good hand washing techniques before changing the dressing.  Do not use any lotions or creams on the incision until instructed by your surgeon.    ACTIVITY  Increase activity slowly as tolerated, but follow the weight bearing instructions below.   No driving for 6 weeks or until further direction given by your physician.  You cannot drive while taking narcotics.  No lifting or carrying greater than 10 lbs. until further directed by your surgeon. Avoid periods of inactivity such as sitting longer than an hour  when not asleep. This helps prevent blood clots.  You may return to work once you are authorized by your doctor.     WEIGHT BEARING   Weight bearing as tolerated with assist device (walker, cane, etc) as directed, use it as long as suggested by your surgeon or therapist, typically at least 4-6 weeks.   EXERCISES  Results after joint replacement surgery are often greatly improved when you follow the exercise, range of motion and muscle strengthening exercises prescribed by your doctor. Safety measures are also important to protect the joint from further injury. Any time any of these exercises cause you to have increased pain or swelling, decrease what you are doing until you are comfortable again and then slowly increase them. If you have problems or questions, call your caregiver or physical therapist for advice.   Rehabilitation is important following a joint replacement. After just a few days of immobilization, the muscles of the leg can become weakened and shrink (atrophy).  These exercises are designed to build up the tone and strength of the thigh and leg muscles and to improve motion. Often times heat used for twenty to thirty minutes before working  out will loosen up your tissues and help with improving the range of motion but do not use heat for the first two weeks following surgery (sometimes heat can increase post-operative swelling).   These exercises can be done on a training (exercise) mat, on the floor, on a table or on a bed. Use whatever works the best and is most comfortable for you.    Use music or television while you are exercising so that the exercises are a pleasant break in your day. This will make your life better with the exercises acting as a break in your routine that you can look forward to.   Perform all exercises about fifteen times, three times per day or as directed.  You should exercise both the operative leg and the other leg as well.  Exercises include:   Quad  Sets - Tighten up the muscle on the front of the thigh (Quad) and hold for 5-10 seconds.   Straight Leg Raises - With your knee straight (if you were given a brace, keep it on), lift the leg to 60 degrees, hold for 3 seconds, and slowly lower the leg.  Perform this exercise against resistance later as your leg gets stronger.  Leg Slides: Lying on your back, slowly slide your foot toward your buttocks, bending your knee up off the floor (only go as far as is comfortable). Then slowly slide your foot back down until your leg is flat on the floor again.  Angel Wings: Lying on your back spread your legs to the side as far apart as you can without causing discomfort.  Hamstring Strength:  Lying on your back, push your heel against the floor with your leg straight by tightening up the muscles of your buttocks.  Repeat, but this time bend your knee to a comfortable angle, and push your heel against the floor.  You may put a pillow under the heel to make it more comfortable if necessary.   A rehabilitation program following joint replacement surgery can speed recovery and prevent re-injury in the future due to weakened muscles. Contact your doctor or a physical therapist for more information on knee rehabilitation.    CONSTIPATION  Constipation is defined medically as fewer than three stools per week and severe constipation as less than one stool per week.  Even if you have a regular bowel pattern at home, your normal regimen is likely to be disrupted due to multiple reasons following surgery.  Combination of anesthesia, postoperative narcotics, change in appetite and fluid intake all can affect your bowels.   YOU MUST use at least one of the following options; they are listed in order of increasing strength to get the job done.  They are all available over the counter, and you may need to use some, POSSIBLY even all of these options:    Drink plenty of fluids (prune juice may be helpful) and high fiber  foods Colace 100 mg by mouth twice a day  Senokot for constipation as directed and as needed Dulcolax (bisacodyl), take with full glass of water  Miralax (polyethylene glycol) once or twice a day as needed.  If you have tried all these things and are unable to have a bowel movement in the first 3-4 days after surgery call either your surgeon or your primary doctor.    If you experience loose stools or diarrhea, hold the medications until you stool forms back up.  If your symptoms do not get better within 1 week or  if they get worse, check with your doctor.  If you experience "the worst abdominal pain ever" or develop nausea or vomiting, please contact the office immediately for further recommendations for treatment.   ITCHING:  If you experience itching with your medications, try taking only a single pain pill, or even half a pain pill at a time.  You can also use Benadryl over the counter for itching or also to help with sleep.   TED HOSE STOCKINGS:  Use stockings on both legs until for at least 2 weeks or as directed by physician office. They may be removed at night for sleeping.  MEDICATIONS:  See your medication summary on the "After Visit Summary" that nursing will review with you.  You may have some home medications which will be placed on hold until you complete the course of blood thinner medication.  It is important for you to complete the blood thinner medication as prescribed.  PRECAUTIONS:  If you experience chest pain or shortness of breath - call 911 immediately for transfer to the hospital emergency department.   If you develop a fever greater that 101 F, purulent drainage from wound, increased redness or drainage from wound, foul odor from the wound/dressing, or calf pain - CONTACT YOUR SURGEON.                                                   FOLLOW-UP APPOINTMENTS:  If you do not already have a post-op appointment, please call the office for an appointment to be seen by your  surgeon.  Guidelines for how soon to be seen are listed in your "After Visit Summary", but are typically between 1-4 weeks after surgery.  OTHER INSTRUCTIONS:   Knee Replacement:  Do not place pillow under knee, focus on keeping the knee straight while resting. CPM instructions: 0-90 degrees, 2 hours in the morning, 2 hours in the afternoon, and 2 hours in the evening. Place foam block, curve side up under heel at all times except when in CPM or when walking.  DO NOT modify, tear, cut, or change the foam block in any way.  POST-OPERATIVE OPIOID TAPER INSTRUCTIONS: It is important to wean off of your opioid medication as soon as possible. If you do not need pain medication after your surgery it is ok to stop day one. Opioids include: Codeine, Hydrocodone(Norco, Vicodin), Oxycodone(Percocet, oxycontin) and hydromorphone amongst others.  Long term and even short term use of opiods can cause: Increased pain response Dependence Constipation Depression Respiratory depression And more.  Withdrawal symptoms can include Flu like symptoms Nausea, vomiting And more Techniques to manage these symptoms Hydrate well Eat regular healthy meals Stay active Use relaxation techniques(deep breathing, meditating, yoga) Do Not substitute Alcohol to help with tapering If you have been on opioids for less than two weeks and do not have pain than it is ok to stop all together.  Plan to wean off of opioids This plan should start within one week post op of your joint replacement. Maintain the same interval or time between taking each dose and first decrease the dose.  Cut the total daily intake of opioids by one tablet each day Next start to increase the time between doses. The last dose that should be eliminated is the evening dose.   MAKE SURE YOU:  Understand these instructions.  Get  help right away if you are not doing well or get worse.    Thank you for letting us be a part of your medical care  team.  It is a privilege we respect greatly.  We hope these instructions will help you stay on track for a fast and full recovery!    Dental Antibiotics:  In most cases prophylactic antibiotics for Dental procdeures after total joint surgery are not necessary.  Exceptions are as follows:  1. History of prior total joint infection  2. Severely immunocompromised (Organ Transplant, cancer chemotherapy, Rheumatoid biologic meds such as [place])  3. Poorly controlled diabetes (A1C > 8.0, blood glucose over 200)  If you have one of these conditions, contact your surgeon for an antibiotic prescription, prior to your dental procedure.   Increase activity slowly as tolerated   Complete by: As directed    Post-operative opioid taper instructions:   Complete by: As directed    POST-OPERATIVE OPIOID TAPER INSTRUCTIONS: It is important to wean off of your opioid medication as soon as possible. If you do not need pain medication after your surgery it is ok to stop day one. Opioids include: Codeine, Hydrocodone(Norco, Vicodin), Oxycodone(Percocet, oxycontin) and hydromorphone amongst others.  Long term and even short term use of opiods can cause: Increased pain response Dependence Constipation Depression Respiratory depression And more.  Withdrawal symptoms can include Flu like symptoms Nausea, vomiting And more Techniques to manage these symptoms Hydrate well Eat regular healthy meals Stay active Use relaxation techniques(deep breathing, meditating, yoga) Do Not substitute Alcohol to help with tapering If you have been on opioids for less than two weeks and do not have pain than it is ok to stop all together.  Plan to wean off of opioids This plan should start within one week post op of your joint replacement. Maintain the same interval or time between taking each dose and first decrease the dose.  Cut the total daily intake of opioids by one tablet each day Next start to increase  the time between doses. The last dose that should be eliminated is the evening dose.           Follow-up Information     Hilts, Legrand Como, MD Follow up.   Specialty: Family Medicine Contact information: [place] [address] [phone]         [facility] Follow up on 06/06/2021.   Specialty: Orthopedics Why: 10:45 am with Dr. Gita Kudo information: [place] [phone]                 Signed: Donella Stade 06/14/2021, 7:34 AM

## 2021-06-14 NOTE — Therapy (Signed)
OUTPATIENT PHYSICAL THERAPY TREATMENT NOTE   Patient Name: Cheryl Tate MRN: [id number] DOB:November 28, 1970, 51 y.o., female Today's Date: 06/14/2021   END OF SESSION:   PT End of Session - 06/14/21 0925     Visit Number 9    Number of Visits 20    Date for PT Re-Evaluation 07/20/21    Authorization Type [employer]    Authorization Time Period [facility] CHOICE  NO VISIT LIMIT, PATIENT PAYS 100% UNTIL DED IS MET, DED $7500 [id number] MET, TERM DATE 09/19/2021    Progress Note Due on Visit 10    PT Start Time 0923    PT Stop Time 1023    PT Time Calculation (min) 60 min    Activity Tolerance Patient limited by pain    Behavior During Therapy [facility] for tasks assessed/performed                  Past Medical History:  Diagnosis Date   Anemia    Depression    Menorrhagia    Mood disorder ([place])    Von Willebrand's disease ([place])    followed by hemotologist Dr. Marin Olp   Past Surgical History:  Procedure Laterality Date   APPENDECTOMY  01/22/2004   DILATION AND CURETTAGE OF UTERUS  2002   x 2 in 2002   DILITATION & CURRETTAGE/HYSTROSCOPY WITH HYDROTHERMAL ABLATION N/A 11/22/2014   Procedure: HYSTEROSCOPY WITH HYDROTHERMAL ABLATION;  Surgeon: Dian Queen, MD;  Location: [facility];  Service: Gynecology;  Laterality: N/A;   HYSTEROSCOPY WITH D & C  08-08-2014  in [place]   MULTIPLE TOOTH EXTRACTIONS     TOTAL KNEE ARTHROPLASTY Right 05/22/2021   Procedure: RIGHT TOTAL KNEE ARTHROPLASTY;  Surgeon: Meredith Pel, MD;  Location: [facility];  Service: Orthopedics;  Laterality: Right;   Patient Active Problem List   Diagnosis Date Noted   S/P knee replacement 05/22/2021   BMI 33.0-33.9,adult 06/16/2015   Von Willebrand disease ([place]) 06/13/2015   Depression 12/29/2014   Rhinitis, allergic 12/28/2014    PCP: Eunice Blase, MD   REFERRING PROVIDER: Donella Stade, PA-C   REFERRING DIAG: [phone] (ICD-10-CM) - History of total knee arthroplasty, unspecified  laterality  ONSET DATE: 05/22/2021 Right TKA   THERAPY DIAG:  Stiffness of right knee, not elsewhere classified  Acute pain of right knee  Localized edema  Muscle weakness (generalized)  Other abnormalities of gait and mobility  Unsteadiness on feet  PERTINENT HISTORY: Von Willebrand disease, depression, Menorrhgia  PRECAUTIONS: None  SUBJECTIVE:  She only slept 2-3 hours last night but due to issue with dog not her knee.  Her knee was no different this morning.   PAIN:  NPRS scale: 4-5/10 today and ranging 2/10 to 7/10 Pain location: right knee all over.  Pain description: sharp, sore, cramps Aggravating factors: fall Relieving factors: ice, meds   OBJECTIVE:    PATIENT SURVEYS:  05/28/2021 FOTO 29% functional & target 67%   COGNITION: 05/28/2021 Overall cognitive status: Within functional limits for tasks assessed                EDEMA: 05/28/2021 RLE above knee 51.8cm around knee 48cm below knee  41.2cm LLE above knee 45.8cm around knee 40cm below knee 34.1cm   PALPATION: 05/28/2021 Bruising along medial knee & lower leg, tenderness over joint line, quads, hamstrings   LE ROM:   ROM P: Passive  A: Active Right 05/28/21 Right 05/31/21 Right 06/05/21 Right 06/06/21 Right 06/11/21 Right 06/14/21  Hip flexion  Hip extension         Hip abduction         Hip adduction         Hip internal rotation         Hip external rotation         Knee flexion Supine P: 64* Seated  A: 64* Supine AROM heel slide:61 PROM overpressure in heel slide : 66   Supine AA: 73* Seated  P: 75*  Seated P: 79*  Knee extension Supine P: -10* Seated  A: -31* Seated AROM LAQ: -21 Seated LAQ A: -14* Supine P: -9* Supine A: -8 Supine P: -7*  Ankle dorsiflexion         Ankle plantarflexion         Ankle inversion         Ankle eversion          (Blank rows = not tested)   LE MMT:   MMT Right 05/28/2021 Left 05/28/2021  Hip flexion      Hip extension      Hip abduction       Hip adduction      Hip internal rotation      Hip external rotation      Knee flexion 2/5    Knee extension 2/5    Ankle dorsiflexion      Ankle plantarflexion      Ankle inversion      Ankle eversion       (Blank rows = not tested)   GAIT: 05/28/2021 Distance walked: 20' Assistive device utilized:  4 footed walker level of assistance: SBA  verbal cues needed Comments: antalgic, right knee flexed in stance, limited to no increase flexion for swing, RLE abducted,    Functional Activities: Pt using LLE to move RLE in & out of bed.  PT min guard RLE and she was able to move RLE without assisting with LLE.    TODAY'S TREATMENT: 06/14/2021 Therapeutic Exercise:  Aerobic: recumbent bike SciFit seat 7 rocking back & forth for flexion stretch for 8 min  Machines for strength: Leg Press BLEs 87# 30 reps with 5 sec holds at max ROM. RLE only 43# 15 reps 2 sets. PT manual assist at end ranges for increase in ROM.  Supine: SAQ 2# 15 reps Prone:  Seated: LAQ and active knee flexion with LLE+ opposing motion 20 reps.   Standing:  Neuromuscular Re-education: tandem stance on foam 30 sec RLE in front & in back 2 reps ea.   SLS on foam up to 4 sec for 5 reps. Then SLS on floor up to [place]. Manual Therapy: Rt knee PROM for flexion and extension to tolerance and contract relax Therapeutic Activity:   Self Care: PT discussed using pain meds for management.  Pt verbalized understanding.  Trigger Point Dry Needling:  Modalities: vaso rt knee high compression 34* 10 min with elevation and muscle activity (quad sets & alphabet)  06/13/2021 Therapeutic Exercise:  Aerobic: recumbent bike SciFit seat 7 rocking back & forth for flexion stretch for 8 min  Machines for strength: Leg Press BLEs 87# 30 reps with 5 sec holds at max ROM. RLE only 37# 15 reps 2 sets.  Supine: Prone:  Seated: LAQ 2# and active knee flexion with LLE opposing motion 20 reps.   Standing: Pre-gait stepping RLE over 2" box 10" long  rolling over toes for knee flexion and wt shift onto RLE in initial contact & terminal stance   Step  up & down 6" step in //bars BUE support 10 reps. PT demo & verbal cues on technique.  Neuromuscular Re-education: tandem stance on foam 30 sec RLE in front & in back 2 reps ea.  Manual Therapy: Rt knee PROM for flexion and extension to tolerance and contract relax Therapeutic Activity:  PT recommended sitting behind wheel of car in park but engine running. Work on moving RLE from gas to brake depressing.  Work on increase fluency of motion & endurance.  She should move the seat back to get in & out. Pt verbalized understanding.  Self Care: PT recommended elevating LEs higher than heart >/= 15 min >/= 2x/day and using muscles like alphabet or quad sets. Pt verbalized understanding.  PT recommended sitting with RLE either ext with heel on floor or knee flex to tolerance with goal foot under knee. Pt verbalized understanding.  Trigger Point Dry Needling:  Modalities: vaso rt knee high compression 34* 10 min with elevation and muscle activity (quad sets & alphabet)    06/11/21 Ambulation 150 feet with SPC with supervision with quad tip cane and 150 feet with SPC with supervision, she preferred quad tip cane and PT recommended she get one to help transition from RW  Seated knee flexion stretch AAROM left on top of Rt 10 sec hold X 3 min  Seated LAQ 2# on Rt, with contralateral movement opposite, pause in end range flexion /extension x 3 min  Supine heel slides AAROM 5 sec hold at max flexion, then moving into quad set 5 sec hold into max extension X 3 min  Supine Quad sets 5 sec X 2 min  Supine hamstring stretch with strap 30 sec  X3  Seated low load long duration knee flexion stretch 3 min with blue band around Rt ankle that was anchored to 30#KB  Manual therapy: Rt knee PROM for flexion and extension to tolerance     HOME EXERCISE PROGRAM: Access Code: [email or web address] URL:  [email or web address] Date: 05/28/2021 Prepared by: Jamey Reas   Exercises - Quad Setting and Stretching  - 2-4 x daily - 7 x weekly - 5-10 sets - 10 reps - prop 5-10 minutes & quad set5 seconds hold - Ankle Alphabet in Elevation  - 2-4 x daily - 7 x weekly - 1 sets - 1 reps - Supine Heel Slide with Strap  - 2-3 x daily - 7 x weekly - 2-3 sets - 10 reps - 5 seconds hold - Supine Heel Slide with Small Ball  - 2-3 x daily - 7 x weekly - 2-3 sets - 10 reps - 5 seconds hold - Supine Straight Leg Raises  - 2-3 x daily - 7 x weekly - 2-3 sets - 10 reps - 5 seconds hold - Seated Knee Flexion Extension AROM   - 2-4 x daily - 7 x weekly - 2-3 sets - 10 reps - 5 seconds hold - Seated Knee Flexion AAROM  - 1 x daily - 5 x weekly - 1 sets - 10 reps - 5 seconds hold - Seated straight leg lifts  - 2-3 x daily - 7 x weekly - 2-3 sets - 10 reps - 5 seconds hold - Seated Hamstring Stretch with Strap  - 2-4 x daily - 7 x weekly - 1 sets - 3 reps - 20-30 seconds hold   ASSESSMENT:   CLINICAL IMPRESSION: Patient is slowly decreasing need for narcotic meds for pain management.  Her PROM is slowly improving.  She is tolerating slight  progression in activities & exercises.  Pt continues to benefit from skilled PT s/p TKR.   OBJECTIVE IMPAIRMENTS Abnormal gait, decreased activity tolerance, decreased balance, decreased knowledge of condition, decreased knowledge of use of DME, decreased mobility, difficulty walking, decreased ROM, decreased strength, increased edema, increased muscle spasms, impaired flexibility, postural dysfunction, and pain.    ACTIVITY LIMITATIONS community activity, driving, and personal recreation like Yoga & walking dogs .    PERSONAL FACTORS 1-2 comorbidities: see PMH  are also affecting patient's functional outcome.    REHAB POTENTIAL: Good   CLINICAL DECISION MAKING: Stable/uncomplicated   EVALUATION COMPLEXITY: Low     GOALS: Goals reviewed with patient? Yes    SHORT TERM GOALS: Target date: 06/22/2021   Patient independent and verbalizes compliance with initial HEP Baseline: SEE OBJECTIVE DATA Goal status: on going 05/29/2021   2.  Patient reports 50% improvement in right knee pain. Baseline: SEE OBJECTIVE DATA Goal status: on going 05/29/2021   3.  PROM right knee extension -3* to flexion 90* Baseline: SEE OBJECTIVE DATA Goal status: on going 05/29/2021   LONG TERM GOALS: Target date: 07/20/2021   Patient will improve FOTO score to 67% Baseline: SEE OBJECTIVE DATA Goal status: INITIAL   2.  Patient reports right knee pain </= 2/10 with standing & gait activities.  Baseline: SEE OBJECTIVE DATA Goal status: INITIAL   3.  Right Knee PROM 0* extension to 110* flexion Baseline: SEE OBJECTIVE DATA Goal status: INITIAL   4.  Right Knee AROM seated -3* extension to 100* flexion Baseline: SEE OBJECTIVE DATA Goal status: INITIAL   5.  Patient ambulates >500' community distances including negotiating ramps, curbs & stairs without device independently. Baseline: SEE OBJECTIVE DATA Goal status: INITIAL   6.  Patient reports ability to perform Yoga & walk her dogs. Baseline: SEE OBJECTIVE DATA Goal status: INITIAL   PLAN: PT FREQUENCY: 3x/week for 4 weeks then 2x/wk for 4 weeks.   PT DURATION: 8 weeks   PLANNED INTERVENTIONS: Therapeutic exercises, Therapeutic activity, Neuromuscular re-education, Balance training, Gait training, Patient/Family education, Joint mobilization, Stair training, Vestibular training, DME instructions, Electrical stimulation, Cryotherapy, Moist heat, scar mobilization, Taping, Vasopneumatic device, and Manual therapy   PLAN FOR NEXT SESSION: check STGs, continue manual therapy, therapeutic exercise progressing range & function, vaso to end  Jamey Reas, PT, DPT 06/14/21 1:04 PM

## 2021-06-14 NOTE — Telephone Encounter (Signed)
Please send the patient a msg or call to let her know that the Pain Medication has been called in

## 2021-06-14 NOTE — Telephone Encounter (Signed)
I called pt and advised her that the medication is ready at the pharmacy. She stated understanding. She is wanting to wean off of the oxy and wanted a stronger tylenol rx that doesn't have codeine to be sent to the pharmacy. Please advise

## 2021-06-14 NOTE — Telephone Encounter (Signed)
Rx was just sent in on the 23rd. Tried calling pharmacy to confirm but they dont open until 9

## 2021-06-15 ENCOUNTER — Other Ambulatory Visit: Payer: Self-pay | Admitting: Surgical

## 2021-06-15 DIAGNOSIS — M1711 Unilateral primary osteoarthritis, right knee: Secondary | ICD-10-CM

## 2021-06-15 MED ORDER — ACETAMINOPHEN 500 MG PO TABS: 1000.0000 mg | ORAL_TABLET | Freq: Three times a day (TID) | ORAL | 2 refills | Status: AC | PRN

## 2021-06-15 NOTE — Telephone Encounter (Signed)
Sent in refill for Tylenol with new directions for 1010m q8h prn.

## 2021-06-19 ENCOUNTER — Encounter: Payer: Self-pay | Admitting: Physical Therapy

## 2021-06-19 ENCOUNTER — Other Ambulatory Visit: Payer: Self-pay | Admitting: Surgical

## 2021-06-19 ENCOUNTER — Ambulatory Visit (INDEPENDENT_AMBULATORY_CARE_PROVIDER_SITE_OTHER): Payer: No Typology Code available for payment source | Admitting: Physical Therapy

## 2021-06-19 ENCOUNTER — Encounter: Payer: Self-pay | Admitting: Orthopedic Surgery

## 2021-06-19 DIAGNOSIS — R2689 Other abnormalities of gait and mobility: Secondary | ICD-10-CM

## 2021-06-19 DIAGNOSIS — M25661 Stiffness of right knee, not elsewhere classified: Secondary | ICD-10-CM

## 2021-06-19 DIAGNOSIS — M6281 Muscle weakness (generalized): Secondary | ICD-10-CM

## 2021-06-19 DIAGNOSIS — M25561 Pain in right knee: Secondary | ICD-10-CM

## 2021-06-19 DIAGNOSIS — R600 Localized edema: Secondary | ICD-10-CM | POA: Diagnosis not present

## 2021-06-19 MED ORDER — GABAPENTIN 300 MG PO CAPS: 300.0000 mg | ORAL_CAPSULE | Freq: Two times a day (BID) | ORAL | 0 refills | Status: DC

## 2021-06-19 NOTE — Therapy (Signed)
OUTPATIENT PHYSICAL THERAPY TREATMENT NOTE Progress Note reporting period date 05/28/21 to 06/19/21  See below for objective and subjective measurements relating to patients progress with PT.    Patient Name: Cheryl Tate MRN: [id number] DOB:[birth year], 51 y.o., female Today's Date: 06/19/2021   END OF SESSION:   PT End of Session - 06/19/21 1443     Visit Number 10    Number of Visits 20    Date for PT Re-Evaluation 07/20/21    Authorization Type U[employer]    Authorization Time Period [facility] CHOICE  NO VISIT LIMIT, PATIENT PAYS 100% UNTIL DED IS MET, DED $7500 [id number] MET, TERM DATE 09/19/2021    Progress Note Due on Visit 20    PT Start Time 1435    PT Stop Time 1521    PT Time Calculation (min) 46 min    Activity Tolerance Patient limited by pain    Behavior During Therapy [facility] for tasks assessed/performed                  Past Medical History:  Diagnosis Date   Anemia    Depression    Menorrhagia    Mood disorder ([place])    Von Willebrand's disease ([place])    followed by hemotologist Dr. Marin Olp   Past Surgical History:  Procedure Laterality Date   APPENDECTOMY  01/22/2004   DILATION AND CURETTAGE OF UTERUS  2002   x 2 in 2002   DILITATION & CURRETTAGE/HYSTROSCOPY WITH HYDROTHERMAL ABLATION N/A 11/22/2014   Procedure: HYSTEROSCOPY WITH HYDROTHERMAL ABLATION;  Surgeon: Dian Queen, MD;  Location: [place];  Service: Gynecology;  Laterality: N/A;   HYSTEROSCOPY WITH D & C  08-08-2014  in [place]   MULTIPLE TOOTH EXTRACTIONS     TOTAL KNEE ARTHROPLASTY Right 05/22/2021   Procedure: RIGHT TOTAL KNEE ARTHROPLASTY;  Surgeon: Meredith Pel, MD;  Location: [place];  Service: Orthopedics;  Laterality: Right;   Patient Active Problem List   Diagnosis Date Noted   Arthritis of right knee    S/P knee replacement 05/22/2021   BMI 33.0-33.9,adult 06/16/2015   Von Willebrand disease ([place]) 06/13/2015   Depression 12/29/2014   Rhinitis, allergic  12/28/2014    PCP: Eunice Blase, MD   REFERRING PROVIDER: Donella Stade, PA-C   REFERRING DIAG: [phone] (ICD-10-CM) - History of total knee arthroplasty, unspecified laterality  ONSET DATE: 05/22/2021 Right TKA   THERAPY DIAG:  Stiffness of right knee, not elsewhere classified  Acute pain of right knee  Localized edema  Muscle weakness (generalized)  Other abnormalities of gait and mobility  PERTINENT HISTORY: Von Willebrand disease, depression, Menorrhgia  PRECAUTIONS: None  SUBJECTIVE:  She relays she does not really feel her knee is improving, she was in a lot of pain over the weekend and thinks it may be due to the rain. She states she has not been able to do her exercises sometimes as her dog has had several emergencies.    PAIN:  NPRS scale: 4-5/10 today and ranging 2/10 to 7/10 Pain location: right knee all over.  Pain description: sharp, sore, cramps Aggravating factors: fall Relieving factors: ice, meds   OBJECTIVE:    PATIENT SURVEYS:  05/28/2021 FOTO 29% functional & target 67%   COGNITION: 05/28/2021 Overall cognitive status: Within functional limits for tasks assessed                EDEMA: 05/28/2021 RLE above knee 51.8cm around knee 48cm below knee  41.2cm LLE above knee 45.8cm  around knee 40cm below knee 34.1cm   PALPATION: 05/28/2021 Bruising along medial knee & lower leg, tenderness over joint line, quads, hamstrings   LE ROM:   ROM P: Passive  A: Active Right 05/28/21 Right 05/31/21 Right 06/05/21 Right 06/06/21 Right 06/11/21 Right 06/14/21 Right 06/19/21  Hip flexion          Hip extension          Hip abduction          Hip adduction          Hip internal rotation          Hip external rotation          Knee flexion Supine P: 64* Seated  A: 64* Supine AROM heel slide:61 PROM overpressure in heel slide : 66   Supine AA: 73* Seated  P: 75*  Seated P: 79* Seated P: 75  Knee extension Supine P: -10* Seated  A: -31* Seated AROM  LAQ: -21 Seated LAQ A: -14* Supine P: -9* Supine A: -8 Supine P: -7* Supine A: -8 P:-6  Ankle dorsiflexion          Ankle plantarflexion          Ankle inversion          Ankle eversion           (Blank rows = not tested)   LE MMT:   MMT Right 05/28/2021 Left 05/28/2021  Hip flexion      Hip extension      Hip abduction      Hip adduction      Hip internal rotation      Hip external rotation      Knee flexion 2/5 3   Knee extension 2/5  3  Ankle dorsiflexion      Ankle plantarflexion      Ankle inversion      Ankle eversion       (Blank rows = not tested)   GAIT: 05/28/2021 Distance walked: 39' Assistive device utilized:  4 footed walker level of assistance: SBA  verbal cues needed Comments: antalgic, right knee flexed in stance, limited to no increase flexion for swing, RLE abducted,    Functional Activities: Pt using LLE to move RLE in & out of bed.  PT min guard RLE and she was able to move RLE without assisting with LLE.    TODAY'S TREATMENT: 06/19/2021 Therapeutic Exercise:  Aerobic: Nu step X 6 min for ROM L5  Machines for strength: Leg Press BLEs 87# 30 reps with 5 sec holds at max ROM. RLE only 43# 15 reps 2 sets.  Supine: AAROM heel slides 5 sec  X15 Prone:  Seated: LAQ and active knee flexion with LLE+ opposing motion 20 reps with 2# on Rt. Seated AAROM knee flexion stretch 10 sec X 10  Standing: Gait with SPC to no AD 50 feet X3 Manual Therapy: Rt knee PROM for flexion and extension to tolerance and contract relax Therapeutic Activity:   Modalities: vaso rt knee high compression 34* 10 min  06/14/2021 Therapeutic Exercise:  Aerobic: recumbent bike SciFit seat 7 rocking back & forth for flexion stretch for 8 min  Machines for strength: Leg Press BLEs 87# 30 reps with 5 sec holds at max ROM. RLE only 43# 15 reps 2 sets. PT manual assist at end ranges for increase in ROM.  Supine: SAQ 2# 15 reps Prone:  Seated: LAQ and active knee flexion with LLE+  opposing motion 20  reps.   Standing:  Neuromuscular Re-education: tandem stance on foam 30 sec RLE in front & in back 2 reps ea.   SLS on foam up to 4 sec for 5 reps. Then SLS on floor up to [place]. Manual Therapy: Rt knee PROM for flexion and extension to tolerance and contract relax Therapeutic Activity:   Self Care: PT discussed using pain meds for management.  Pt verbalized understanding.  Trigger Point Dry Needling:  Modalities: vaso rt knee high compression 34* 10 min with elevation and muscle activity (quad sets & alphabet) 06/14/2021 Therapeutic Exercise:  Aerobic: recumbent bike SciFit seat 7 rocking back & forth for flexion stretch for 8 min  Machines for strength: Leg Press BLEs 87# 30 reps with 5 sec holds at max ROM. RLE only 43# 15 reps 2 sets. PT manual assist at end ranges for increase in ROM.  Supine: SAQ 2# 15 reps Prone:  Seated: LAQ and active knee flexion with LLE+ opposing motion 20 reps.   Standing:  Neuromuscular Re-education: tandem stance on foam 30 sec RLE in front & in back 2 reps ea.   SLS on foam up to 4 sec for 5 reps. Then SLS on floor up to [place]. Manual Therapy: Rt knee PROM for flexion and extension to tolerance and contract relax Therapeutic Activity:   Self Care: PT discussed using pain meds for management.  Pt verbalized understanding.  Trigger Point Dry Needling:  Modalities: vaso rt knee high compression 34* 10 min with elevation and muscle activity (quad sets & alphabet)  06/13/2021 Therapeutic Exercise:  Aerobic: recumbent bike SciFit seat 7 rocking back & forth for flexion stretch for 8 min  Machines for strength: Leg Press BLEs 87# 30 reps with 5 sec holds at max ROM. RLE only 37# 15 reps 2 sets.  Supine: Prone:  Seated: LAQ 2# and active knee flexion with LLE opposing motion 20 reps.   Standing: Pre-gait stepping RLE over 2" box 10" long rolling over toes for knee flexion and wt shift onto RLE in initial contact & terminal stance   Step up &  down 6" step in //bars BUE support 10 reps. PT demo & verbal cues on technique.  Neuromuscular Re-education: tandem stance on foam 30 sec RLE in front & in back 2 reps ea.  Manual Therapy: Rt knee PROM for flexion and extension to tolerance and contract relax Therapeutic Activity:  PT recommended sitting behind wheel of car in park but engine running. Work on moving RLE from gas to brake depressing.  Work on increase fluency of motion & endurance.  She should move the seat back to get in & out. Pt verbalized understanding.  Self Care: PT recommended elevating LEs higher than heart >/= 15 min >/= 2x/day and using muscles like alphabet or quad sets. Pt verbalized understanding.  PT recommended sitting with RLE either ext with heel on floor or knee flex to tolerance with goal foot under knee. Pt verbalized understanding.  Trigger Point Dry Needling:  Modalities: vaso rt knee high compression 34* 10 min with elevation and muscle activity (quad sets & alphabet)    06/11/21 Ambulation 150 feet with SPC with supervision with quad tip cane and 150 feet with SPC with supervision, she preferred quad tip cane and PT recommended she get one to help transition from RW  Seated knee flexion stretch AAROM left on top of Rt 10 sec hold X 3 min  Seated LAQ 2# on Rt, with contralateral movement opposite, pause in  end range flexion /extension x 3 min  Supine heel slides AAROM 5 sec hold at max flexion, then moving into quad set 5 sec hold into max extension X 3 min  Supine Quad sets 5 sec X 2 min  Supine hamstring stretch with strap 30 sec  X3  Seated low load long duration knee flexion stretch 3 min with blue band around Rt ankle that was anchored to 30#KB  Manual therapy: Rt knee PROM for flexion and extension to tolerance     HOME EXERCISE PROGRAM: Access Code: [email or web address] URL: [email or web address] Date: 06/19/2021 Prepared by: Elsie Ra  Exercises - Quad Setting and Stretching  -  2-4 x daily - 7 x weekly - 5-10 sets - 10 reps - prop 5-10 minutes & quad set5 seconds hold - Supine Heel Slide with Strap  - 2-3 x daily - 7 x weekly - 2-3 sets - 10 reps - 5 seconds hold - Seated Knee Flexion Stretch  - 2 x daily - 6 x weekly - 1-2 sets - 10 reps - 5 sec hold - Seated straight leg lifts  - 2-3 x daily - 7 x weekly - 2-3 sets - 10 reps - 5 seconds hold - Seated Hamstring Stretch with Strap  - 2-4 x daily - 7 x weekly - 1 sets - 3 reps - 20-30 seconds hold - Sit to Stand with Counter Support  - 2 x daily - 6 x weekly - 1-2 sets - 10 reps   ASSESSMENT:   CLINICAL IMPRESSION: Progress has been slow up to this point. She is limited by pain and this makes it difficult to progress her Rt knee ROM and strength. Her ambulation is progressing with less overall dependence on AD. I did progress her HEP some and printed out copy as she states she does not remember what she is supposed to be doing at home. More work at home should help improve her progress within PT. PT recommending to continue current PT plan of care to improve Rt knee strength and ROM as tolerated to improve functional abilities.    OBJECTIVE IMPAIRMENTS Abnormal gait, decreased activity tolerance, decreased balance, decreased knowledge of condition, decreased knowledge of use of DME, decreased mobility, difficulty walking, decreased ROM, decreased strength, increased edema, increased muscle spasms, impaired flexibility, postural dysfunction, and pain.    ACTIVITY LIMITATIONS community activity, driving, and personal recreation like Yoga & walking dogs .    PERSONAL FACTORS 1-2 comorbidities: see PMH  are also affecting patient's functional outcome.    REHAB POTENTIAL: Good   CLINICAL DECISION MAKING: Stable/uncomplicated   EVALUATION COMPLEXITY: Low     GOALS: Goals reviewed with patient? Yes   SHORT TERM GOALS: Target date: 06/22/2021   Patient independent and verbalizes compliance with initial HEP Baseline: SEE  OBJECTIVE DATA Goal status: on going 05/29/2021   2.  Patient reports 50% improvement in right knee pain. Baseline: SEE OBJECTIVE DATA Goal status: on going 05/29/2021   3.  PROM right knee extension -3* to flexion 90* Baseline: SEE OBJECTIVE DATA Goal status: on going 05/29/2021   LONG TERM GOALS: Target date: 07/20/2021   Patient will improve FOTO score to 67% Baseline: SEE OBJECTIVE DATA Goal status: INITIAL   2.  Patient reports right knee pain </= 2/10 with standing & gait activities.  Baseline: SEE OBJECTIVE DATA Goal status: INITIAL   3.  Right Knee PROM 0* extension to 110* flexion Baseline: SEE OBJECTIVE DATA Goal status: INITIAL   4.  Right  Knee AROM seated -3* extension to 100* flexion Baseline: SEE OBJECTIVE DATA Goal status: INITIAL   5.  Patient ambulates >500' community distances including negotiating ramps, curbs & stairs without device independently. Baseline: SEE OBJECTIVE DATA Goal status: INITIAL   6.  Patient reports ability to perform Yoga & walk her dogs. Baseline: SEE OBJECTIVE DATA Goal status: INITIAL   PLAN: PT FREQUENCY: 3x/week for 4 weeks then 2x/wk for 4 weeks.   PT DURATION: 8 weeks   PLANNED INTERVENTIONS: Therapeutic exercises, Therapeutic activity, Neuromuscular re-education, Balance training, Gait training, Patient/Family education, Joint mobilization, Stair training, Vestibular training, DME instructions, Electrical stimulation, Cryotherapy, Moist heat, scar mobilization, Taping, Vasopneumatic device, and Manual therapy   PLAN FOR NEXT SESSION: take out progress note section when copying over new note. continue manual therapy, therapeutic exercise progressing range & function, vaso to end  Elsie Ra, PT, DPT 06/19/21 3:56 PM

## 2021-06-19 NOTE — Telephone Encounter (Signed)
Lvm informing pt

## 2021-06-20 ENCOUNTER — Ambulatory Visit (INDEPENDENT_AMBULATORY_CARE_PROVIDER_SITE_OTHER): Payer: No Typology Code available for payment source | Admitting: Physical Therapy

## 2021-06-20 ENCOUNTER — Encounter: Payer: Self-pay | Admitting: Physical Therapy

## 2021-06-20 DIAGNOSIS — M25661 Stiffness of right knee, not elsewhere classified: Secondary | ICD-10-CM

## 2021-06-20 DIAGNOSIS — M25561 Pain in right knee: Secondary | ICD-10-CM

## 2021-06-20 DIAGNOSIS — R2681 Unsteadiness on feet: Secondary | ICD-10-CM

## 2021-06-20 DIAGNOSIS — R600 Localized edema: Secondary | ICD-10-CM | POA: Diagnosis not present

## 2021-06-20 DIAGNOSIS — M6281 Muscle weakness (generalized): Secondary | ICD-10-CM | POA: Diagnosis not present

## 2021-06-20 DIAGNOSIS — R2689 Other abnormalities of gait and mobility: Secondary | ICD-10-CM

## 2021-06-20 NOTE — Therapy (Signed)
OUTPATIENT PHYSICAL THERAPY TREATMENT NOTE    Patient Name: ANWEN CANNEDY MRN: [id number] DOB:1970/09/06, 51 y.o., female Today's Date: 06/20/2021   END OF SESSION:   PT End of Session - 06/20/21 1430     Visit Number 11    Number of Visits 20    Date for PT Re-Evaluation 07/20/21    Authorization Type U[employer]    Authorization Time Period [employer] CHOICE  NO VISIT LIMIT, PATIENT PAYS 100% UNTIL DED IS MET, DED $7500 [id number] MET, TERM DATE 09/19/2021    Progress Note Due on Visit 20    PT Start Time 1430    PT Stop Time 1527    PT Time Calculation (min) 57 min    Activity Tolerance Patient limited by pain    Behavior During Therapy [facility] for tasks assessed/performed                   Past Medical History:  Diagnosis Date   Anemia    Depression    Menorrhagia    Mood disorder ([place])    Von Willebrand's disease ([place])    followed by hemotologist Dr. Marin Olp   Past Surgical History:  Procedure Laterality Date   APPENDECTOMY  01/22/2004   DILATION AND CURETTAGE OF UTERUS  2002   x 2 in 2002   DILITATION & CURRETTAGE/HYSTROSCOPY WITH HYDROTHERMAL ABLATION N/A 11/22/2014   Procedure: HYSTEROSCOPY WITH HYDROTHERMAL ABLATION;  Surgeon: Dian Queen, MD;  Location: [facility] [facility];  Service: Gynecology;  Laterality: N/A;   HYSTEROSCOPY WITH D & C  08-08-2014  in [place]   MULTIPLE TOOTH EXTRACTIONS     TOTAL KNEE ARTHROPLASTY Right 05/22/2021   Procedure: RIGHT TOTAL KNEE ARTHROPLASTY;  Surgeon: Meredith Pel, MD;  Location: [place];  Service: Orthopedics;  Laterality: Right;   Patient Active Problem List   Diagnosis Date Noted   Arthritis of right knee    S/P knee replacement 05/22/2021   BMI 33.0-33.9,adult 06/16/2015   Von Willebrand disease ([place]) 06/13/2015   Depression 12/29/2014   Rhinitis, allergic 12/28/2014    PCP: Eunice Blase, MD   REFERRING PROVIDER: Donella Stade, PA-C   REFERRING DIAG: [phone] (ICD-10-CM) - History of total knee  arthroplasty, unspecified laterality  ONSET DATE: 05/22/2021 Right TKA   THERAPY DIAG:  Stiffness of right knee, not elsewhere classified  Acute pain of right knee  Localized edema  Muscle weakness (generalized)  Other abnormalities of gait and mobility  Unsteadiness on feet  PERTINENT HISTORY: Von Willebrand disease, depression, Menorrhgia  PRECAUTIONS: None  SUBJECTIVE:  She had bad day over the weekend because did not exercise with pain. The weather may have something to do with it.   PAIN:  NPRS scale:  4-5/10 today and ranging 2/10 to 7/10 Pain location: right knee all over.  Pain description: sharp, sore, cramps Aggravating factors: fall Relieving factors: ice, meds   OBJECTIVE:    PATIENT SURVEYS:  05/28/2021 FOTO 29% functional & target 67%   COGNITION: 05/28/2021 Overall cognitive status: Within functional limits for tasks assessed                EDEMA: 05/28/2021 RLE above knee 51.8cm around knee 48cm below knee  41.2cm LLE above knee 45.8cm around knee 40cm below knee 34.1cm   PALPATION: 05/28/2021 Bruising along medial knee & lower leg, tenderness over joint line, quads, hamstrings   LE ROM:   ROM P: Passive  A: Active Right 05/28/21 Right 05/31/21 Right 06/05/21 Right 06/06/21 Right  06/11/21 Right 06/14/21 Right 06/19/21  Hip flexion          Hip extension          Hip abduction          Hip adduction          Hip internal rotation          Hip external rotation          Knee flexion Supine P: 64* Seated  A: 64* Supine AROM heel slide:61 PROM overpressure in heel slide : 66   Supine AA: 73* Seated  P: 75*  Seated P: 79* Seated P: 75  Knee extension Supine P: -10* Seated  A: -31* Seated AROM LAQ: -21 Seated LAQ A: -14* Supine P: -9* Supine A: -8 Supine P: -7* Supine A: -8 P:-6  Ankle dorsiflexion          Ankle plantarflexion          Ankle inversion          Ankle eversion           (Blank rows = not tested)   LE MMT:   MMT  Right 05/28/2021 Left 05/28/2021  Hip flexion      Hip extension      Hip abduction      Hip adduction      Hip internal rotation      Hip external rotation      Knee flexion 2/5 3   Knee extension 2/5  3  Ankle dorsiflexion      Ankle plantarflexion      Ankle inversion      Ankle eversion       (Blank rows = not tested)   GAIT: 05/28/2021 Distance walked: 36' Assistive device utilized:  4 footed walker level of assistance: SBA  verbal cues needed Comments: antalgic, right knee flexed in stance, limited to no increase flexion for swing, RLE abducted,    Functional Activities: Pt using LLE to move RLE in & out of bed.  PT min guard RLE and she was able to move RLE without assisting with LLE.    TODAY'S TREATMENT: 06/20/2021 Therapeutic Exercise:  Aerobic: SciFit recumbent bike seat 8 rocking back & forth with 5 sec hold flexion  Machines for strength: Leg Press BLEs 93# 30 reps with 5 sec holds at max ROM. RLE only 50# 15 reps 2 sets.  Supine:  Prone:  Seated: LAQ and active knee flexion with LLE+ opposing motion 20 reps with 2# on Rt. Seated AAROM knee flexion stretch 10 sec X 10  Standing: gastroc stretch on incline board 30 sec hold 2 reps   Heel raises on incline board 15 reps.   Squat holding sink 5 sec hold 15 reps.  Manual Therapy: Rt knee PROM for flexion and extension to tolerance and contract relax Self-care: PT reviewed need to exercise moving knee max ext & max flex every awake hour during day and position in ext or flex when resting seated or supine. Pt verbalized understanding.  Therapeutic Activity:  PT demo & verbal cues on gait with ext in stance & flexion in swing.  PT recommended using cane if necessary to walk without "stiff knee" pt verbalized & return demo understanding.  Modalities: vaso rt knee high compression 34* 10 min  06/19/2021 Therapeutic Exercise:  Aerobic: Nu step X 6 min for ROM L5  Machines for strength: Leg Press BLEs 87# 30 reps with 5 sec  holds at max ROM.  RLE only 43# 15 reps 2 sets.  Supine: AAROM heel slides 5 sec  X15 Prone:  Seated: LAQ and active knee flexion with LLE+ opposing motion 20 reps with 2# on Rt. Seated AAROM knee flexion stretch 10 sec X 10  Standing: Gait with SPC to no AD 50 feet X3 Manual Therapy: Rt knee PROM for flexion and extension to tolerance and contract relax Therapeutic Activity:   Modalities: vaso rt knee high compression 34* 10 min  06/14/2021 Therapeutic Exercise:  Aerobic: recumbent bike SciFit seat 7 rocking back & forth for flexion stretch for 8 min  Machines for strength: Leg Press BLEs 87# 30 reps with 5 sec holds at max ROM. RLE only 43# 15 reps 2 sets. PT manual assist at end ranges for increase in ROM.  Supine: SAQ 2# 15 reps Prone:  Seated: LAQ and active knee flexion with LLE+ opposing motion 20 reps.   Standing:  Neuromuscular Re-education: tandem stance on foam 30 sec RLE in front & in back 2 reps ea.   SLS on foam up to 4 sec for 5 reps. Then SLS on floor up to [place]. Manual Therapy: Rt knee PROM for flexion and extension to tolerance and contract relax Therapeutic Activity:   Self Care: PT discussed using pain meds for management.  Pt verbalized understanding.  Trigger Point Dry Needling:  Modalities: vaso rt knee high compression 34* 10 min with elevation and muscle activity (quad sets & alphabet)   HOME EXERCISE PROGRAM: Access Code: [email or web address] URL: [email or web address] Date: 06/19/2021 Prepared by: Elsie Ra  Exercises - Quad Setting and Stretching  - 2-4 x daily - 7 x weekly - 5-10 sets - 10 reps - prop 5-10 minutes & quad set5 seconds hold - Supine Heel Slide with Strap  - 2-3 x daily - 7 x weekly - 2-3 sets - 10 reps - 5 seconds hold - Seated Knee Flexion Stretch  - 2 x daily - 6 x weekly - 1-2 sets - 10 reps - 5 sec hold - Seated straight leg lifts  - 2-3 x daily - 7 x weekly - 2-3 sets - 10 reps - 5 seconds hold - Seated Hamstring Stretch with  Strap  - 2-4 x daily - 7 x weekly - 1 sets - 3 reps - 20-30 seconds hold - Sit to Stand with Counter Support  - 2 x daily - 6 x weekly - 1-2 sets - 10 reps   ASSESSMENT:   CLINICAL IMPRESSION: Pt reports limiting her exercises due to pain but verbalizes understanding for need to move frequently when awake. Walking alone with not maximize her range.  Pt responds well to skilled PT for range.  She appears anxious when moving knee so guards and limits out of PT activities.    OBJECTIVE IMPAIRMENTS Abnormal gait, decreased activity tolerance, decreased balance, decreased knowledge of condition, decreased knowledge of use of DME, decreased mobility, difficulty walking, decreased ROM, decreased strength, increased edema, increased muscle spasms, impaired flexibility, postural dysfunction, and pain.    ACTIVITY LIMITATIONS community activity, driving, and personal recreation like Yoga & walking dogs .    PERSONAL FACTORS 1-2 comorbidities: see PMH  are also affecting patient's functional outcome.    REHAB POTENTIAL: Good   CLINICAL DECISION MAKING: Stable/uncomplicated   EVALUATION COMPLEXITY: Low     GOALS: Goals reviewed with patient? Yes   SHORT TERM GOALS: Target date: 06/22/2021   Patient independent and verbalizes compliance with initial HEP Baseline: SEE OBJECTIVE DATA Goal status:  on going 05/29/2021   2.  Patient reports 50% improvement in right knee pain. Baseline: SEE OBJECTIVE DATA Goal status: on going 05/29/2021   3.  PROM right knee extension -3* to flexion 90* Baseline: SEE OBJECTIVE DATA Goal status: on going 05/29/2021   LONG TERM GOALS: Target date: 07/20/2021   Patient will improve FOTO score to 67% Baseline: SEE OBJECTIVE DATA Goal status: INITIAL   2.  Patient reports right knee pain </= 2/10 with standing & gait activities.  Baseline: SEE OBJECTIVE DATA Goal status: INITIAL   3.  Right Knee PROM 0* extension to 110* flexion Baseline: SEE OBJECTIVE DATA Goal  status: INITIAL   4.  Right Knee AROM seated -3* extension to 100* flexion Baseline: SEE OBJECTIVE DATA Goal status: INITIAL   5.  Patient ambulates >500' community distances including negotiating ramps, curbs & stairs without device independently. Baseline: SEE OBJECTIVE DATA Goal status: INITIAL   6.  Patient reports ability to perform Yoga & walk her dogs. Baseline: SEE OBJECTIVE DATA Goal status: INITIAL   PLAN: PT FREQUENCY: 3x/week for 4 weeks then 2x/wk for 4 weeks.   PT DURATION: 8 weeks   PLANNED INTERVENTIONS: Therapeutic exercises, Therapeutic activity, Neuromuscular re-education, Balance training, Gait training, Patient/Family education, Joint mobilization, Stair training, Vestibular training, DME instructions, Electrical stimulation, Cryotherapy, Moist heat, scar mobilization, Taping, Vasopneumatic device, and Manual therapy   PLAN FOR NEXT SESSION: check how HEP & frequent movement are going.  continue manual therapy, therapeutic exercise progressing range & function, vaso to end  Jamey Reas, PT, DPT 06/20/21 4:15 PM

## 2021-06-21 ENCOUNTER — Ambulatory Visit (INDEPENDENT_AMBULATORY_CARE_PROVIDER_SITE_OTHER): Payer: No Typology Code available for payment source | Admitting: Physical Therapy

## 2021-06-21 ENCOUNTER — Encounter: Payer: Self-pay | Admitting: Physical Therapy

## 2021-06-21 DIAGNOSIS — R2689 Other abnormalities of gait and mobility: Secondary | ICD-10-CM

## 2021-06-21 DIAGNOSIS — M25561 Pain in right knee: Secondary | ICD-10-CM | POA: Diagnosis not present

## 2021-06-21 DIAGNOSIS — R600 Localized edema: Secondary | ICD-10-CM

## 2021-06-21 DIAGNOSIS — M6281 Muscle weakness (generalized): Secondary | ICD-10-CM | POA: Diagnosis not present

## 2021-06-21 DIAGNOSIS — M25661 Stiffness of right knee, not elsewhere classified: Secondary | ICD-10-CM | POA: Diagnosis not present

## 2021-06-21 DIAGNOSIS — R2681 Unsteadiness on feet: Secondary | ICD-10-CM

## 2021-06-21 NOTE — Therapy (Signed)
OUTPATIENT PHYSICAL THERAPY TREATMENT NOTE    Patient Name: Cheryl Tate MRN: [id number] DOB:1970/10/28, 51 y.o., female Today's Date: 06/21/2021   END OF SESSION:   PT End of Session - 06/21/21 1430     Visit Number 12    Number of Visits 20    Date for PT Re-Evaluation 07/20/21    Authorization Type U[employer]    Authorization Time Period [facility] CHOICE  NO VISIT LIMIT, PATIENT PAYS 100% UNTIL DED IS MET, DED $7500 [id number] MET, TERM DATE 09/19/2021    Progress Note Due on Visit 20    PT Start Time 1431    PT Stop Time 1525    PT Time Calculation (min) 54 min    Activity Tolerance Patient limited by pain    Behavior During Therapy [facility] for tasks assessed/performed                   Past Medical History:  Diagnosis Date   Anemia    Depression    Menorrhagia    Mood disorder ([place])    Von Willebrand's disease ([place])    followed by hemotologist Dr. Marin Olp   Past Surgical History:  Procedure Laterality Date   APPENDECTOMY  01/22/2004   DILATION AND CURETTAGE OF UTERUS  2002   x 2 in 2002   DILITATION & CURRETTAGE/HYSTROSCOPY WITH HYDROTHERMAL ABLATION N/A 11/22/2014   Procedure: HYSTEROSCOPY WITH HYDROTHERMAL ABLATION;  Surgeon: Dian Queen, MD;  Location: [place];  Service: Gynecology;  Laterality: N/A;   HYSTEROSCOPY WITH D & C  08-08-2014  in [place]   MULTIPLE TOOTH EXTRACTIONS     TOTAL KNEE ARTHROPLASTY Right 05/22/2021   Procedure: RIGHT TOTAL KNEE ARTHROPLASTY;  Surgeon: Meredith Pel, MD;  Location: [place];  Service: Orthopedics;  Laterality: Right;   Patient Active Problem List   Diagnosis Date Noted   Arthritis of right knee    S/P knee replacement 05/22/2021   BMI 33.0-33.9,adult 06/16/2015   Von Willebrand disease ([place]) 06/13/2015   Depression 12/29/2014   Rhinitis, allergic 12/28/2014    PCP: Eunice Blase, MD   REFERRING PROVIDER: Donella Stade, PA-C   REFERRING DIAG: [id number] (ICD-10-CM) - History of total knee  arthroplasty, unspecified laterality  ONSET DATE: 05/22/2021 Right TKA   THERAPY DIAG:  Stiffness of right knee, not elsewhere classified  Acute pain of right knee  Localized edema  Muscle weakness (generalized)  Other abnormalities of gait and mobility  Unsteadiness on feet  PERTINENT HISTORY: Von Willebrand disease, depression, Menorrhgia  PRECAUTIONS: None  SUBJECTIVE:  states the pain is not as bad overall today now that weather is better  PAIN:  NPRS scale:  4-5/10 today and ranging 2/10 to 7/10 Pain location: right knee all over.  Pain description: sharp, sore, cramps Aggravating factors: fall Relieving factors: ice, meds   OBJECTIVE:    PATIENT SURVEYS:  05/28/2021 FOTO 29% functional & target 67%   COGNITION: 05/28/2021 Overall cognitive status: Within functional limits for tasks assessed                EDEMA: 05/28/2021 RLE above knee 51.8cm around knee 48cm below knee  41.2cm LLE above knee 45.8cm around knee 40cm below knee 34.1cm   PALPATION: 05/28/2021 Bruising along medial knee & lower leg, tenderness over joint line, quads, hamstrings   LE ROM:   ROM P: Passive  A: Active Right 05/28/21 Right 05/31/21 Right 06/05/21 Right 06/06/21 Right 06/11/21 Right 06/14/21 Right 06/19/21  Hip flexion  Hip extension          Hip abduction          Hip adduction          Hip internal rotation          Hip external rotation          Knee flexion Supine P: 64* Seated  A: 64* Supine AROM heel slide:61 PROM overpressure in heel slide : 66   Supine AA: 73* Seated  P: 75*  Seated P: 79* Seated P: 75  Knee extension Supine P: -10* Seated  A: -31* Seated AROM LAQ: -21 Seated LAQ A: -14* Supine P: -9* Supine A: -8 Supine P: -7* Supine A: -8 P:-6  Ankle dorsiflexion          Ankle plantarflexion          Ankle inversion          Ankle eversion           (Blank rows = not tested)   LE MMT:   MMT Right 05/28/2021 Left 05/28/2021  Hip flexion       Hip extension      Hip abduction      Hip adduction      Hip internal rotation      Hip external rotation      Knee flexion 2/5 3   Knee extension 2/5  3  Ankle dorsiflexion      Ankle plantarflexion      Ankle inversion      Ankle eversion       (Blank rows = not tested)   GAIT: 05/28/2021 Distance walked: 76' Assistive device utilized:  4 footed walker level of assistance: SBA  verbal cues needed Comments: antalgic, right knee flexed in stance, limited to no increase flexion for swing, RLE abducted,    Functional Activities: Pt using LLE to move RLE in & out of bed.  PT min guard RLE and she was able to move RLE without assisting with LLE.    TODAY'S TREATMENT: 06/21/2021 Therapeutic Exercise:  Aerobic: Nu step L5 X 10 min total on handheld timer holding max knee flexion 5 sec each time  Machines for strength: Leg Press with seat lock removed 50# DL X15 reps with 5 sec holds at max ROM Then RLE only 25# 15 reps Supine:  Prone:  Seated: LAQ and active knee flexion with LLE+ opposing motion 20 reps with 2# on Rt. Seated AAROM knee flexion stretch 10 sec X 10  Standing: gastroc stretch on incline board 30 sec hold 2 reps   Heel raises on incline board 2X10 reps.   Squat holding sink 5 sec hold 15 reps.    Hamstring stretch with Rt leg on treadmill 30 sec X 3   Knee flexion lunge stretch with Rt foot on treadmill 10 sec X10 Manual Therapy: Rt knee PROM for flexion and extension to tolerance and contract relax  Modalities: vaso rt knee high compression 34* 10 min (knee extension stretch first 5 mina  06/20/2021 Therapeutic Exercise:  Aerobic: SciFit recumbent bike seat 8 rocking back & forth with 5 sec hold flexion  Machines for strength: Leg Press BLEs 93# 30 reps with 5 sec holds at max ROM. RLE only 50# 15 reps 2 sets.  Supine:  Prone:  Seated: LAQ and active knee flexion with LLE+ opposing motion 20 reps with 2# on Rt. Seated AAROM knee flexion stretch 10 sec X  10  Standing: gastroc stretch  on incline board 30 sec hold 2 reps   Heel raises on incline board 15 reps.   Squat holding sink 5 sec hold 15 reps.  Manual Therapy: Rt knee PROM for flexion and extension to tolerance and contract relax Self-care: PT reviewed need to exercise moving knee max ext & max flex every awake hour during day and position in ext or flex when resting seated or supine. Pt verbalized understanding.  Therapeutic Activity:  PT demo & verbal cues on gait with ext in stance & flexion in swing.  PT recommended using cane if necessary to walk without "stiff knee" pt verbalized & return demo understanding.  Modalities: vaso rt knee high compression 34* 10 min  06/19/2021 Therapeutic Exercise:  Aerobic: Nu step X 6 min for ROM L5  Machines for strength: Leg Press BLEs 87# 30 reps with 5 sec holds at max ROM. RLE only 43# 15 reps 2 sets.  Supine: AAROM heel slides 5 sec  X15 Prone:  Seated: LAQ and active knee flexion with LLE+ opposing motion 20 reps with 2# on Rt. Seated AAROM knee flexion stretch 10 sec X 10  Standing: Gait with SPC to no AD 50 feet X3 Manual Therapy: Rt knee PROM for flexion and extension to tolerance and contract relax Therapeutic Activity:   Modalities: vaso rt knee high compression 34* 10 min  06/14/2021 Therapeutic Exercise:  Aerobic: recumbent bike SciFit seat 7 rocking back & forth for flexion stretch for 8 min  Machines for strength: Leg Press BLEs 87# 30 reps with 5 sec holds at max ROM. RLE only 43# 15 reps 2 sets. PT manual assist at end ranges for increase in ROM.  Supine: SAQ 2# 15 reps Prone:  Seated: LAQ and active knee flexion with LLE+ opposing motion 20 reps.   Standing:  Neuromuscular Re-education: tandem stance on foam 30 sec RLE in front & in back 2 reps ea.   SLS on foam up to 4 sec for 5 reps. Then SLS on floor up to [place]. Manual Therapy: Rt knee PROM for flexion and extension to tolerance and contract relax Therapeutic Activity:    Self Care: PT discussed using pain meds for management.  Pt verbalized understanding.  Trigger Point Dry Needling:  Modalities: vaso rt knee high compression 34* 10 min with elevation and muscle activity (quad sets & alphabet)   HOME EXERCISE PROGRAM: Access Code: [email or web address] URL: [email or web address] Date: 06/19/2021 Prepared by: Elsie Ra  Exercises - Quad Setting and Stretching  - 2-4 x daily - 7 x weekly - 5-10 sets - 10 reps - prop 5-10 minutes & quad set5 seconds hold - Supine Heel Slide with Strap  - 2-3 x daily - 7 x weekly - 2-3 sets - 10 reps - 5 seconds hold - Seated Knee Flexion Stretch  - 2 x daily - 6 x weekly - 1-2 sets - 10 reps - 5 sec hold - Seated straight leg lifts  - 2-3 x daily - 7 x weekly - 2-3 sets - 10 reps - 5 seconds hold - Seated Hamstring Stretch with Strap  - 2-4 x daily - 7 x weekly - 1 sets - 3 reps - 20-30 seconds hold - Sit to Stand with Counter Support  - 2 x daily - 6 x weekly - 1-2 sets - 10 reps   ASSESSMENT:   CLINICAL IMPRESSION: She relays better compliance with HEP overall. We continued to prioritize ROM as she is still limited with this in her Rt  knee.    OBJECTIVE IMPAIRMENTS Abnormal gait, decreased activity tolerance, decreased balance, decreased knowledge of condition, decreased knowledge of use of DME, decreased mobility, difficulty walking, decreased ROM, decreased strength, increased edema, increased muscle spasms, impaired flexibility, postural dysfunction, and pain.    ACTIVITY LIMITATIONS community activity, driving, and personal recreation like Yoga & walking dogs .    PERSONAL FACTORS 1-2 comorbidities: see PMH  are also affecting patient's functional outcome.    REHAB POTENTIAL: Good   CLINICAL DECISION MAKING: Stable/uncomplicated   EVALUATION COMPLEXITY: Low     GOALS: Goals reviewed with patient? Yes   SHORT TERM GOALS: Target date: 06/22/2021   Patient independent and verbalizes compliance with  initial HEP Baseline: SEE OBJECTIVE DATA Goal status: on going 05/29/2021   2.  Patient reports 50% improvement in right knee pain. Baseline: SEE OBJECTIVE DATA Goal status: on going 05/29/2021   3.  PROM right knee extension -3* to flexion 90* Baseline: SEE OBJECTIVE DATA Goal status: on going 05/29/2021   LONG TERM GOALS: Target date: 07/20/2021   Patient will improve FOTO score to 67% Baseline: SEE OBJECTIVE DATA Goal status: INITIAL   2.  Patient reports right knee pain </= 2/10 with standing & gait activities.  Baseline: SEE OBJECTIVE DATA Goal status: INITIAL   3.  Right Knee PROM 0* extension to 110* flexion Baseline: SEE OBJECTIVE DATA Goal status: INITIAL   4.  Right Knee AROM seated -3* extension to 100* flexion Baseline: SEE OBJECTIVE DATA Goal status: INITIAL   5.  Patient ambulates >500' community distances including negotiating ramps, curbs & stairs without device independently. Baseline: SEE OBJECTIVE DATA Goal status: INITIAL   6.  Patient reports ability to perform Yoga & walk her dogs. Baseline: SEE OBJECTIVE DATA Goal status: INITIAL   PLAN: PT FREQUENCY: 3x/week for 4 weeks then 2x/wk for 4 weeks.   PT DURATION: 8 weeks   PLANNED INTERVENTIONS: Therapeutic exercises, Therapeutic activity, Neuromuscular re-education, Balance training, Gait training, Patient/Family education, Joint mobilization, Stair training, Vestibular training, DME instructions, Electrical stimulation, Cryotherapy, Moist heat, scar mobilization, Taping, Vasopneumatic device, and Manual therapy   PLAN FOR NEXT SESSION: check how HEP & frequent movement are going.  continue manual therapy, therapeutic exercise progressing range & function, vaso to end  Elsie Ra, PT, DPT 06/21/21 3:18 PM

## 2021-06-22 ENCOUNTER — Encounter: Payer: No Typology Code available for payment source | Admitting: Rehabilitative and Restorative Service Providers"

## 2021-06-23 ENCOUNTER — Other Ambulatory Visit: Payer: Self-pay

## 2021-06-23 ENCOUNTER — Emergency Department (HOSPITAL_COMMUNITY)
Admission: EM | Admit: 2021-06-23 | Discharge: 2021-06-24 | Disposition: A | Discharge status: Home / Self Care | Payer: No Typology Code available for payment source | Attending: Emergency Medicine | Admitting: Emergency Medicine

## 2021-06-23 DIAGNOSIS — Z96659 Presence of unspecified artificial knee joint: Secondary | ICD-10-CM | POA: Insufficient documentation

## 2021-06-23 DIAGNOSIS — Z5321 Procedure and treatment not carried out due to patient leaving prior to being seen by health care provider: Secondary | ICD-10-CM | POA: Diagnosis not present

## 2021-06-23 DIAGNOSIS — R519 Headache, unspecified: Secondary | ICD-10-CM | POA: Diagnosis not present

## 2021-06-23 LAB — CBC
HCT: 34.3 % — ABNORMAL LOW (ref 36.0–46.0)
Hemoglobin: 11.1 g/dL — ABNORMAL LOW (ref 12.0–15.0)
MCH: 30.3 pg (ref 26.0–34.0)
MCHC: 32.4 g/dL (ref 30.0–36.0)
MCV: 93.7 fL (ref 80.0–100.0)
Platelets: 374 10*3/uL (ref 150–400)
RBC: 3.66 MIL/uL — ABNORMAL LOW (ref 3.87–5.11)
RDW: 12.7 % (ref 11.5–15.5)
WBC: 6.7 10*3/uL (ref 4.0–10.5)
nRBC: 0 % (ref 0.0–0.2)

## 2021-06-23 NOTE — ED Triage Notes (Signed)
Pt presents with severe headache that is all over her head and into her neck.  Described as pressure that she "cannot take"  worse with lying down.  Recent knee replacement surgery with no difficulties in that recovery.

## 2021-06-24 LAB — COMPREHENSIVE METABOLIC PANEL
ALT: 21 U/L (ref 0–44)
AST: 20 U/L (ref 15–41)
Albumin: 4.3 g/dL (ref 3.5–5.0)
Alkaline Phosphatase: 60 U/L (ref 38–126)
Anion gap: 9 (ref 5–15)
BUN: 10 mg/dL (ref 6–20)
CO2: 23 mmol/L (ref 22–32)
Calcium: 9 mg/dL (ref 8.9–10.3)
Chloride: 103 mmol/L (ref 98–111)
Creatinine, Ser: 0.46 mg/dL (ref 0.44–1.00)
GFR, Estimated: >60 mL/min (ref >60)
Glucose, Bld: 104 mg/dL — ABNORMAL HIGH (ref 70–99)
Potassium: 3.9 mmol/L (ref 3.5–5.1)
Sodium: 135 mmol/L (ref 135–145)
Total Bilirubin: 0.3 mg/dL (ref 0.3–1.2)
Total Protein: 7.1 g/dL (ref 6.5–8.1)

## 2021-06-24 NOTE — ED Notes (Signed)
Pt called for vitals multiple times, no response. Pt not seen in the lobby

## 2021-06-26 ENCOUNTER — Encounter: Payer: Self-pay | Admitting: Physical Therapy

## 2021-06-26 ENCOUNTER — Ambulatory Visit (INDEPENDENT_AMBULATORY_CARE_PROVIDER_SITE_OTHER): Payer: No Typology Code available for payment source | Admitting: Physical Therapy

## 2021-06-26 ENCOUNTER — Other Ambulatory Visit: Payer: Self-pay | Admitting: Surgical

## 2021-06-26 DIAGNOSIS — M25561 Pain in right knee: Secondary | ICD-10-CM

## 2021-06-26 DIAGNOSIS — M6281 Muscle weakness (generalized): Secondary | ICD-10-CM | POA: Diagnosis not present

## 2021-06-26 DIAGNOSIS — R2689 Other abnormalities of gait and mobility: Secondary | ICD-10-CM

## 2021-06-26 DIAGNOSIS — M25661 Stiffness of right knee, not elsewhere classified: Secondary | ICD-10-CM | POA: Diagnosis not present

## 2021-06-26 DIAGNOSIS — R600 Localized edema: Secondary | ICD-10-CM | POA: Diagnosis not present

## 2021-06-26 MED ORDER — OXYCODONE HCL 5 MG PO TABS: 5.0000 mg | ORAL_TABLET | Freq: Two times a day (BID) | ORAL | 0 refills | Status: DC | PRN

## 2021-06-26 NOTE — Therapy (Signed)
OUTPATIENT PHYSICAL THERAPY TREATMENT NOTE    Patient Name: Cheryl Tate MRN: [id number] DOB:1970/04/24, 51 y.o., female Today's Date: 06/26/2021   END OF SESSION:   PT End of Session - 06/26/21 1612     Visit Number 13    Number of Visits 20    Date for PT Re-Evaluation 07/20/21    Authorization Type U[employer]    Authorization Time Period [facility] CHOICE  NO VISIT LIMIT, PATIENT PAYS 100% UNTIL DED IS MET, DED $7500 [id number] MET, TERM DATE 09/19/2021    Progress Note Due on Visit 20    PT Start Time 1602    PT Stop Time 1658    PT Time Calculation (min) 56 min    Activity Tolerance Patient limited by pain    Behavior During Therapy [facility] for tasks assessed/performed                   Past Medical History:  Diagnosis Date   Anemia    Depression    Menorrhagia    Mood disorder ([place])    Von Willebrand's disease ([place])    followed by hemotologist Dr. Marin Olp   Past Surgical History:  Procedure Laterality Date   APPENDECTOMY  01/22/2004   DILATION AND CURETTAGE OF UTERUS  2002   x 2 in 2002   DILITATION & CURRETTAGE/HYSTROSCOPY WITH HYDROTHERMAL ABLATION N/A 11/22/2014   Procedure: HYSTEROSCOPY WITH HYDROTHERMAL ABLATION;  Surgeon: Dian Queen, MD;  Location: [place];  Service: Gynecology;  Laterality: N/A;   HYSTEROSCOPY WITH D & C  08-08-2014  in [place]   MULTIPLE TOOTH EXTRACTIONS     TOTAL KNEE ARTHROPLASTY Right 05/22/2021   Procedure: RIGHT TOTAL KNEE ARTHROPLASTY;  Surgeon: Meredith Pel, MD;  Location: [place];  Service: Orthopedics;  Laterality: Right;   Patient Active Problem List   Diagnosis Date Noted   Arthritis of right knee    S/P knee replacement 05/22/2021   BMI 33.0-33.9,adult 06/16/2015   Von Willebrand disease ([place]) 06/13/2015   Depression 12/29/2014   Rhinitis, allergic 12/28/2014    PCP: Eunice Blase, MD   REFERRING PROVIDER: Donella Stade, PA-C   REFERRING DIAG: [id number] (ICD-10-CM) - History of total knee  arthroplasty, unspecified laterality  ONSET DATE: 05/22/2021 Right TKA   THERAPY DIAG:  Stiffness of right knee, not elsewhere classified  Acute pain of right knee  Localized edema  Muscle weakness (generalized)  Other abnormalities of gait and mobility  PERTINENT HISTORY: Von Willebrand disease, depression, Menorrhgia  PRECAUTIONS: None  SUBJECTIVE:  states the pain is not bad and she is not taking pain meds.  PAIN:  NPRS scale:  2/10 Pain location: right knee all over.  Pain description: sharp, sore, cramps Aggravating factors: fall Relieving factors: ice, meds   OBJECTIVE:    PATIENT SURVEYS:  05/28/2021 FOTO 29% functional & target 67%   COGNITION: 05/28/2021 Overall cognitive status: Within functional limits for tasks assessed                EDEMA: 05/28/2021 RLE above knee 51.8cm around knee 48cm below knee  41.2cm LLE above knee 45.8cm around knee 40cm below knee 34.1cm   PALPATION: 05/28/2021 Bruising along medial knee & lower leg, tenderness over joint line, quads, hamstrings   LE ROM:   ROM P: Passive  A: Active Right 05/28/21 Right 05/31/21 Right 06/05/21 Right 06/06/21 Right 06/11/21 Right 06/14/21 Right 06/19/21 Right 06/26/21  Hip flexion  Hip extension           Hip abduction           Hip adduction           Hip internal rotation           Hip external rotation           Knee flexion Supine P: 64* Seated  A: 64* Supine AROM heel slide:61 PROM overpressure in heel slide : 66   Supine AA: 73* Seated  P: 75*  Seated P: 79* Seated P: 75 Seated P: 70 with muscle guarding limiting  Knee extension Supine P: -10* Seated  A: -31* Seated AROM LAQ: -21 Seated LAQ A: -14* Supine P: -9* Supine A: -8 Supine P: -7* Supine A: -8 P:-6 Supine A:-8 P:-5  Ankle dorsiflexion           Ankle plantarflexion           Ankle inversion           Ankle eversion            (Blank rows = not tested)   LE MMT:   MMT Right 05/28/2021 Left 05/28/2021   Hip flexion      Hip extension      Hip abduction      Hip adduction      Hip internal rotation      Hip external rotation      Knee flexion 2/5 3   Knee extension 2/5  3  Ankle dorsiflexion      Ankle plantarflexion      Ankle inversion      Ankle eversion       (Blank rows = not tested)   GAIT: 05/28/2021 Distance walked: 76' Assistive device utilized:  4 footed walker level of assistance: SBA  verbal cues needed Comments: antalgic, right knee flexed in stance, limited to no increase flexion for swing, RLE abducted,    Functional Activities: Pt using LLE to move RLE in & out of bed.  PT min guard RLE and she was able to move RLE without assisting with LLE.    TODAY'S TREATMENT: 6/6/232023 Therapeutic Exercise:  Aerobic: Scit fit bike rocking for knee ROM X 3 min then stopped due to pain and moved to Nu step L6 X 7 min  Machines for strength: Leg Press with seat lock removed RLE only 37# 20 reps Supine:  Prone:  Seated: LAQ and active knee flexion with LLE+ opposing motion 2.5 minutes with 3# on Rt.  Seated AAROM knee flexion stretch 10 sec X 10  Standing: gastroc stretch on incline board 30 sec hold 3 reps.   Squat holding sink 5 sec hold 15 reps.    Knee flexion lunge stretch with Rt foot on treadmill 10 sec X10   Step ups 6 inch step X 10 with Rt LE with one UE support Manual Therapy: Rt knee PROM for flexion and extension to tolerance and contract relax  Modalities: vaso rt knee high compression 34* 10 min (knee extension stretch last 5 min)  06/21/2021 Therapeutic Exercise:  Aerobic: Nu step L5 X 10 min total on handheld timer holding max knee flexion 5 sec each time  Machines for strength: Leg Press with seat lock removed 50# DL X15 reps with 5 sec holds at max ROM Then RLE only 25# 15 reps Supine:  Prone:  Seated: LAQ and active knee flexion with LLE+ opposing motion 20 reps with 2# on Rt.  Seated AAROM knee flexion stretch 10 sec X 10  Standing: gastroc stretch on  incline board 30 sec hold 2 reps   Heel raises on incline board 2X10 reps.   Squat holding sink 5 sec hold 15 reps.    Hamstring stretch with Rt leg on treadmill 30 sec X 3   Knee flexion lunge stretch with Rt foot on treadmill 10 sec X10 Manual Therapy: Rt knee PROM for flexion and extension to tolerance and contract relax  Modalities: vaso rt knee high compression 34* 10 min (knee extension stretch first 5 mina  06/20/2021 Therapeutic Exercise:  Aerobic: SciFit recumbent bike seat 8 rocking back & forth with 5 sec hold flexion  Machines for strength: Leg Press BLEs 93# 30 reps with 5 sec holds at max ROM. RLE only 50# 15 reps 2 sets.  Supine:  Prone:  Seated: LAQ and active knee flexion with LLE+ opposing motion 20 reps with 2# on Rt. Seated AAROM knee flexion stretch 10 sec X 10  Standing: gastroc stretch on incline board 30 sec hold 2 reps   Heel raises on incline board 15 reps.   Squat holding sink 5 sec hold 15 reps.  Manual Therapy: Rt knee PROM for flexion and extension to tolerance and contract relax Self-care: PT reviewed need to exercise moving knee max ext & max flex every awake hour during day and position in ext or flex when resting seated or supine. Pt verbalized understanding.  Therapeutic Activity:  PT demo & verbal cues on gait with ext in stance & flexion in swing.  PT recommended using cane if necessary to walk without "stiff knee" pt verbalized & return demo understanding.  Modalities: vaso rt knee high compression 34* 10 min  06/19/2021 Therapeutic Exercise:  Aerobic: Nu step X 6 min for ROM L5  Machines for strength: Leg Press BLEs 87# 30 reps with 5 sec holds at max ROM. RLE only 43# 15 reps 2 sets.  Supine: AAROM heel slides 5 sec  X15 Prone:  Seated: LAQ and active knee flexion with LLE+ opposing motion 20 reps with 2# on Rt. Seated AAROM knee flexion stretch 10 sec X 10  Standing: Gait with SPC to no AD 50 feet X3 Manual Therapy: Rt knee PROM for flexion and  extension to tolerance and contract relax Therapeutic Activity:   Modalities: vaso rt knee high compression 34* 10 min  06/14/2021 Therapeutic Exercise:  Aerobic: recumbent bike SciFit seat 7 rocking back & forth for flexion stretch for 8 min  Machines for strength: Leg Press BLEs 87# 30 reps with 5 sec holds at max ROM. RLE only 43# 15 reps 2 sets. PT manual assist at end ranges for increase in ROM.  Supine: SAQ 2# 15 reps Prone:  Seated: LAQ and active knee flexion with LLE+ opposing motion 20 reps.   Standing:  Neuromuscular Re-education: tandem stance on foam 30 sec RLE in front & in back 2 reps ea.   SLS on foam up to 4 sec for 5 reps. Then SLS on floor up to [place]. Manual Therapy: Rt knee PROM for flexion and extension to tolerance and contract relax Therapeutic Activity:   Self Care: PT discussed using pain meds for management.  Pt verbalized understanding.  Trigger Point Dry Needling:  Modalities: vaso rt knee high compression 34* 10 min with elevation and muscle activity (quad sets & alphabet)   HOME EXERCISE PROGRAM: Access Code: [email or web address] URL: [email or web address] Date: 06/19/2021 Prepared by: Elsie Ra  Exercises -  Quad Setting and Stretching  - 2-4 x daily - 7 x weekly - 5-10 sets - 10 reps - prop 5-10 minutes & quad set5 seconds hold - Supine Heel Slide with Strap  - 2-3 x daily - 7 x weekly - 2-3 sets - 10 reps - 5 seconds hold - Seated Knee Flexion Stretch  - 2 x daily - 6 x weekly - 1-2 sets - 10 reps - 5 sec hold - Seated straight leg lifts  - 2-3 x daily - 7 x weekly - 2-3 sets - 10 reps - 5 seconds hold - Seated Hamstring Stretch with Strap  - 2-4 x daily - 7 x weekly - 1 sets - 3 reps - 20-30 seconds hold - Sit to Stand with Counter Support  - 2 x daily - 6 x weekly - 1-2 sets - 10 reps   ASSESSMENT:   CLINICAL IMPRESSION: She admits to only doing HEP one time per day, she was again reminded that she needs to work on ROM frequently every 1-2  hours as her knee is very stiff and she actually lost flexion ROM today with updated measurement.   OBJECTIVE IMPAIRMENTS Abnormal gait, decreased activity tolerance, decreased balance, decreased knowledge of condition, decreased knowledge of use of DME, decreased mobility, difficulty walking, decreased ROM, decreased strength, increased edema, increased muscle spasms, impaired flexibility, postural dysfunction, and pain.    ACTIVITY LIMITATIONS community activity, driving, and personal recreation like Yoga & walking dogs .    PERSONAL FACTORS 1-2 comorbidities: see PMH  are also affecting patient's functional outcome.    REHAB POTENTIAL: Good   CLINICAL DECISION MAKING: Stable/uncomplicated   EVALUATION COMPLEXITY: Low     GOALS: Goals reviewed with patient? Yes   SHORT TERM GOALS: Target date: 06/22/2021   Patient independent and verbalizes compliance with initial HEP Baseline: SEE OBJECTIVE DATA Goal status: on going 05/29/2021   2.  Patient reports 50% improvement in right knee pain. Baseline: SEE OBJECTIVE DATA Goal status: on going 05/29/2021   3.  PROM right knee extension -3* to flexion 90* Baseline: SEE OBJECTIVE DATA Goal status: on going 05/29/2021   LONG TERM GOALS: Target date: 07/20/2021   Patient will improve FOTO score to 67% Baseline: SEE OBJECTIVE DATA Goal status: INITIAL   2.  Patient reports right knee pain </= 2/10 with standing & gait activities.  Baseline: SEE OBJECTIVE DATA Goal status: INITIAL   3.  Right Knee PROM 0* extension to 110* flexion Baseline: SEE OBJECTIVE DATA Goal status: INITIAL   4.  Right Knee AROM seated -3* extension to 100* flexion Baseline: SEE OBJECTIVE DATA Goal status: INITIAL   5.  Patient ambulates >500' community distances including negotiating ramps, curbs & stairs without device independently. Baseline: SEE OBJECTIVE DATA Goal status: INITIAL   6.  Patient reports ability to perform Yoga & walk her dogs. Baseline: SEE  OBJECTIVE DATA Goal status: INITIAL   PLAN: PT FREQUENCY: 3x/week for 4 weeks then 2x/wk for 4 weeks.   PT DURATION: 8 weeks   PLANNED INTERVENTIONS: Therapeutic exercises, Therapeutic activity, Neuromuscular re-education, Balance training, Gait training, Patient/Family education, Joint mobilization, Stair training, Vestibular training, DME instructions, Electrical stimulation, Cryotherapy, Moist heat, scar mobilization, Taping, Vasopneumatic device, and Manual therapy   PLAN FOR NEXT SESSION:  continue to encourage more frequent ROM at home, continue manual therapy, therapeutic exercise progressing range & function, vaso to end  Elsie Ra, PT, DPT 06/26/21 4:50 PM

## 2021-06-27 ENCOUNTER — Ambulatory Visit
Admission: RE | Admit: 2021-06-27 | Discharge: 2021-06-27 | Disposition: A | Discharge status: Home / Self Care | Payer: No Typology Code available for payment source | Source: Ambulatory Visit | Attending: Nurse Practitioner | Admitting: Nurse Practitioner

## 2021-06-27 ENCOUNTER — Other Ambulatory Visit: Payer: Self-pay | Admitting: Nurse Practitioner

## 2021-06-27 DIAGNOSIS — N6310 Unspecified lump in the right breast, unspecified quadrant: Secondary | ICD-10-CM

## 2021-06-28 ENCOUNTER — Ambulatory Visit (INDEPENDENT_AMBULATORY_CARE_PROVIDER_SITE_OTHER): Payer: No Typology Code available for payment source | Admitting: Physical Therapy

## 2021-06-28 ENCOUNTER — Encounter: Payer: Self-pay | Admitting: Physical Therapy

## 2021-06-28 DIAGNOSIS — M6281 Muscle weakness (generalized): Secondary | ICD-10-CM | POA: Diagnosis not present

## 2021-06-28 DIAGNOSIS — M25661 Stiffness of right knee, not elsewhere classified: Secondary | ICD-10-CM | POA: Diagnosis not present

## 2021-06-28 DIAGNOSIS — R2689 Other abnormalities of gait and mobility: Secondary | ICD-10-CM

## 2021-06-28 DIAGNOSIS — M25561 Pain in right knee: Secondary | ICD-10-CM | POA: Diagnosis not present

## 2021-06-28 DIAGNOSIS — R600 Localized edema: Secondary | ICD-10-CM

## 2021-06-28 NOTE — Therapy (Signed)
OUTPATIENT PHYSICAL THERAPY TREATMENT NOTE    Patient Name: Cheryl Tate MRN: [id number] DOB:1970-10-04, 51 y.o., female Today's Date: 06/28/2021   END OF SESSION:   PT End of Session - 06/28/21 1624     Visit Number 14    Number of Visits 20    Date for PT Re-Evaluation 07/20/21    Authorization Type U[employer]    Authorization Time Period [facility] CHOICE  NO VISIT LIMIT, PATIENT PAYS 100% UNTIL DED IS MET, DED $7500 [id number] MET, TERM DATE 09/19/2021    Progress Note Due on Visit 20    PT Start Time 1602    PT Stop Time 1645    PT Time Calculation (min) 43 min    Activity Tolerance Patient limited by pain    Behavior During Therapy [facility] for tasks assessed/performed                   Past Medical History:  Diagnosis Date   Anemia    Depression    Menorrhagia    Mood disorder ([place])    Von Willebrand's disease ([place])    followed by hemotologist Dr. Marin Olp   Past Surgical History:  Procedure Laterality Date   APPENDECTOMY  01/22/2004   DILATION AND CURETTAGE OF UTERUS  2002   x 2 in 2002   DILITATION & CURRETTAGE/HYSTROSCOPY WITH HYDROTHERMAL ABLATION N/A 11/22/2014   Procedure: HYSTEROSCOPY WITH HYDROTHERMAL ABLATION;  Surgeon: Dian Queen, MD;  Location: [place];  Service: Gynecology;  Laterality: N/A;   HYSTEROSCOPY WITH D & C  08-08-2014  in [place]   MULTIPLE TOOTH EXTRACTIONS     TOTAL KNEE ARTHROPLASTY Right 05/22/2021   Procedure: RIGHT TOTAL KNEE ARTHROPLASTY;  Surgeon: Meredith Pel, MD;  Location: [place];  Service: Orthopedics;  Laterality: Right;   Patient Active Problem List   Diagnosis Date Noted   Arthritis of right knee    S/P knee replacement 05/22/2021   BMI 33.0-33.9,adult 06/16/2015   Von Willebrand disease ([place]) 06/13/2015   Depression 12/29/2014   Rhinitis, allergic 12/28/2014    PCP: Eunice Blase, MD   REFERRING PROVIDER: Donella Stade, PA-C   REFERRING DIAG: [phone] (ICD-10-CM) - History of total knee  arthroplasty, unspecified laterality  ONSET DATE: 05/22/2021 Right TKA   THERAPY DIAG:  Stiffness of right knee, not elsewhere classified  Acute pain of right knee  Localized edema  Muscle weakness (generalized)  Other abnormalities of gait and mobility  PERTINENT HISTORY: Von Willebrand disease, depression, Menorrhgia  PRECAUTIONS: None  SUBJECTIVE:  states the pain is not bad, she feels she is ready to drive. She says she has been doing exercises more often like PT has recommended  PAIN:  NPRS scale:  2/10 Pain location: right knee all over.  Pain description: sharp, sore, cramps Aggravating factors: fall Relieving factors: ice, meds   OBJECTIVE:    PATIENT SURVEYS:  05/28/2021 FOTO 29% functional & target 67%   COGNITION: 05/28/2021 Overall cognitive status: Within functional limits for tasks assessed                EDEMA: 05/28/2021 RLE above knee 51.8cm around knee 48cm below knee  41.2cm LLE above knee 45.8cm around knee 40cm below knee 34.1cm   PALPATION: 05/28/2021 Bruising along medial knee & lower leg, tenderness over joint line, quads, hamstrings   LE ROM:   ROM P: Passive  A: Active Right 05/28/21 Right 05/31/21 Right 06/05/21 Right 06/06/21 Right 06/11/21 Right 06/14/21 Right 06/19/21 Right 06/26/21  Right 06/28/21  Hip flexion            Hip extension            Hip abduction            Hip adduction            Hip internal rotation            Hip external rotation            Knee flexion Supine P: 64* Seated  A: 64* Supine AROM heel slide:61 PROM overpressure in heel slide : 66   Supine AA: 73* Seated  P: 75*  Seated P: 79* Seated P: 75 Seated P: 70 with muscle guarding limiting Seated P:78 after manual therapy  Knee extension Supine P: -10* Seated  A: -31* Seated AROM LAQ: -21 Seated LAQ A: -14* Supine P: -9* Supine A: -8 Supine P: -7* Supine A: -8 P:-6 Supine A:-8 P:-5   Ankle dorsiflexion            Ankle plantarflexion             Ankle inversion            Ankle eversion             (Blank rows = not tested)   LE MMT:   MMT Right 05/28/2021 Left 05/28/2021  Hip flexion      Hip extension      Hip abduction      Hip adduction      Hip internal rotation      Hip external rotation      Knee flexion 2/5 3   Knee extension 2/5  3  Ankle dorsiflexion      Ankle plantarflexion      Ankle inversion      Ankle eversion       (Blank rows = not tested)   GAIT: 05/28/2021 Distance walked: 104' Assistive device utilized:  4 footed walker level of assistance: SBA  verbal cues needed Comments: antalgic, right knee flexed in stance, limited to no increase flexion for swing, RLE abducted,    Functional Activities: Pt using LLE to move RLE in & out of bed.  PT min guard RLE and she was able to move RLE without assisting with LLE.    TODAY'S TREATMENT:  6/8/232023 Therapeutic Exercise:  Aerobic:Nu step L5 X 6 min  Machines for strength:  Supine:  Prone:  Seated: LAQ and active knee flexion with LLE+ opposing motion 3 minutes with 3# on Rt.  Seated AAROM knee flexion stretch 10 sec X 10 Seated hamstring stretch 30 sec X 3  Standing:    Squat holding sink 5 sec hold 15 reps.    Knee flexion lunge stretch with Rt foot on treadmill 10 sec X10    Manual Therapy: Rt knee PROM for flexion and extension to tolerance and contract relax  Modalities: vaso rt knee high compression 34* 10 min (knee extension stretch first 5 min) 6/6/232023 Therapeutic Exercise:  Aerobic: Scit fit bike rocking for knee ROM X 3 min then stopped due to pain and moved to Nu step L6 X 7 min  Machines for strength: Leg Press with seat lock removed RLE only 37# 20 reps Supine:  Prone:  Seated: LAQ and active knee flexion with LLE+ opposing motion 2.5 minutes with 3# on Rt.  Seated AAROM knee flexion stretch 10 sec X 10  Standing: gastroc stretch on incline board 30 sec  hold 3 reps.   Squat holding sink 5 sec hold 15 reps.    Knee flexion  lunge stretch with Rt foot on treadmill 10 sec X10   Step ups 6 inch step X 10 with Rt LE with one UE support Manual Therapy: Rt knee PROM for flexion and extension to tolerance and contract relax  Modalities: vaso rt knee high compression 34* 10 min (knee extension stretch last 5 min)  06/21/2021 Therapeutic Exercise:  Aerobic: Nu step L5 X 10 min total on handheld timer holding max knee flexion 5 sec each time  Machines for strength: Leg Press with seat lock removed 50# DL X15 reps with 5 sec holds at max ROM Then RLE only 25# 15 reps Supine:  Prone:  Seated: LAQ and active knee flexion with LLE+ opposing motion 20 reps with 2# on Rt. Seated AAROM knee flexion stretch 10 sec X 10  Standing: gastroc stretch on incline board 30 sec hold 2 reps   Heel raises on incline board 2X10 reps.   Squat holding sink 5 sec hold 15 reps.    Hamstring stretch with Rt leg on treadmill 30 sec X 3   Knee flexion lunge stretch with Rt foot on treadmill 10 sec X10 Manual Therapy: Rt knee PROM for flexion and extension to tolerance and contract relax  Modalities: vaso rt knee high compression 34* 10 min (knee extension stretch first 5 mina  06/20/2021 Therapeutic Exercise:  Aerobic: SciFit recumbent bike seat 8 rocking back & forth with 5 sec hold flexion  Machines for strength: Leg Press BLEs 93# 30 reps with 5 sec holds at max ROM. RLE only 50# 15 reps 2 sets.  Supine:  Prone:  Seated: LAQ and active knee flexion with LLE+ opposing motion 20 reps with 2# on Rt. Seated AAROM knee flexion stretch 10 sec X 10  Standing: gastroc stretch on incline board 30 sec hold 2 reps   Heel raises on incline board 15 reps.   Squat holding sink 5 sec hold 15 reps.  Manual Therapy: Rt knee PROM for flexion and extension to tolerance and contract relax Self-care: PT reviewed need to exercise moving knee max ext & max flex every awake hour during day and position in ext or flex when resting seated or supine. Pt verbalized  understanding.  Therapeutic Activity:  PT demo & verbal cues on gait with ext in stance & flexion in swing.  PT recommended using cane if necessary to walk without "stiff knee" pt verbalized & return demo understanding.  Modalities: vaso rt knee high compression 34* 10 min  06/19/2021 Therapeutic Exercise:  Aerobic: Nu step X 6 min for ROM L5  Machines for strength: Leg Press BLEs 87# 30 reps with 5 sec holds at max ROM. RLE only 43# 15 reps 2 sets.  Supine: AAROM heel slides 5 sec  X15 Prone:  Seated: LAQ and active knee flexion with LLE+ opposing motion 20 reps with 2# on Rt. Seated AAROM knee flexion stretch 10 sec X 10  Standing: Gait with SPC to no AD 50 feet X3 Manual Therapy: Rt knee PROM for flexion and extension to tolerance and contract relax Therapeutic Activity:   Modalities: vaso rt knee high compression 34* 10 min  06/14/2021 Therapeutic Exercise:  Aerobic: recumbent bike SciFit seat 7 rocking back & forth for flexion stretch for 8 min  Machines for strength: Leg Press BLEs 87# 30 reps with 5 sec holds at max ROM. RLE only 43# 15 reps 2  sets. PT manual assist at end ranges for increase in ROM.  Supine: SAQ 2# 15 reps Prone:  Seated: LAQ and active knee flexion with LLE+ opposing motion 20 reps.   Standing:  Neuromuscular Re-education: tandem stance on foam 30 sec RLE in front & in back 2 reps ea.   SLS on foam up to 4 sec for 5 reps. Then SLS on floor up to [place]. Manual Therapy: Rt knee PROM for flexion and extension to tolerance and contract relax Therapeutic Activity:   Self Care: PT discussed using pain meds for management.  Pt verbalized understanding.  Trigger Point Dry Needling:  Modalities: vaso rt knee high compression 34* 10 min with elevation and muscle activity (quad sets & alphabet)   HOME EXERCISE PROGRAM: Access Code: [email or web address] URL: [email or web address] Date: 06/19/2021 Prepared by: Elsie Ra  Exercises - Quad Setting and Stretching  -  2-4 x daily - 7 x weekly - 5-10 sets - 10 reps - prop 5-10 minutes & quad set5 seconds hold - Supine Heel Slide with Strap  - 2-3 x daily - 7 x weekly - 2-3 sets - 10 reps - 5 seconds hold - Seated Knee Flexion Stretch  - 2 x daily - 6 x weekly - 1-2 sets - 10 reps - 5 sec hold - Seated straight leg lifts  - 2-3 x daily - 7 x weekly - 2-3 sets - 10 reps - 5 seconds hold - Seated Hamstring Stretch with Strap  - 2-4 x daily - 7 x weekly - 1 sets - 3 reps - 20-30 seconds hold - Sit to Stand with Counter Support  - 2 x daily - 6 x weekly - 1-2 sets - 10 reps   ASSESSMENT:   CLINICAL IMPRESSION: I spent more time on manual therapy today as her ROM is slow to progress. She did some ROM improvement from last time and has been doing HEP more frequently like PT recommended. She does still lack significant knee ROM which PT will continue to work to improve. Gait and strength are progressing more easily for her.    OBJECTIVE IMPAIRMENTS Abnormal gait, decreased activity tolerance, decreased balance, decreased knowledge of condition, decreased knowledge of use of DME, decreased mobility, difficulty walking, decreased ROM, decreased strength, increased edema, increased muscle spasms, impaired flexibility, postural dysfunction, and pain.    ACTIVITY LIMITATIONS community activity, driving, and personal recreation like Yoga & walking dogs .    PERSONAL FACTORS 1-2 comorbidities: see PMH  are also affecting patient's functional outcome.    REHAB POTENTIAL: Good   CLINICAL DECISION MAKING: Stable/uncomplicated   EVALUATION COMPLEXITY: Low     GOALS: Goals reviewed with patient? Yes   SHORT TERM GOALS: Target date: 06/22/2021   Patient independent and verbalizes compliance with initial HEP Baseline: SEE OBJECTIVE DATA Goal status: on going 05/29/2021   2.  Patient reports 50% improvement in right knee pain. Baseline: SEE OBJECTIVE DATA Goal status: on going 05/29/2021   3.  PROM right knee extension  -3* to flexion 90* Baseline: SEE OBJECTIVE DATA Goal status: on going 05/29/2021   LONG TERM GOALS: Target date: 07/20/2021   Patient will improve FOTO score to 67% Baseline: SEE OBJECTIVE DATA Goal status: INITIAL   2.  Patient reports right knee pain </= 2/10 with standing & gait activities.  Baseline: SEE OBJECTIVE DATA Goal status: INITIAL   3.  Right Knee PROM 0* extension to 110* flexion Baseline: SEE OBJECTIVE DATA Goal status: INITIAL   4.  Right Knee AROM seated -3* extension to 100* flexion Baseline: SEE OBJECTIVE DATA Goal status: INITIAL   5.  Patient ambulates >500' community distances including negotiating ramps, curbs & stairs without device independently. Baseline: SEE OBJECTIVE DATA Goal status: INITIAL   6.  Patient reports ability to perform Yoga & walk her dogs. Baseline: SEE OBJECTIVE DATA Goal status: INITIAL   PLAN: PT FREQUENCY: 3x/week for 4 weeks then 2x/wk for 4 weeks.   PT DURATION: 8 weeks   PLANNED INTERVENTIONS: Therapeutic exercises, Therapeutic activity, Neuromuscular re-education, Balance training, Gait training, Patient/Family education, Joint mobilization, Stair training, Vestibular training, DME instructions, Electrical stimulation, Cryotherapy, Moist heat, scar mobilization, Taping, Vasopneumatic device, and Manual therapy   PLAN FOR NEXT SESSION:  continue to encourage more frequent ROM at home, continue manual therapy, ROM emphasis. vaso to end  Elsie Ra, PT, DPT 06/28/21 4:35 PM

## 2021-07-02 ENCOUNTER — Ambulatory Visit (INDEPENDENT_AMBULATORY_CARE_PROVIDER_SITE_OTHER): Payer: No Typology Code available for payment source | Admitting: Physical Therapy

## 2021-07-02 ENCOUNTER — Encounter: Payer: Self-pay | Admitting: Physical Therapy

## 2021-07-02 ENCOUNTER — Encounter: Payer: Self-pay | Admitting: Orthopedic Surgery

## 2021-07-02 DIAGNOSIS — M25661 Stiffness of right knee, not elsewhere classified: Secondary | ICD-10-CM | POA: Diagnosis not present

## 2021-07-02 DIAGNOSIS — M25561 Pain in right knee: Secondary | ICD-10-CM | POA: Diagnosis not present

## 2021-07-02 DIAGNOSIS — R2689 Other abnormalities of gait and mobility: Secondary | ICD-10-CM

## 2021-07-02 DIAGNOSIS — R2681 Unsteadiness on feet: Secondary | ICD-10-CM

## 2021-07-02 DIAGNOSIS — M6281 Muscle weakness (generalized): Secondary | ICD-10-CM | POA: Diagnosis not present

## 2021-07-02 DIAGNOSIS — R600 Localized edema: Secondary | ICD-10-CM

## 2021-07-02 NOTE — Therapy (Signed)
OUTPATIENT PHYSICAL THERAPY TREATMENT NOTE    Patient Name: Cheryl Tate MRN: [id number] DOB:August 27, 1970, 51 y.o., female Today's Date: 07/02/2021   END OF SESSION:   PT End of Session - 07/02/21 1009     Visit Number 15    Number of Visits 20    Date for PT Re-Evaluation 07/20/21    Authorization Type U[employer]    Authorization Time Period [facility] CHOICE  NO VISIT LIMIT, PATIENT PAYS 100% UNTIL DED IS MET, DED $7500 [id number] MET, TERM DATE 09/19/2021    Progress Note Due on Visit 20    PT Start Time 1010    PT Stop Time 1110    PT Time Calculation (min) 60 min    Activity Tolerance Patient limited by pain    Behavior During Therapy [facility] for tasks assessed/performed                    Past Medical History:  Diagnosis Date   Anemia    Depression    Menorrhagia    Mood disorder ([place])    Von Willebrand's disease ([place])    followed by hemotologist Dr. Marin Olp   Past Surgical History:  Procedure Laterality Date   APPENDECTOMY  01/22/2004   DILATION AND CURETTAGE OF UTERUS  2002   x 2 in 2002   DILITATION & CURRETTAGE/HYSTROSCOPY WITH HYDROTHERMAL ABLATION N/A 11/22/2014   Procedure: HYSTEROSCOPY WITH HYDROTHERMAL ABLATION;  Surgeon: Dian Queen, MD;  Location: [place];  Service: Gynecology;  Laterality: N/A;   HYSTEROSCOPY WITH D & C  08-08-2014  in [place]   MULTIPLE TOOTH EXTRACTIONS     TOTAL KNEE ARTHROPLASTY Right 05/22/2021   Procedure: RIGHT TOTAL KNEE ARTHROPLASTY;  Surgeon: Meredith Pel, MD;  Location: [place];  Service: Orthopedics;  Laterality: Right;   Patient Active Problem List   Diagnosis Date Noted   Arthritis of right knee    S/P knee replacement 05/22/2021   BMI 33.0-33.9,adult 06/16/2015   Von Willebrand disease ([place]) 06/13/2015   Depression 12/29/2014   Rhinitis, allergic 12/28/2014    PCP: Eunice Blase, MD   REFERRING PROVIDER: Donella Stade, PA-C   REFERRING DIAG: [phone] (ICD-10-CM) - History of total knee  arthroplasty, unspecified laterality  ONSET DATE: 05/22/2021 Right TKA   THERAPY DIAG:  Stiffness of right knee, not elsewhere classified  Acute pain of right knee  Localized edema  Muscle weakness (generalized)  Other abnormalities of gait and mobility  Unsteadiness on feet  PERTINENT HISTORY: Von Willebrand disease, depression, Menorrhgia  PRECAUTIONS: None  SUBJECTIVE:  She is off Oxy so she is driving using RLE to gas & break  PAIN:  NPRS scale:  2/10 ranging 0/10 - 5-6/10 Pain location: right knee all over.  Pain description: sharp, sore, cramps Aggravating factors: fall Relieving factors: ice, meds   OBJECTIVE:    PATIENT SURVEYS:  05/28/2021 FOTO 29% functional & target 67%   COGNITION: 05/28/2021 Overall cognitive status: Within functional limits for tasks assessed                EDEMA: 05/28/2021 RLE above knee 51.8cm around knee 48cm below knee  41.2cm LLE above knee 45.8cm around knee 40cm below knee 34.1cm   PALPATION: 05/28/2021 Bruising along medial knee & lower leg, tenderness over joint line, quads, hamstrings   LE ROM:   ROM P: Passive  A: Active Right 05/28/21 Right 05/31/21 Right 06/05/21 Right 06/06/21 Right 06/11/21 Right 06/14/21 Right 06/19/21 Right 06/26/21 Right 06/28/21 Right  07/02/21  Hip flexion             Hip extension             Hip abduction             Hip adduction             Hip internal rotation             Hip external rotation             Knee flexion Supine P: 64* Seated  A: 64* Supine AROM heel slide:61 PROM overpressure in heel slide : 66   Supine AA: 73* Seated  P: 75*  Seated P: 79* Seated P: 75 Seated P: 70 with muscle guarding limiting Seated P:78 after manual therapy Seated P: 75*  A: 64* after manual therapy  Knee extension Supine P: -10* Seated  A: -31* Seated AROM LAQ: -21 Seated LAQ A: -14* Supine P: -9* Supine A: -8 Supine P: -7* Supine A: -8 P:-6 Supine A:-8 P:-5  Seated P: -7* after manual  therapy  Ankle dorsiflexion             Ankle plantarflexion             Ankle inversion             Ankle eversion              (Blank rows = not tested)   LE MMT:   MMT Right 05/28/2021 Left 05/28/2021  Hip flexion      Hip extension      Hip abduction      Hip adduction      Hip internal rotation      Hip external rotation      Knee flexion 2/5 3   Knee extension 2/5  3  Ankle dorsiflexion      Ankle plantarflexion      Ankle inversion      Ankle eversion       (Blank rows = not tested)   GAIT: 05/28/2021 Distance walked: 50' Assistive device utilized:  4 footed walker level of assistance: SBA  verbal cues needed Comments: antalgic, right knee flexed in stance, limited to no increase flexion for swing, RLE abducted,    Functional Activities: Pt using LLE to move RLE in & out of bed.  PT min guard RLE and she was able to move RLE without assisting with LLE.    TODAY'S TREATMENT: 07/02/2021 Therapeutic Exercise:  Aerobic:Nu step seat 6 with BLEs & BUEs L5 X 8 min  Machines for strength:  Supine:  Prone:  Seated: LAQ and active knee flexion with LLE+ opposing motion 3 minutes with 3# on Rt.  Seated hip int rot & ext rot  Seated hamstring stretch 30 sec X 3  Standing:    Standing with RLE on 1 step & 2 steps up on stairs knee flexion stretch 10 sec X 10   Gastroc stretch heel depression on step 30 sec 2 reps & incline board 30 sec 3 reps with PT using mob belt for ext.    Therapeutic Activities:  sitting with knee flexed with cues to not sit with RLE extended  Stairs: descend step-to with RLE eccentric with right rail. Progressively became more fluent.   Ascend alternating pattern with swing knee flexion & stance ext 11 steps with left rail.   PT instructed to not sit with RLE kick out with knee in comfortable slight knee  flexion.  She needs to sit with knee flexed to max tolerable with feet on floor or sit at edge with knee ext to max able. Pt verbalized understanding.    Pt amb with RLE stiff with knee flexed in stance & minimal to no increase in flexion for swing.  PT demo & verbal cues on terminal stance to swing knee flexion. She needs to flex knee in gait for all mobility. Pt verbalized understanding.   Manual Therapy: Rt knee PROM for flexion and extension to tolerance and contract relax  Modalities: vaso rt knee high compression 34* 10 min (knee extension stretch first 5 min)  6/8/232023 Therapeutic Exercise:  Aerobic:Nu step L5 X 6 min  Machines for strength:  Supine:  Prone:  Seated: LAQ and active knee flexion with LLE+ opposing motion 3 minutes with 3# on Rt.  Seated AAROM knee flexion stretch 10 sec X 10 Seated hamstring stretch 30 sec X 3  Standing:    Squat holding sink 5 sec hold 15 reps.    Knee flexion lunge stretch with Rt foot on treadmill 10 sec X10    Manual Therapy: Rt knee PROM for flexion and extension to tolerance and contract relax  Modalities: vaso rt knee high compression 34* 10 min (knee extension stretch first 5 min)  6/6/232023 Therapeutic Exercise:  Aerobic: Scit fit bike rocking for knee ROM X 3 min then stopped due to pain and moved to Nu step L6 X 7 min  Machines for strength: Leg Press with seat lock removed RLE only 37# 20 reps Supine:  Prone:  Seated: LAQ and active knee flexion with LLE+ opposing motion 2.5 minutes with 3# on Rt.  Seated AAROM knee flexion stretch 10 sec X 10  Standing: gastroc stretch on incline board 30 sec hold 3 reps.   Squat holding sink 5 sec hold 15 reps.    Knee flexion lunge stretch with Rt foot on treadmill 10 sec X10   Step ups 6 inch step X 10 with Rt LE with one UE support Manual Therapy: Rt knee PROM for flexion and extension to tolerance and contract relax  Modalities: vaso rt knee high compression 34* 10 min (knee extension stretch last 5 min)    HOME EXERCISE PROGRAM: Access Code: [email or web address] URL: [email or web address] Date: 06/19/2021 Prepared by: Elsie Ra  Exercises - Quad Setting and Stretching  - 2-4 x daily - 7 x weekly - 5-10 sets - 10 reps - prop 5-10 minutes & quad set5 seconds hold - Supine Heel Slide with Strap  - 2-3 x daily - 7 x weekly - 2-3 sets - 10 reps - 5 seconds hold - Seated Knee Flexion Stretch  - 2 x daily - 6 x weekly - 1-2 sets - 10 reps - 5 sec hold - Seated straight leg lifts  - 2-3 x daily - 7 x weekly - 2-3 sets - 10 reps - 5 seconds hold - Seated Hamstring Stretch with Strap  - 2-4 x daily - 7 x weekly - 1 sets - 3 reps - 20-30 seconds hold - Sit to Stand with Counter Support  - 2 x daily - 6 x weekly - 1-2 sets - 10 reps   ASSESSMENT:   CLINICAL IMPRESSION: Patient seems to be protecting knee to extent that she limits range between PT sessions.  She appears to understand PT recommendations for gait, sitting position & stairs.  Her range continues to be slow to progress and may need a manipulation. She  is very anxious when PT discussed possible need.     OBJECTIVE IMPAIRMENTS Abnormal gait, decreased activity tolerance, decreased balance, decreased knowledge of condition, decreased knowledge of use of DME, decreased mobility, difficulty walking, decreased ROM, decreased strength, increased edema, increased muscle spasms, impaired flexibility, postural dysfunction, and pain.    ACTIVITY LIMITATIONS community activity, driving, and personal recreation like Yoga & walking dogs .    PERSONAL FACTORS 1-2 comorbidities: see PMH  are also affecting patient's functional outcome.    REHAB POTENTIAL: Good   CLINICAL DECISION MAKING: Stable/uncomplicated   EVALUATION COMPLEXITY: Low     GOALS: Goals reviewed with patient? Yes   SHORT TERM GOALS: Target date: 06/22/2021   Patient independent and verbalizes compliance with initial HEP Baseline: SEE OBJECTIVE DATA Goal status: on going 05/29/2021   2.  Patient reports 50% improvement in right knee pain. Baseline: SEE OBJECTIVE DATA Goal status: on going  05/29/2021   3.  PROM right knee extension -3* to flexion 90* Baseline: SEE OBJECTIVE DATA Goal status: on going 05/29/2021   LONG TERM GOALS: Target date: 07/20/2021   Patient will improve FOTO score to 67% Baseline: SEE OBJECTIVE DATA Goal status: INITIAL   2.  Patient reports right knee pain </= 2/10 with standing & gait activities.  Baseline: SEE OBJECTIVE DATA Goal status: INITIAL   3.  Right Knee PROM 0* extension to 110* flexion Baseline: SEE OBJECTIVE DATA Goal status: INITIAL   4.  Right Knee AROM seated -3* extension to 100* flexion Baseline: SEE OBJECTIVE DATA Goal status: INITIAL   5.  Patient ambulates >500' community distances including negotiating ramps, curbs & stairs without device independently. Baseline: SEE OBJECTIVE DATA Goal status: INITIAL   6.  Patient reports ability to perform Yoga & walk her dogs. Baseline: SEE OBJECTIVE DATA Goal status: INITIAL   PLAN: PT FREQUENCY: 3x/week for 4 weeks then 2x/wk for 4 weeks.   PT DURATION: 8 weeks   PLANNED INTERVENTIONS: Therapeutic exercises, Therapeutic activity, Neuromuscular re-education, Balance training, Gait training, Patient/Family education, Joint mobilization, Stair training, Vestibular training, DME instructions, Electrical stimulation, Cryotherapy, Moist heat, scar mobilization, Taping, Vasopneumatic device, and Manual therapy   PLAN FOR NEXT SESSION:   check on appt with PA, check if working on ROM at home, continue manual therapy, ROM emphasis. vaso to end  Jamey Reas, PT, DPT 07/02/21 11:32 AM

## 2021-07-04 ENCOUNTER — Ambulatory Visit (INDEPENDENT_AMBULATORY_CARE_PROVIDER_SITE_OTHER): Payer: No Typology Code available for payment source | Admitting: Physical Therapy

## 2021-07-04 ENCOUNTER — Ambulatory Visit (INDEPENDENT_AMBULATORY_CARE_PROVIDER_SITE_OTHER): Payer: No Typology Code available for payment source | Admitting: Surgical

## 2021-07-04 ENCOUNTER — Encounter: Payer: Self-pay | Admitting: Surgical

## 2021-07-04 ENCOUNTER — Encounter: Payer: Self-pay | Admitting: Physical Therapy

## 2021-07-04 DIAGNOSIS — R2681 Unsteadiness on feet: Secondary | ICD-10-CM

## 2021-07-04 DIAGNOSIS — M25661 Stiffness of right knee, not elsewhere classified: Secondary | ICD-10-CM

## 2021-07-04 DIAGNOSIS — M24661 Ankylosis, right knee: Secondary | ICD-10-CM

## 2021-07-04 DIAGNOSIS — M6281 Muscle weakness (generalized): Secondary | ICD-10-CM | POA: Diagnosis not present

## 2021-07-04 DIAGNOSIS — R600 Localized edema: Secondary | ICD-10-CM

## 2021-07-04 DIAGNOSIS — M25561 Pain in right knee: Secondary | ICD-10-CM

## 2021-07-04 DIAGNOSIS — Z96659 Presence of unspecified artificial knee joint: Secondary | ICD-10-CM

## 2021-07-04 DIAGNOSIS — R2689 Other abnormalities of gait and mobility: Secondary | ICD-10-CM

## 2021-07-04 MED ORDER — METHOCARBAMOL 500 MG PO TABS: 500.0000 mg | ORAL_TABLET | Freq: Three times a day (TID) | ORAL | 0 refills | Status: DC | PRN

## 2021-07-04 NOTE — Progress Notes (Signed)
Post-Op Visit Note   Patient: Cheryl Tate           Date of Birth: 1971-01-20           MRN: [id number] Visit Date: 07/04/2021 PCP: Eunice Blase, MD   Assessment & Plan:  Chief Complaint:  Chief Complaint  Patient presents with   Right Knee - Routine Post Op      05/22/21 right TKA     Visit Diagnoses:  1. Arthrofibrosis of knee joint, right   2. History of total knee arthroplasty, unspecified laterality     Plan: Patient is a 51 year old female who presents s/p right knee total knee arthroplasty on 05/22/2021.  She reports that she is doing well overall with no difficulties aside from stiffness of her knee.  She has been able to stop taking her pain medication as of last week.  She does complain of a lot of muscle spasms at night.  Taking Tylenol twice daily as well as gabapentin and muscle relaxer.  Refilled muscle relaxer today.  She has not resumed her hormone therapy yet.  She is still in physical therapy going 2 times per week and has begun to start driving again.  On exam, patient has about 5 degrees extension and 60 degrees of knee flexion.  She has some generalized swelling without effusion.  Incision is well-healed without evidence of infection or dehiscence.  No sinus tract noted.  No calf tenderness.  Negative Homans' sign.  Patient is able to perform multiple straight leg raises with 5 out of/5 quad strength.  She has decreased side-to-side mobility of her patella compared with contralateral knee.  She has a very hard endpoint in regards to her flexion.  Impression is right knee arthrofibrosis following right knee replacement in early May.  Discussed the options available to patient but think that she would most benefit from manipulation under anesthesia at this point in time.  Discussed the risks and benefits of the procedure as well as the recovery timeframe and the expected pain after surgery.  After long discussion, patient would like to proceed.  She will be  posted for manipulation under anesthesia next Tuesday.  She will be set up for CPM machine to use for about 6 hours/day following the procedure.  Plan to resume her hormone therapy about 2 weeks after her manipulation to give her a little bit more time to get back to being more mobile following the manipulation.  Also with her von Willebrand's disease, she would benefit from some TXA at the time of procedure and potentially some intra-articular injection of topical TXA.  Follow-up after procedure.  Follow-Up Instructions: No follow-ups on file.   Orders:  No orders of the defined types were placed in this encounter.  No orders of the defined types were placed in this encounter.   Imaging: No results found.  PMFS History: Patient Active Problem List   Diagnosis Date Noted   Arthritis of right knee    S/P knee replacement 05/22/2021   BMI 33.0-33.9,adult 06/16/2015   Von Willebrand disease ([place]) 06/13/2015   Depression 12/29/2014   Rhinitis, allergic 12/28/2014   Past Medical History:  Diagnosis Date   Anemia    Depression    Menorrhagia    Mood disorder ([place])    Von Willebrand's disease ([place])    followed by hemotologist Dr. Marin Olp    Family History  Problem Relation Age of Onset   Mental retardation Mother    Mental retardation Brother  Mental retardation Maternal Grandmother     Past Surgical History:  Procedure Laterality Date   APPENDECTOMY  01/22/2004   DILATION AND CURETTAGE OF UTERUS  2002   x 2 in 2002   DILITATION & CURRETTAGE/HYSTROSCOPY WITH HYDROTHERMAL ABLATION N/A 11/22/2014   Procedure: HYSTEROSCOPY WITH HYDROTHERMAL ABLATION;  Surgeon: Dian Queen, MD;  Location: [place];  Service: Gynecology;  Laterality: N/A;   HYSTEROSCOPY WITH D & C  08-08-2014  in [place]   MULTIPLE TOOTH EXTRACTIONS     TOTAL KNEE ARTHROPLASTY Right 05/22/2021   Procedure: RIGHT TOTAL KNEE ARTHROPLASTY;  Surgeon: Meredith Pel, MD;  Location: [place];   Service: Orthopedics;  Laterality: Right;   Social History   Occupational History   Occupation: Biomedical scientist, Tree surgeon  Tobacco Use   Smoking status: Every Day    Packs/day: 0.20    Years: 25.00    Total pack years: 5.00    Types: Cigarettes   Smokeless tobacco: Never   Tobacco comments:    2 cig daily  Vaping Use   Vaping Use: Never used  Substance and Sexual Activity   Alcohol use: Yes    Alcohol/week: 7.0 standard drinks of alcohol    Types: 7 Glasses of wine per week    Comment: one wine daily   Drug use: Yes    Types: Marijuana    Comment: helps with sleep nightly   Sexual activity: Yes

## 2021-07-04 NOTE — Therapy (Signed)
OUTPATIENT PHYSICAL THERAPY TREATMENT NOTE & Recertification    Patient Name: Cheryl Tate MRN: [id number] DOB:1970/11/09, 51 y.o., female Today's Date: 07/04/2021   END OF SESSION:   PT End of Session - 07/04/21 1145     Visit Number 16    Number of Visits 36    Date for PT Re-Evaluation 08/31/21    Authorization Type U[employer]    Authorization Time Period [employer] CHOICE  NO VISIT LIMIT, PATIENT PAYS 100% UNTIL DED IS MET, DED $7500 [id number] MET, TERM DATE 09/19/2021    Progress Note Due on Visit 26    PT Start Time 1145    PT Stop Time 1244    PT Time Calculation (min) 59 min    Activity Tolerance Patient limited by pain    Behavior During Therapy [facility] for tasks assessed/performed                     Past Medical History:  Diagnosis Date   Anemia    Depression    Menorrhagia    Mood disorder ([place])    Von Willebrand's disease ([place])    followed by hemotologist Dr. Marin Olp   Past Surgical History:  Procedure Laterality Date   APPENDECTOMY  01/22/2004   DILATION AND CURETTAGE OF UTERUS  2002   x 2 in 2002   DILITATION & CURRETTAGE/HYSTROSCOPY WITH HYDROTHERMAL ABLATION N/A 11/22/2014   Procedure: HYSTEROSCOPY WITH HYDROTHERMAL ABLATION;  Surgeon: Dian Queen, MD;  Location: [place];  Service: Gynecology;  Laterality: N/A;   HYSTEROSCOPY WITH D & C  08-08-2014  in [place]   MULTIPLE TOOTH EXTRACTIONS     TOTAL KNEE ARTHROPLASTY Right 05/22/2021   Procedure: RIGHT TOTAL KNEE ARTHROPLASTY;  Surgeon: Meredith Pel, MD;  Location: [place];  Service: Orthopedics;  Laterality: Right;   Patient Active Problem List   Diagnosis Date Noted   Arthritis of right knee    S/P knee replacement 05/22/2021   BMI 33.0-33.9,adult 06/16/2015   Von Willebrand disease ([place]) 06/13/2015   Depression 12/29/2014   Rhinitis, allergic 12/28/2014    PCP: Eunice Blase, MD   REFERRING PROVIDER: Donella Stade, PA-C   REFERRING DIAG: [phone] (ICD-10-CM) -  History of total knee arthroplasty, unspecified laterality  ONSET DATE: 05/22/2021 Right TKA   THERAPY DIAG:  Stiffness of right knee, not elsewhere classified  Acute pain of right knee  Localized edema  Muscle weakness (generalized)  Other abnormalities of gait and mobility  Unsteadiness on feet  PERTINENT HISTORY: Von Willebrand disease, depression, Menorrhgia  PRECAUTIONS: None  SUBJECTIVE:  Patient is has a manipulation next Tuesday.    PAIN:  NPRS scale:  2-3/10 ranging 0/10 - 4-5/10 Pain location: right knee all over.  Pain description: sharp, sore, cramps Aggravating factors: fall Relieving factors: ice, meds   OBJECTIVE:    PATIENT SURVEYS:  05/28/2021 FOTO 29% functional & target 67%   COGNITION: 05/28/2021 Overall cognitive status: Within functional limits for tasks assessed                EDEMA: 05/28/2021 RLE above knee 51.8cm around knee 48cm below knee  41.2cm LLE above knee 45.8cm around knee 40cm below knee 34.1cm   PALPATION: 05/28/2021 Bruising along medial knee & lower leg, tenderness over joint line, quads, hamstrings   LE ROM:   ROM P: Passive  A: Active Right 05/28/21 Right 05/31/21 Right 06/05/21 Right 06/06/21 Right 06/11/21 Right 06/14/21 Right 06/19/21 Right 06/26/21 Right 06/28/21 Right 07/02/21  Hip flexion             Hip extension             Hip abduction             Hip adduction             Hip internal rotation             Hip external rotation             Knee flexion Supine P: 64* Seated  A: 64* Supine AROM heel slide:61 PROM overpressure in heel slide : 66   Supine AA: 73* Seated  P: 75*  Seated P: 79* Seated P: 75 Seated P: 70 with muscle guarding limiting Seated P:78 after manual therapy Seated P: 75*  A: 64* after manual therapy  Knee extension Supine P: -10* Seated  A: -31* Seated AROM LAQ: -21 Seated LAQ A: -14* Supine P: -9* Supine A: -8 Supine P: -7* Supine A: -8 P:-6 Supine A:-8 P:-5  Seated P: -7*  after manual therapy  Ankle dorsiflexion             Ankle plantarflexion             Ankle inversion             Ankle eversion              (Blank rows = not tested)   LE MMT:   MMT Right 05/28/2021 Left 05/28/2021  Hip flexion      Hip extension      Hip abduction      Hip adduction      Hip internal rotation      Hip external rotation      Knee flexion 2/5   Knee extension 2/5   Ankle dorsiflexion      Ankle plantarflexion      Ankle inversion      Ankle eversion       (Blank rows = not tested)   GAIT: 05/28/2021 Distance walked: 30' Assistive device utilized:  4 footed walker level of assistance: SBA  verbal cues needed Comments: antalgic, right knee flexed in stance, limited to no increase flexion for swing, RLE abducted,    Functional Activities: Pt using LLE to move RLE in & out of bed.  PT min guard RLE and she was able to move RLE without assisting with LLE.    TODAY'S TREATMENT: 07/04/2021 Therapeutic Exercise:  Aerobic:Nu step seat 6 with BLEs only L7 X 8 min - cues to increase speed with ext to create force & 5 sec hold for max flexion  Machines for strength: knee ext 5# BLEs concentric focus on engaging RLE & eccentric RLE only 15 reps 2 sets. 60 sec rest bw sets with low grade stretch.  Knee flex BLEs 20# 15 reps focus on engaging RLE concentric & eccentric;  10# 15 reps RLE only using UE assist to create increase in end range  Supine:  Prone:  Seated: LAQ and active knee flexion with LLE+ opposing motion 3 minutes with 0# on Rt.   Hamstring stretch RLE long sit strap DF & manual knee ext 30 sec 3 reps.  Standing:     Self-Care:  PT verbal cues on ice massage. Pt verbalized understanding.   Therapeutic Activities:    Manual Therapy: Rt knee PROM for flexion and extension to tolerance and contract relax  Modalities: vaso rt knee high compression 34* 10 min (  knee extension stretch first 5 min)  07/02/2021 Therapeutic Exercise:  Aerobic:Nu step seat 6 with  BLEs & BUEs L5 X 8 min  Machines for strength:  Supine:  Prone:  Seated: LAQ and active knee flexion with LLE+ opposing motion 3 minutes with 3# on Rt.  Seated hip int rot & ext rot  Seated hamstring stretch 30 sec X 3  Standing:    Standing with RLE on 1 step & 2 steps up on stairs knee flexion stretch 10 sec X 10   Gastroc stretch heel depression on step 30 sec 2 reps & incline board 30 sec 3 reps with PT using mob belt for ext.    Therapeutic Activities:  sitting with knee flexed with cues to not sit with RLE extended  Stairs: descend step-to with RLE eccentric with right rail. Progressively became more fluent.   Ascend alternating pattern with swing knee flexion & stance ext 11 steps with left rail.   PT instructed to not sit with RLE kick out with knee in comfortable slight knee flexion.  She needs to sit with knee flexed to max tolerable with feet on floor or sit at edge with knee ext to max able. Pt verbalized understanding.   Pt amb with RLE stiff with knee flexed in stance & minimal to no increase in flexion for swing.  PT demo & verbal cues on terminal stance to swing knee flexion. She needs to flex knee in gait for all mobility. Pt verbalized understanding.   Manual Therapy: Rt knee PROM for flexion and extension to tolerance and contract relax  Modalities: vaso rt knee high compression 34* 10 min (knee extension stretch first 5 min)  6/8/232023 Therapeutic Exercise:  Aerobic:Nu step L5 X 6 min  Machines for strength:  Supine:  Prone:  Seated: LAQ and active knee flexion with LLE+ opposing motion 3 minutes with 3# on Rt.  Seated AAROM knee flexion stretch 10 sec X 10 Seated hamstring stretch 30 sec X 3  Standing:    Squat holding sink 5 sec hold 15 reps.    Knee flexion lunge stretch with Rt foot on treadmill 10 sec X10    Manual Therapy: Rt knee PROM for flexion and extension to tolerance and contract relax  Modalities: vaso rt knee high compression 34* 10 min (knee  extension stretch first 5 min)     HOME EXERCISE PROGRAM: Access Code: [email or web address] URL: [email or web address] Date: 06/19/2021 Prepared by: Elsie Ra  Exercises - Quad Setting and Stretching  - 2-4 x daily - 7 x weekly - 5-10 sets - 10 reps - prop 5-10 minutes & quad set5 seconds hold - Supine Heel Slide with Strap  - 2-3 x daily - 7 x weekly - 2-3 sets - 10 reps - 5 seconds hold - Seated Knee Flexion Stretch  - 2 x daily - 6 x weekly - 1-2 sets - 10 reps - 5 sec hold - Seated straight leg lifts  - 2-3 x daily - 7 x weekly - 2-3 sets - 10 reps - 5 seconds hold - Seated Hamstring Stretch with Strap  - 2-4 x daily - 7 x weekly - 1 sets - 3 reps - 20-30 seconds hold - Sit to Stand with Counter Support  - 2 x daily - 6 x weekly - 1-2 sets - 10 reps   ASSESSMENT:   CLINICAL IMPRESSION: Patient is scheduled for manipulation next Tuesday. She needs PT at higher frequency for first 2 weeks to maximize benefits  of manipulation. Today's session focused on strengthening. Pt continues to need skilled PT for recovery of TKR.    OBJECTIVE IMPAIRMENTS Abnormal gait, decreased activity tolerance, decreased balance, decreased knowledge of condition, decreased knowledge of use of DME, decreased mobility, difficulty walking, decreased ROM, decreased strength, increased edema, increased muscle spasms, impaired flexibility, postural dysfunction, and pain.    ACTIVITY LIMITATIONS community activity, driving, and personal recreation like Yoga & walking dogs .    PERSONAL FACTORS 1-2 comorbidities: see PMH  are also affecting patient's functional outcome.    REHAB POTENTIAL: Good   CLINICAL DECISION MAKING: Stable/uncomplicated   EVALUATION COMPLEXITY: Low     GOALS: Goals reviewed with patient? Yes   SHORT TERM GOALS: Target date: 06/22/2021   Patient independent and verbalizes compliance with initial HEP Baseline: SEE OBJECTIVE DATA Goal status: on going 05/29/2021   2.  Patient  reports 50% improvement in right knee pain. Baseline: SEE OBJECTIVE DATA Goal status: on going 05/29/2021   3.  PROM right knee extension -3* to flexion 90* Baseline: SEE OBJECTIVE DATA Goal status: on going 05/29/2021   LONG TERM GOALS: Target date: 08/31/2021   Patient will improve FOTO score to 67% Baseline: SEE OBJECTIVE DATA Goal status: ongoing 07/04/2021   2.  Patient reports right knee pain </= 2/10 with standing & gait activities.  Baseline: SEE OBJECTIVE DATA Goal status: ongoing 07/04/2021   3.  Right Knee PROM 0* extension to 110* flexion Baseline: SEE OBJECTIVE DATA Goal status: ongoing 07/04/2021   4.  Right Knee AROM seated -3* extension to 100* flexion Baseline: SEE OBJECTIVE DATA Goal status: ongoing 07/04/2021   5.  Patient ambulates >500' community distances including negotiating ramps, curbs & stairs without device independently. Baseline: SEE OBJECTIVE DATA Goal status: ongoing 07/04/2021   6.  Patient reports ability to perform Yoga & walk her dogs. Baseline: SEE OBJECTIVE DATA Goal status: ongoing 07/04/2021   PLAN: PT FREQUENCY: 3x/week of manipulation, 4x/wk following week, 2-3xwk for 6 weeks   PT DURATION: 8 additional weeks   PLANNED INTERVENTIONS: Therapeutic exercises, Therapeutic activity, Neuromuscular re-education, Balance training, Gait training, Patient/Family education, Joint mobilization, Stair training, Vestibular training, DME instructions, Electrical stimulation, Cryotherapy, Moist heat, scar mobilization, Taping, Vasopneumatic device, physical performance testing and Manual therapy   PLAN FOR NEXT SESSION:   emphasize range including HEP following manipulation, continue manual therapy, ROM emphasis. vaso to end  Jamey Reas, PT, DPT 07/04/21 12:48 PM

## 2021-07-06 ENCOUNTER — Other Ambulatory Visit: Payer: Self-pay | Admitting: Surgical

## 2021-07-06 MED ORDER — METHOCARBAMOL 500 MG PO TABS: 500.0000 mg | ORAL_TABLET | Freq: Three times a day (TID) | ORAL | 0 refills | Status: AC | PRN

## 2021-07-10 ENCOUNTER — Encounter: Payer: No Typology Code available for payment source | Admitting: Physical Therapy

## 2021-07-11 ENCOUNTER — Encounter: Payer: Self-pay | Admitting: Physical Therapy

## 2021-07-11 ENCOUNTER — Ambulatory Visit (INDEPENDENT_AMBULATORY_CARE_PROVIDER_SITE_OTHER): Payer: No Typology Code available for payment source | Admitting: Physical Therapy

## 2021-07-11 ENCOUNTER — Encounter (HOSPITAL_COMMUNITY): Payer: Self-pay | Admitting: Orthopedic Surgery

## 2021-07-11 ENCOUNTER — Ambulatory Visit: Payer: No Typology Code available for payment source | Admitting: Orthopedic Surgery

## 2021-07-11 ENCOUNTER — Other Ambulatory Visit: Payer: Self-pay

## 2021-07-11 DIAGNOSIS — R600 Localized edema: Secondary | ICD-10-CM

## 2021-07-11 DIAGNOSIS — M6281 Muscle weakness (generalized): Secondary | ICD-10-CM | POA: Diagnosis not present

## 2021-07-11 DIAGNOSIS — R2681 Unsteadiness on feet: Secondary | ICD-10-CM

## 2021-07-11 DIAGNOSIS — M25561 Pain in right knee: Secondary | ICD-10-CM

## 2021-07-11 DIAGNOSIS — M25661 Stiffness of right knee, not elsewhere classified: Secondary | ICD-10-CM | POA: Diagnosis not present

## 2021-07-11 DIAGNOSIS — R2689 Other abnormalities of gait and mobility: Secondary | ICD-10-CM

## 2021-07-11 MED ORDER — TRANEXAMIC ACID 1000 MG/10ML IV SOLN
2000.0000 mg | INTRAVENOUS | Status: DC
  Filled 2021-07-11 (×2): qty 20

## 2021-07-11 NOTE — Therapy (Signed)
OUTPATIENT PHYSICAL THERAPY TREATMENT NOTE   Patient Name: Cheryl Tate MRN: [id number] DOB:1970/03/08, 51 y.o., female Today's Date: 07/11/2021   END OF SESSION:   PT End of Session - 07/11/21 0841     Visit Number 17    Number of Visits 36    Date for PT Re-Evaluation 08/31/21    Authorization Type U[employer]    Authorization Time Period [facility] CHOICE  NO VISIT LIMIT, PATIENT PAYS 100% UNTIL DED IS MET, DED $7500 [id number] MET, TERM DATE 09/19/2021    Progress Note Due on Visit 26    PT Start Time 0841    PT Stop Time 0929    PT Time Calculation (min) 48 min    Activity Tolerance Patient limited by pain    Behavior During Therapy [facility] for tasks assessed/performed                      Past Medical History:  Diagnosis Date   Anemia    Depression    Menorrhagia    Mood disorder ([place])    Von Willebrand's disease ([place])    followed by hemotologist Dr. Marin Olp   Past Surgical History:  Procedure Laterality Date   APPENDECTOMY  01/22/2004   DILATION AND CURETTAGE OF UTERUS  2002   x 2 in 2002   DILITATION & CURRETTAGE/HYSTROSCOPY WITH HYDROTHERMAL ABLATION N/A 11/22/2014   Procedure: HYSTEROSCOPY WITH HYDROTHERMAL ABLATION;  Surgeon: Dian Queen, MD;  Location: [place];  Service: Gynecology;  Laterality: N/A;   HYSTEROSCOPY WITH D & C  08-08-2014  in [place]   MULTIPLE TOOTH EXTRACTIONS     TOTAL KNEE ARTHROPLASTY Right 05/22/2021   Procedure: RIGHT TOTAL KNEE ARTHROPLASTY;  Surgeon: Meredith Pel, MD;  Location: [place];  Service: Orthopedics;  Laterality: Right;   Patient Active Problem List   Diagnosis Date Noted   Arthritis of right knee    S/P knee replacement 05/22/2021   BMI 33.0-33.9,adult 06/16/2015   Von Willebrand disease ([place]) 06/13/2015   Depression 12/29/2014   Rhinitis, allergic 12/28/2014    PCP: Eunice Blase, MD   REFERRING PROVIDER: Donella Stade, PA-C   REFERRING DIAG: [id number] (ICD-10-CM) - History of total knee  arthroplasty, unspecified laterality  ONSET DATE: 05/22/2021 Right TKA   THERAPY DIAG:  Stiffness of right knee, not elsewhere classified  Acute pain of right knee  Localized edema  Muscle weakness (generalized)  Other abnormalities of gait and mobility  Unsteadiness on feet  PERTINENT HISTORY: Von Willebrand disease, depression, Menorrhgia  PRECAUTIONS: None  SUBJECTIVE:  She is having manipulation tomorrow at 4:00pm.  She is doing her exercises but limited due to father hospitalized.  She has not heard from surgery center. She has not gotten CPM back yet. She likes the ice massage with plans to use it after manipulation.   PAIN:  NPRS scale:  0/10 ranging 0/10 - 1/0 (days)  4/10 (Night) Pain location: right knee all over.  Pain description: sharp, sore, cramps Aggravating factors: fall Relieving factors: ice, meds   OBJECTIVE:    PATIENT SURVEYS:  05/28/2021 FOTO 29% functional & target 67%   COGNITION: 05/28/2021 Overall cognitive status: Within functional limits for tasks assessed                EDEMA: 05/28/2021 RLE above knee 51.8cm around knee 48cm below knee  41.2cm LLE above knee 45.8cm around knee 40cm below knee 34.1cm   PALPATION: 05/28/2021 Bruising along medial knee & lower  leg, tenderness over joint line, quads, hamstrings   LE ROM:   ROM P: Passive  A: Active Right 05/28/21 Right 05/31/21 Right 06/05/21 Right 06/06/21 Right 06/11/21 Right 06/14/21 Right 06/19/21 Right 06/26/21 Right 06/28/21 Right 07/02/21  Hip flexion             Hip extension             Hip abduction             Hip adduction             Hip internal rotation             Hip external rotation             Knee flexion Supine P: 64* Seated  A: 64* Supine AROM heel slide:61 PROM overpressure in heel slide : 66   Supine AA: 73* Seated  P: 75*  Seated P: 79* Seated P: 75 Seated P: 70 with muscle guarding limiting Seated P:78 after manual therapy Seated P: 75*  A: 64* after  manual therapy  Knee extension Supine P: -10* Seated  A: -31* Seated AROM LAQ: -21 Seated LAQ A: -14* Supine P: -9* Supine A: -8 Supine P: -7* Supine A: -8 P:-6 Supine A:-8 P:-5  Seated P: -7* after manual therapy  Ankle dorsiflexion             Ankle plantarflexion             Ankle inversion             Ankle eversion              (Blank rows = not tested)   LE MMT:   MMT Right 05/28/2021 Left 05/28/2021  Hip flexion      Hip extension      Hip abduction      Hip adduction      Hip internal rotation      Hip external rotation      Knee flexion 2/5   Knee extension 2/5   Ankle dorsiflexion      Ankle plantarflexion      Ankle inversion      Ankle eversion       (Blank rows = not tested)   GAIT: 05/28/2021 Distance walked: 40' Assistive device utilized:  4 footed walker level of assistance: SBA  verbal cues needed Comments: antalgic, right knee flexed in stance, limited to no increase flexion for swing, RLE abducted,    Functional Activities: Pt using LLE to move RLE in & out of bed.  PT min guard RLE and she was able to move RLE without assisting with LLE.    TODAY'S TREATMENT: 07/11/2021 Therapeutic Exercise:  Aerobic: recumbent bike ([employer]) seat 7 with BLEs & BUEs partial revolutions for 30 sec then level 1 full revolutions for 8 min  Machines for strength:   Leg Press: BLEs 125# 20 reps with 5 sec ext & flex hold;  RLE only 68# 10 reps knee ext 5# BLEs concentric focus on engaging RLE & eccentric RLE only 15 reps 2 sets. 60 sec rest bw sets with low grade stretch.  Knee flex BLEs 20# 15 reps focus on engaging RLE concentric & eccentric;  10# 15 reps RLE only using UE assist to create increase in end range  Supine: quad stretch supine RLE over edge 30 sec hold 2 reps LLE 1st rep hooklying 2nd rep knee to chest Prone:  Seated: LAQ & active knee flexion  with contralateral LE opposing motion 20 reps  Hamstring stretch RLE long sit strap DF & manual knee ext 30  sec 2 reps.  Standing:     Self-Care:    Therapeutic Activities:  PT cued for knee flexion in gait terminal stance & swing  Manual Therapy: Rt knee PROM for flexion and extension to tolerance and contract relax  Modalities: vaso rt knee high compression 34* 10 min (knee extension stretch first 5 min)  07/04/2021 Therapeutic Exercise:  Aerobic:Nu step seat 6 with BLEs only L7 X 8 min - cues to increase speed with ext to create force & 5 sec hold for max flexion  Machines for strength: knee ext 5# BLEs concentric focus on engaging RLE & eccentric RLE only 15 reps 2 sets. 60 sec rest bw sets with low grade stretch.  Knee flex BLEs 20# 15 reps focus on engaging RLE concentric & eccentric;  10# 15 reps RLE only using UE assist to create increase in end range  Supine:  Prone:  Seated: LAQ and active knee flexion with LLE+ opposing motion 3 minutes with 0# on Rt.   Hamstring stretch RLE long sit strap DF & manual knee ext 30 sec 3 reps.  Standing:     Self-Care:  PT verbal cues on ice massage. Pt verbalized understanding.   Therapeutic Activities:    Manual Therapy: Rt knee PROM for flexion and extension to tolerance and contract relax  Modalities: vaso rt knee high compression 34* 10 min (knee extension stretch first 5 min)  07/02/2021 Therapeutic Exercise:  Aerobic:Nu step seat 6 with BLEs & BUEs L5 X 8 min  Machines for strength:  Supine:  Prone:  Seated: LAQ and active knee flexion with LLE+ opposing motion 3 minutes with 3# on Rt.  Seated hip int rot & ext rot  Seated hamstring stretch 30 sec X 3  Standing:    Standing with RLE on 1 step & 2 steps up on stairs knee flexion stretch 10 sec X 10   Gastroc stretch heel depression on step 30 sec 2 reps & incline board 30 sec 3 reps with PT using mob belt for ext.    Therapeutic Activities:  sitting with knee flexed with cues to not sit with RLE extended  Stairs: descend step-to with RLE eccentric with right rail. Progressively  became more fluent.   Ascend alternating pattern with swing knee flexion & stance ext 11 steps with left rail.   PT instructed to not sit with RLE kick out with knee in comfortable slight knee flexion.  She needs to sit with knee flexed to max tolerable with feet on floor or sit at edge with knee ext to max able. Pt verbalized understanding.   Pt amb with RLE stiff with knee flexed in stance & minimal to no increase in flexion for swing.  PT demo & verbal cues on terminal stance to swing knee flexion. She needs to flex knee in gait for all mobility. Pt verbalized understanding.   Manual Therapy: Rt knee PROM for flexion and extension to tolerance and contract relax  Modalities: vaso rt knee high compression 34* 10 min (knee extension stretch first 5 min)      HOME EXERCISE PROGRAM: Access Code: [email or web address] URL: [email or web address] Date: 06/19/2021 Prepared by: Elsie Ra  Exercises - Quad Setting and Stretching  - 2-4 x daily - 7 x weekly - 5-10 sets - 10 reps - prop 5-10 minutes & quad set5 seconds hold - Supine Heel Slide  with Strap  - 2-3 x daily - 7 x weekly - 2-3 sets - 10 reps - 5 seconds hold - Seated Knee Flexion Stretch  - 2 x daily - 6 x weekly - 1-2 sets - 10 reps - 5 sec hold - Seated straight leg lifts  - 2-3 x daily - 7 x weekly - 2-3 sets - 10 reps - 5 seconds hold - Seated Hamstring Stretch with Strap  - 2-4 x daily - 7 x weekly - 1 sets - 3 reps - 20-30 seconds hold - Sit to Stand with Counter Support  - 2 x daily - 6 x weekly - 1-2 sets - 10 reps   ASSESSMENT:   CLINICAL IMPRESSION: She needs PT at higher frequency for first 2 weeks to maximize benefits of manipulation. Today's session focused on strengthening. Pt continues to need skilled PT for recovery of TKR.    OBJECTIVE IMPAIRMENTS Abnormal gait, decreased activity tolerance, decreased balance, decreased knowledge of condition, decreased knowledge of use of DME, decreased mobility, difficulty  walking, decreased ROM, decreased strength, increased edema, increased muscle spasms, impaired flexibility, postural dysfunction, and pain.    ACTIVITY LIMITATIONS community activity, driving, and personal recreation like Yoga & walking dogs .    PERSONAL FACTORS 1-2 comorbidities: see PMH  are also affecting patient's functional outcome.    REHAB POTENTIAL: Good   CLINICAL DECISION MAKING: Stable/uncomplicated   EVALUATION COMPLEXITY: Low     GOALS: Goals reviewed with patient? Yes   SHORT TERM GOALS: Target date: 06/22/2021   Patient independent and verbalizes compliance with initial HEP Baseline: SEE OBJECTIVE DATA Goal status: on going 05/29/2021   2.  Patient reports 50% improvement in right knee pain. Baseline: SEE OBJECTIVE DATA Goal status: on going 05/29/2021   3.  PROM right knee extension -3* to flexion 90* Baseline: SEE OBJECTIVE DATA Goal status: on going 05/29/2021   LONG TERM GOALS: Target date: 08/31/2021   Patient will improve FOTO score to 67% Baseline: SEE OBJECTIVE DATA Goal status: ongoing 07/04/2021   2.  Patient reports right knee pain </= 2/10 with standing & gait activities.  Baseline: SEE OBJECTIVE DATA Goal status: ongoing 07/04/2021   3.  Right Knee PROM 0* extension to 110* flexion Baseline: SEE OBJECTIVE DATA Goal status: ongoing 07/04/2021   4.  Right Knee AROM seated -3* extension to 100* flexion Baseline: SEE OBJECTIVE DATA Goal status: ongoing 07/04/2021   5.  Patient ambulates >500' community distances including negotiating ramps, curbs & stairs without device independently. Baseline: SEE OBJECTIVE DATA Goal status: ongoing 07/04/2021   6.  Patient reports ability to perform Yoga & walk her dogs. Baseline: SEE OBJECTIVE DATA Goal status: ongoing 07/04/2021   PLAN: PT FREQUENCY: 3x/week of manipulation, 4x/wk following week, 2-3xwk for 6 weeks   PT DURATION: 8 additional weeks   PLANNED INTERVENTIONS: Therapeutic exercises, Therapeutic  activity, Neuromuscular re-education, Balance training, Gait training, Patient/Family education, Joint mobilization, Stair training, Vestibular training, DME instructions, Electrical stimulation, Cryotherapy, Moist heat, scar mobilization, Taping, Vasopneumatic device, physical performance testing and Manual therapy   PLAN FOR NEXT SESSION:   check range, emphasize range following manipulation, continue manual therapy. Update HEP. vaso to end  Jamey Reas, PT, DPT 07/11/21 9:30 AM

## 2021-07-11 NOTE — Progress Notes (Signed)
Anesthesia Chart Review: Cheryl Tate  Case: [id number] Date/Time: 07/12/21 1543   Procedure: RIGHT EXAM UNDER ANESTHESIA WITH MANIPULATION OF KNEE (Right: Knee)   Anesthesia type: Choice   Pre-op diagnosis: RIGHT KNEE ARTHROFIBROSIS   Location: [facility] OR ROOM 07 / [place] [facility]   Surgeons: Meredith Pel, MD       DISCUSSION: Patient is a 51 year old female with history of smoking and von Willebrand's disease who is scheduled for the above procedure. She is s/p right TKA 05/22/21. She developed right knee arthrofibrosis and opted for right knee manipulation under anesthesia. Per 07/04/21 note by Gloriann Loan, PA-C, "Also with her von Willebrand's disease, she would benefit from some TXA at the time of procedure and potentially some intra-articular injection of topical TXA."   Prior to recent right TKA, she had a hematology evaluation on 05/14/21 with Lottie Dawson, NP. Labs done included CBC, CMP, von Willebrand panel, coag studies, PT/PTT. CBC, CMP, PT/PTT were overall unremarkable. Her von willebrand factor assessment showed: The VWF:Ag is slightly decreased at 42% (50-200%).  The VWF:RCo is slightly decreased at 34% (50-200%).  The FVIII is normal at 58% (56-140%). Results interpreted as:  "Although the results are abnormal, the panel does not meet  the laboratory criteria for VWD. The 2008 NHLBI VWD  guideline (summary in Am J Hematol. 2009; 84(6):366-370)  suggests that a diagnosis of VWD (excluding type 2N) be  reserved for patients with VWF levels that fall below 30%.  Results in the range of 30 to 50% may be associated with an  increased risk for bleeding, and do not preclude a VWD  diagnosis if supporting clinical history is present, nor  preclude therapy to elevate VWF levels with a clinical  bleeding risk. This evaluation does not distinguish  congenital from acquired von Willebrand syndrome (e.g.  secondary to hypothyroidism, lymphoproliferative disorders,  certain cardiac  conditions associated with increased shear  stress)..."   By notes, she did not receive DDAVP for 2016 hysteroscopy/ablation, but she did receve DDAVP 20 mcg in 0.9% 50 mL in IVPB on call to OR on 05/22/21. She also received TXA 1000 mg IVPB.  Per anesthesiologist assessment on 05/22/21, "Mild Type 1 vWD - no major spontaneous bleeding events, no issues with neuraxial in the past. DDAVP administered preop. Type & screen performed. Will proceed with spinal anesthesia."   Other history includes smoking, von Willebrand's disease, menorrhagia (s/p hydrothermal ablation 11/22/14), anemia, depression.  Case is posted for choice anesthesia. Dicussed with anesthesiologist Oren Bracket, MD given her hematologic history. Labs from 06/23/21 are acceptable for OR, and she had coagulation studies recently in April. Anesthesia team to evaluate on the day of surgery.    VS:  BP Readings from Last 3 Encounters:  06/23/21 (!) 141/93  05/25/21 106/68  05/18/21 (!) 147/89   Pulse Readings from Last 3 Encounters:  06/23/21 92  05/25/21 84  05/18/21 78     PROVIDERS: Eunice Blase, MD is PCP  Burney Gauze, MD is HEM. Last visit was on 05/14/21 with Lottie Dawson, NP.  As needed follow-up recommended.    LABS: Lab results as of 06/22/21 include: Lab Results  Component Value Date   WBC 6.7 06/23/2021   HGB 11.1 (L) 06/23/2021   HCT 34.3 (L) 06/23/2021   PLT 374 06/23/2021   GLUCOSE 104 (H) 06/23/2021   ALT 21 06/23/2021   AST 20 06/23/2021   NA 135 06/23/2021   K 3.9 06/23/2021   CL 103 06/23/2021  CREATININE 0.46 06/23/2021   BUN 10 06/23/2021   CO2 23 06/23/2021   INR 0.9 05/14/2021     IMAGES: CTA Chest 06/01/21: IMPRESSION: - No evidence of pulmonary embolism. - 1.7 cm left adrenal mass, probable benign adenoma. Recommend 1 year follow up adrenal washout CT. If stable for > 1 year, no further f/u imaging. JACR 2017 Aug; 14(8):1038-44, JCAT 2016 Mar-Apr; 40(2):194-200, Urol J 2006  Spring; 3(2):71-4. - No acute intrathoracic abnormalities.  Xray right knee 05/30/21: AP, lateral views of right knee reviewed.  Right knee total knee  prosthesis in good position alignment without any complicating features.    No periprosthetic fracture noted.  No evidence of loosening.   EKG: 05/18/21: NSR   CV: LE Venous US 05/25/21: Summary:  RIGHT:  - There is no evidence of deep vein thrombosis in the lower extremity.  - No cystic structure found in the popliteal fossa.     LEFT:  - There is no evidence of deep vein thrombosis in the lower extremity.  - No cystic structure found in the popliteal fossa.   Past Medical History:  Diagnosis Date   Anemia    Depression    Menorrhagia    Mood disorder ([place])    Von Willebrand's disease [facility])    followed by hemotologist Dr. Marin Olp    Past Surgical History:  Procedure Laterality Date   APPENDECTOMY  01/22/2004   DILATION AND CURETTAGE OF UTERUS  2002   x 2 in 2002   DILITATION & CURRETTAGE/HYSTROSCOPY WITH HYDROTHERMAL ABLATION N/A 11/22/2014   Procedure: HYSTEROSCOPY WITH HYDROTHERMAL ABLATION;  Surgeon: Dian Queen, MD;  Location: [place];  Service: Gynecology;  Laterality: N/A;   HYSTEROSCOPY WITH D & C  08-08-2014  in [place]   MULTIPLE TOOTH EXTRACTIONS     TOTAL KNEE ARTHROPLASTY Right 05/22/2021   Procedure: RIGHT TOTAL KNEE ARTHROPLASTY;  Surgeon: Meredith Pel, MD;  Location: [place];  Service: Orthopedics;  Laterality: Right;    MEDICATIONS: No current facility-administered medications for this encounter.    acetaminophen (TYLENOL) 500 MG tablet   Ascorbic Acid (VITAMIN C) 1000 MG tablet   CALCIUM PO   Cholecalciferol (VITAMIN D3) 125 MCG (5000 UT) CAPS   Cobalamin Combinations (B12 FOLATE) 800-800 MCG CAPS   [employer] TRIPLE MAGNESIUM COMPLEX PO   EPINEPHrine 0.3 mg/0.3 mL IJ SOAJ injection   Ferrous Sulfate (IRON PO)   fluticasone (FLONASE) 50 MCG/ACT nasal spray   gabapentin  (NEURONTIN) 300 MG capsule   Glucosamine-Chondroit-Vit C-Mn (GLUCOSAMINE 1500 COMPLEX PO)   levocetirizine (XYZAL) 5 MG tablet   Naphazoline-Pheniramine (VISINE-A OP)   valproic acid (DEPAKENE) 250 MG capsule   estradiol (ESTRACE) 0.5 MG tablet   methocarbamol (ROBAXIN) 500 MG tablet   progesterone (PROMETRIUM) 200 MG capsule    Myra Gianotti, PA-C Surgical Short Stay/Anesthesiology [facility] Phone [phone] [facility] Phone [phone] 07/11/2021 3:15 PM

## 2021-07-11 NOTE — Progress Notes (Signed)
PCP - Dr. Lemar Livings  Hematologist- Dr. Marin Olp  Cardiologist - Denies  EP- Denies  Endocrine- Denies  Pulm- Denies  Chest x-ray - Denies  EKG - 05/18/21 (E)  Stress Test - Denies  ECHO - Denies  Cardiac Cath - Denies  AICD-na PM-na LOOP-na  Nerve Stimulator- Denies  Dialysis- Denies  Sleep Study - Denies CPAP - Denies  LABS- 06/23/21(E): CBC, CMP  ASA- Denies  ERAS-Yes- clears until 1255  HA1C- Denies  Anesthesia- Yes- hx of Von Willebrand's disease  Pt denies having chest pain, sob, or fever during the pre-op phone. All instructions explained to the pt, with a verbal understanding of the material including: as of today, stop taking all Aspirin (unless instructed by your doctor) and Other Aspirin containing products, Vitamins, Fish oils, and Herbal medications. Also stop all NSAIDS i.e. Advil, Ibuprofen, Motrin, Aleve, Anaprox, Naproxen, BC, Goody Powders, and all Supplements.  Pt also instructed to wear a mask and social distance if she needs to go out. The opportunity to ask questions was provided.

## 2021-07-11 NOTE — Telephone Encounter (Signed)
Hi Lori, can we submit a new CPM machine order for her manipulation as being done tomorrow 07/12/2021?

## 2021-07-11 NOTE — Anesthesia Preprocedure Evaluation (Signed)
Anesthesia Evaluation    Airway        Dental   Pulmonary Current Smoker,           Cardiovascular      Neuro/Psych    GI/Hepatic   Endo/Other    Renal/GU      Musculoskeletal   Abdominal   Peds  Hematology   Anesthesia Other Findings   Reproductive/Obstetrics                             Anesthesia Physical Anesthesia Plan  ASA:   Anesthesia Plan:    Post-op Pain Management:    Induction:   PONV Risk Score and Plan:   Airway Management Planned:   Additional Equipment:   Intra-op Plan:   Post-operative Plan:   Informed Consent:   Plan Discussed with:   Anesthesia Plan Comments: (PAT note written 07/11/2021 by Myra Gianotti, PA-C. )        Anesthesia Quick Evaluation

## 2021-07-12 ENCOUNTER — Ambulatory Visit (HOSPITAL_COMMUNITY): Payer: No Typology Code available for payment source | Admitting: Vascular Surgery

## 2021-07-12 ENCOUNTER — Encounter: Payer: Self-pay | Admitting: Orthopedic Surgery

## 2021-07-12 ENCOUNTER — Other Ambulatory Visit: Payer: Self-pay

## 2021-07-12 ENCOUNTER — Ambulatory Visit (HOSPITAL_BASED_OUTPATIENT_CLINIC_OR_DEPARTMENT_OTHER): Payer: No Typology Code available for payment source | Admitting: Vascular Surgery

## 2021-07-12 ENCOUNTER — Encounter: Payer: No Typology Code available for payment source | Admitting: Physical Therapy

## 2021-07-12 ENCOUNTER — Ambulatory Visit (HOSPITAL_COMMUNITY)
Admission: RE | Admit: 2021-07-12 | Discharge: 2021-07-12 | Disposition: A | Discharge status: Home / Self Care | Payer: No Typology Code available for payment source | Attending: Orthopedic Surgery | Admitting: Orthopedic Surgery

## 2021-07-12 ENCOUNTER — Encounter (HOSPITAL_COMMUNITY)
Admission: RE | Disposition: A | Discharge status: Home / Self Care | Payer: Self-pay | Source: Home / Self Care | Attending: Orthopedic Surgery

## 2021-07-12 ENCOUNTER — Encounter (HOSPITAL_COMMUNITY): Payer: Self-pay | Admitting: Orthopedic Surgery

## 2021-07-12 DIAGNOSIS — M24661 Ankylosis, right knee: Secondary | ICD-10-CM

## 2021-07-12 DIAGNOSIS — F17210 Nicotine dependence, cigarettes, uncomplicated: Secondary | ICD-10-CM | POA: Diagnosis not present

## 2021-07-12 DIAGNOSIS — Z96651 Presence of right artificial knee joint: Secondary | ICD-10-CM | POA: Insufficient documentation

## 2021-07-12 DIAGNOSIS — D6801 Von willebrand disease, type 1: Secondary | ICD-10-CM | POA: Diagnosis not present

## 2021-07-12 HISTORY — DX: Other specified postprocedural states: Z98.890

## 2021-07-12 HISTORY — PX: EXAM UNDER ANESTHESIA WITH MANIPULATION OF KNEE: SHX5816

## 2021-07-12 HISTORY — DX: Other specified postprocedural states: R11.2

## 2021-07-12 SURGERY — MANIPULATION, JOINT, KNEE, WITH ANESTHESIA
Anesthesia: General | Site: Knee | Laterality: Right

## 2021-07-12 MED ORDER — MIDAZOLAM HCL 2 MG/2ML IJ SOLN
INTRAMUSCULAR | Status: AC
  Filled 2021-07-12: qty 2

## 2021-07-12 MED ORDER — HYDROMORPHONE HCL 1 MG/ML IJ SOLN
INTRAMUSCULAR | Status: DC | PRN
  Administered 2021-07-12: .5 mg via INTRAVENOUS

## 2021-07-12 MED ORDER — CHLORHEXIDINE GLUCONATE 0.12 % MT SOLN
15.0000 mL | Freq: Once | OROMUCOSAL | Status: AC
  Administered 2021-07-12: 15 mL via OROMUCOSAL
  Filled 2021-07-12: qty 15

## 2021-07-12 MED ORDER — CLONIDINE HCL (ANALGESIA) 100 MCG/ML EP SOLN
EPIDURAL | Status: AC
  Filled 2021-07-12: qty 10

## 2021-07-12 MED ORDER — LACTATED RINGERS IV SOLN: INTRAVENOUS | Status: DC

## 2021-07-12 MED ORDER — MEPERIDINE HCL 25 MG/ML IJ SOLN: 6.2500 mg | INTRAMUSCULAR | Status: DC | PRN

## 2021-07-12 MED ORDER — POVIDONE-IODINE 7.5 % EX SOLN
2.0000 | Freq: Once | CUTANEOUS | Status: DC
  Filled 2021-07-12: qty 118

## 2021-07-12 MED ORDER — MIDAZOLAM HCL 2 MG/2ML IJ SOLN
INTRAMUSCULAR | Status: AC
Start: 2021-07-12 — End: ?
  Filled 2021-07-12: qty 2

## 2021-07-12 MED ORDER — PHENYLEPHRINE 80 MCG/ML (10ML) SYRINGE FOR IV PUSH (FOR BLOOD PRESSURE SUPPORT)
PREFILLED_SYRINGE | INTRAVENOUS | Status: AC
Start: 2021-07-12 — End: ?
  Filled 2021-07-12: qty 10

## 2021-07-12 MED ORDER — FENTANYL CITRATE (PF) 250 MCG/5ML IJ SOLN
INTRAMUSCULAR | Status: AC
  Filled 2021-07-12: qty 5

## 2021-07-12 MED ORDER — MIDAZOLAM HCL 2 MG/2ML IJ SOLN
0.5000 mg | Freq: Once | INTRAMUSCULAR | Status: AC | PRN
  Administered 2021-07-12: 1 mg via INTRAVENOUS

## 2021-07-12 MED ORDER — HYDROMORPHONE HCL 1 MG/ML IJ SOLN
INTRAMUSCULAR | Status: AC
  Filled 2021-07-12: qty 0.5

## 2021-07-12 MED ORDER — PHENYLEPHRINE 80 MCG/ML (10ML) SYRINGE FOR IV PUSH (FOR BLOOD PRESSURE SUPPORT)
PREFILLED_SYRINGE | INTRAVENOUS | Status: DC | PRN
  Administered 2021-07-12: 220 ug via INTRAVENOUS

## 2021-07-12 MED ORDER — POVIDONE-IODINE 10 % EX SWAB: 2.0000 "application " | Freq: Once | CUTANEOUS | Status: DC

## 2021-07-12 MED ORDER — PROPOFOL 10 MG/ML IV BOLUS
INTRAVENOUS | Status: DC | PRN
  Administered 2021-07-12: 140 mg via INTRAVENOUS

## 2021-07-12 MED ORDER — LIDOCAINE 2% (20 MG/ML) 5 ML SYRINGE
INTRAMUSCULAR | Status: DC | PRN
  Administered 2021-07-12: 60 mg via INTRAVENOUS

## 2021-07-12 MED ORDER — OXYCODONE HCL 5 MG PO TABS: 5.0000 mg | ORAL_TABLET | ORAL | 0 refills | Status: AC | PRN

## 2021-07-12 MED ORDER — SCOPOLAMINE 1 MG/3DAYS TD PT72
MEDICATED_PATCH | TRANSDERMAL | Status: AC
  Administered 2021-07-12: 1.5 mg via TRANSDERMAL
  Filled 2021-07-12: qty 1

## 2021-07-12 MED ORDER — OXYCODONE HCL 5 MG PO TABS
ORAL_TABLET | ORAL | Status: AC
  Filled 2021-07-12: qty 1

## 2021-07-12 MED ORDER — HYDROMORPHONE HCL 1 MG/ML IJ SOLN
INTRAMUSCULAR | Status: AC
  Filled 2021-07-12: qty 1

## 2021-07-12 MED ORDER — LIDOCAINE 2% (20 MG/ML) 5 ML SYRINGE
INTRAMUSCULAR | Status: AC
  Filled 2021-07-12: qty 5

## 2021-07-12 MED ORDER — MORPHINE SULFATE (PF) 4 MG/ML IV SOLN
INTRAVENOUS | Status: DC | PRN
  Administered 2021-07-12: 8 mg

## 2021-07-12 MED ORDER — TRANEXAMIC ACID-NACL 1000-0.7 MG/100ML-% IV SOLN
1000.0000 mg | INTRAVENOUS | Status: AC
  Administered 2021-07-12: 1000 mg via INTRAVENOUS
  Filled 2021-07-12: qty 100

## 2021-07-12 MED ORDER — BUPIVACAINE-EPINEPHRINE (PF) 0.25% -1:200000 IJ SOLN
INTRAMUSCULAR | Status: DC | PRN
  Administered 2021-07-12: 30 mL

## 2021-07-12 MED ORDER — PROPOFOL 10 MG/ML IV BOLUS
INTRAVENOUS | Status: AC
  Filled 2021-07-12: qty 20

## 2021-07-12 MED ORDER — BUPIVACAINE-EPINEPHRINE (PF) 0.25% -1:200000 IJ SOLN
INTRAMUSCULAR | Status: AC
  Filled 2021-07-12: qty 30

## 2021-07-12 MED ORDER — DEXAMETHASONE SODIUM PHOSPHATE 10 MG/ML IJ SOLN
INTRAMUSCULAR | Status: DC | PRN
  Administered 2021-07-12: 5 mg via INTRAVENOUS

## 2021-07-12 MED ORDER — FENTANYL CITRATE (PF) 250 MCG/5ML IJ SOLN
INTRAMUSCULAR | Status: DC | PRN
  Administered 2021-07-12: 100 ug via INTRAVENOUS

## 2021-07-12 MED ORDER — ONDANSETRON HCL 4 MG/2ML IJ SOLN
INTRAMUSCULAR | Status: AC
Start: 2021-07-12 — End: ?
  Filled 2021-07-12: qty 2

## 2021-07-12 MED ORDER — FENTANYL CITRATE (PF) 100 MCG/2ML IJ SOLN: 50.0000 ug | INTRAMUSCULAR | Status: DC | PRN

## 2021-07-12 MED ORDER — SODIUM CHLORIDE 0.9 % IV SOLN
20.0000 ug | INTRAVENOUS | Status: DC
Start: 2021-07-13 — End: 2021-07-12
  Filled 2021-07-12: qty 5

## 2021-07-12 MED ORDER — FENTANYL CITRATE (PF) 100 MCG/2ML IJ SOLN
INTRAMUSCULAR | Status: AC
  Filled 2021-07-12: qty 2

## 2021-07-12 MED ORDER — HYDROMORPHONE HCL 1 MG/ML IJ SOLN
0.2500 mg | INTRAMUSCULAR | Status: DC | PRN
  Administered 2021-07-12 (×4): 0.5 mg via INTRAVENOUS

## 2021-07-12 MED ORDER — CLONIDINE HCL (ANALGESIA) 100 MCG/ML EP SOLN
EPIDURAL | Status: DC | PRN
  Administered 2021-07-12: 1 mL

## 2021-07-12 MED ORDER — OXYCODONE HCL 5 MG PO TABS
5.0000 mg | ORAL_TABLET | Freq: Once | ORAL | Status: AC | PRN
  Administered 2021-07-12: 5 mg via ORAL

## 2021-07-12 MED ORDER — ACETAMINOPHEN 500 MG PO TABS
1000.0000 mg | ORAL_TABLET | Freq: Once | ORAL | Status: DC
  Filled 2021-07-12: qty 2

## 2021-07-12 MED ORDER — ONDANSETRON HCL 4 MG/2ML IJ SOLN
INTRAMUSCULAR | Status: DC | PRN
  Administered 2021-07-12: 4 mg via INTRAVENOUS

## 2021-07-12 MED ORDER — MORPHINE SULFATE (PF) 4 MG/ML IV SOLN
INTRAVENOUS | Status: AC
  Filled 2021-07-12: qty 2

## 2021-07-12 MED ORDER — MIDAZOLAM HCL 2 MG/2ML IJ SOLN
INTRAMUSCULAR | Status: DC | PRN
  Administered 2021-07-12 (×2): 2 mg via INTRAVENOUS

## 2021-07-12 MED ORDER — OXYCODONE HCL 5 MG/5ML PO SOLN: 5.0000 mg | Freq: Once | ORAL | Status: AC | PRN

## 2021-07-12 MED ORDER — DEXAMETHASONE SODIUM PHOSPHATE 10 MG/ML IJ SOLN
INTRAMUSCULAR | Status: AC
Start: 2021-07-12 — End: ?
  Filled 2021-07-12: qty 1

## 2021-07-12 MED ORDER — ORAL CARE MOUTH RINSE: 15.0000 mL | Freq: Once | OROMUCOSAL | Status: AC

## 2021-07-12 MED ORDER — SCOPOLAMINE 1 MG/3DAYS TD PT72: 1.0000 | MEDICATED_PATCH | TRANSDERMAL | Status: DC

## 2021-07-12 SURGICAL SUPPLY — 8 items
ALCOHOL 70% 16 OZ (MISCELLANEOUS) ×1 IMPLANT
APPLICATOR CHLORAPREP 10.5 ORG (MISCELLANEOUS) ×1 IMPLANT
BNDG ADH 1X3 SHEER STRL LF (GAUZE/BANDAGES/DRESSINGS) ×1 IMPLANT
BNDG ELASTIC 4X5.8 VLCR STR LF (GAUZE/BANDAGES/DRESSINGS) ×1 IMPLANT
GAUZE SPONGE 4X4 12PLY STRL (GAUZE/BANDAGES/DRESSINGS) ×1 IMPLANT
NDL 18GX1X1/2 (RX/OR ONLY) (NEEDLE) IMPLANT
NEEDLE 18GX1X1/2 (RX/OR ONLY) (NEEDLE) ×4 IMPLANT
SYR 30ML LL (SYRINGE) ×1 IMPLANT

## 2021-07-12 NOTE — Transfer of Care (Signed)
Immediate Anesthesia Transfer of Care Note  Patient: Lance Galas Wright-Borso  Procedure(s) Performed: RIGHT EXAM UNDER ANESTHESIA WITH MANIPULATION OF KNEE (Right: Knee)  Patient Location: PACU  Anesthesia Type:General  Level of Consciousness: awake and alert   Airway & Oxygen Therapy: Patient Spontanous Breathing  Post-op Assessment: Report given to RN and Post -op Vital signs reviewed and stable  Post vital signs: Reviewed and stable  Last Vitals:  Vitals Value Taken Time  BP 141/65 07/12/21 1555  Temp 36.6 C 07/12/21 1555  Pulse 116 07/12/21 1603  Resp 21 07/12/21 1603  SpO2 98 % 07/12/21 1603  Vitals shown include unvalidated device data.  Last Pain:  Vitals:   07/12/21 1555  TempSrc:   PainSc: 0-No pain      Patients Stated Pain Goal: 0 (70/96/43 8381)  Complications: No notable events documented.

## 2021-07-12 NOTE — Op Note (Signed)
NAME: KAO, BERKHEIMER MEDICAL RECORD NO: [id number] ACCOUNT NO: [id number] DATE OF BIRTH: [birth year] FACILITY: [facility] LOCATION: [facility] PHYSICIAN: Yetta Barre. Marlou Sa, MD  Operative Report   DATE OF PROCEDURE: 07/12/2021  PREOPERATIVE DIAGNOSIS:  Right knee arthrofibrosis.  POSTOPERATIVE DIAGNOSIS:  Right knee arthrofibrosis.  PROCEDURE:  Right knee manipulation under anesthesia with injection.  SURGEON:  Yetta Barre. Marlou Sa, MD  ASSISTANT:  Annie Main, PA.  INDICATIONS:  The patient is a 51 year old patient who is now 6 weeks out from right total knee replacement with stiffness.  Presents for operative management and manipulation under anesthesia after explanation of risks and benefits.  DESCRIPTION OF PROCEDURE:  The patient was brought to the operating room where general anesthetic was induced.  Preoperative IV antibiotics were not administered.  TXA was administered.  Right knee was examined under anesthesia and found to have good  stability to varus and valgus stress at 0 and 30 degrees.  Pre-manipulation range of motion was 0-60.  Minimizing the fulcrum length the knee was flexed and scar tissue was broken off and we achieved a flexion of 125, which was photographed.  The knee  was then taken out into extension and she lacks about 5 degrees of full extension.  After sterile prepping with alcohol and ChloraPrep the knee was injected with 30 mL of Marcaine with epinephrine, clonidine and Morphine.  A Band-Aid and Ace wrap  applied.  She will resume CPM machine 6 hours a day.  The patient tolerated the procedure well without immediate complications. She was transferred to the recovery room in stable condition.  Luke's assistance was required for manipulation and  stabilization as well as injection of the knee.  His assistance was a medical necessity.   PUS D: 07/12/2021 4:29:33 pm T: 07/12/2021 6:38:00 pm  JOB: [id number]/ [id number]

## 2021-07-12 NOTE — Anesthesia Procedure Notes (Signed)
Procedure Name: General with mask airway Date/Time: 07/12/2021 3:43 PM  Performed by: Reece Agar, CRNAPre-anesthesia Checklist: Patient identified, Emergency Drugs available, Suction available, Patient being monitored and Timeout performed Patient Re-evaluated:Patient Re-evaluated prior to induction Oxygen Delivery Method: Circle system utilized Preoxygenation: Pre-oxygenation with 100% oxygen Induction Type: IV induction Ventilation: Mask ventilation without difficulty

## 2021-07-12 NOTE — H&P (Signed)
Cheryl Tate is an 51 y.o. female.   Chief Complaint: Right knee stiffness HPI: Cheryl Tate is a 51 year old patient with right total knee replacement.  Having trouble achieving flexion.  Knee replacement done approximately 6 weeks ago.  Has achieved good extension but only 50 to 60 degrees of flexion.  Presents now for operative management after estimation of risk and benefits.  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  Past Medical History:  Diagnosis Date   Anemia    Depression    Menorrhagia    Mood disorder ([facility])    PONV (postoperative nausea and vomiting)    Von Willebrand's disease [facility])    followed by hemotologist Dr. Marin Olp    Past Surgical History:  Procedure Laterality Date   APPENDECTOMY  01/22/2004   DILATION AND CURETTAGE OF UTERUS  2002   x 2 in 2002   DILITATION & CURRETTAGE/HYSTROSCOPY WITH HYDROTHERMAL ABLATION N/A 11/22/2014   Procedure: HYSTEROSCOPY WITH HYDROTHERMAL ABLATION;  Surgeon: Cheryl Queen, MD;  Location: [place];  Service: Gynecology;  Laterality: N/A;   HYSTEROSCOPY WITH D & C  08-08-2014  in [place]   MULTIPLE TOOTH EXTRACTIONS     TOTAL KNEE ARTHROPLASTY Right 05/22/2021   Procedure: RIGHT TOTAL KNEE ARTHROPLASTY;  Surgeon: Cheryl Pel, MD;  Location: [place];  Service: Orthopedics;  Laterality: Right;    Family History  Problem Relation Age of Onset   Mental retardation Mother    Mental retardation Brother    Mental retardation Maternal Grandmother    Social History:  reports that she has been smoking cigarettes. She has a 5.00 pack-year smoking history. She has never used smokeless tobacco. She reports current alcohol use of about 7.0 standard drinks of alcohol per week. She reports current drug use. Drug: Marijuana.  Allergies:  Allergies  Allergen Reactions   Bee Venom Swelling   Dilaudid [Hydromorphone] Nausea And Vomiting   Nsaids     Avoid due to von willebrand disease    Tramadol     Migraines and  Hallucinations   Benadryl [Diphenhydramine] Palpitations    Medications Prior to Admission  Medication Sig Dispense Refill   acetaminophen (TYLENOL) 500 MG tablet Take 2 tablets (1,000 mg total) by mouth every 8 (eight) hours as needed for moderate pain. 60 tablet 2   Ascorbic Acid (VITAMIN C) 1000 MG tablet Take 1,000 mg by mouth daily.     CALCIUM PO Take 1 tablet by mouth daily. Raw calcium with other vitamins     Cholecalciferol (VITAMIN D3) 125 MCG (5000 UT) CAPS TAKE ONE CAPSULE BY MOUTH DAILY 100 capsule PRN   Cobalamin Combinations (B12 FOLATE) 800-800 MCG CAPS Take 1 tablet by mouth daily.     CVS TRIPLE MAGNESIUM COMPLEX PO Take 1 tablet by mouth at bedtime.     EPINEPHrine 0.3 mg/0.3 mL IJ SOAJ injection Inject 0.3 mg into the muscle as needed for anaphylaxis.     Ferrous Sulfate (IRON PO) Take 1 tablet by mouth daily. Hemangenics Iron     fluticasone (FLONASE) 50 MCG/ACT nasal spray Place 1 spray into both nostrils daily as needed for allergies or rhinitis.     gabapentin (NEURONTIN) 300 MG capsule Take 1 capsule (300 mg total) by mouth 2 (two) times daily. 60 capsule 0   Glucosamine-Chondroit-Vit C-Mn (GLUCOSAMINE 1500 COMPLEX PO) Take 1,500 mg by mouth daily.     levocetirizine (XYZAL) 5 MG tablet Take 5 mg by mouth daily as needed for allergies.  methocarbamol (ROBAXIN) 500 MG tablet Take 1 tablet (500 mg total) by mouth every 8 (eight) hours as needed for muscle spasms. 30 tablet 0   Naphazoline-Pheniramine (VISINE-A OP) Place 1 drop into both eyes daily as needed (allergies).     valproic acid (DEPAKENE) 250 MG capsule Take 250 mg by mouth at bedtime.     estradiol (ESTRACE) 0.5 MG tablet Take 0.5 mg by mouth daily.     progesterone (PROMETRIUM) 200 MG capsule Take 200 mg by mouth at bedtime.      No results found for this or any previous visit (from the past 48 hour(s)). No results found.  Review of Systems  Musculoskeletal:  Positive for arthralgias.  All other  systems reviewed and are negative.   Blood pressure (!) 143/90, pulse 76, temperature 98 F (36.7 C), temperature source Oral, resp. rate 18, height 5' 2"  (1.575 m), weight 70.8 kg, SpO2 97 %. Physical Exam Vitals reviewed.  HENT:     Head: Normocephalic.     Nose: Nose normal.     Mouth/Throat:     Mouth: Mucous membranes are moist.  Eyes:     Pupils: Pupils are equal, round, and reactive to light.  Cardiovascular:     Rate and Rhythm: Normal rate.     Pulses: Normal pulses.  Pulmonary:     Effort: Pulmonary effort is normal.  Abdominal:     General: Abdomen is flat.  Musculoskeletal:     Cervical back: Normal range of motion.  Skin:    General: Skin is warm.     Capillary Refill: Capillary refill takes less than 2 seconds.  Neurological:     General: No focal deficit present.     Mental Status: She is alert.  Psychiatric:        Mood and Affect: Mood normal.   Examination of the right knee demonstrates palpable pedal pulses with intact extensor mechanism.  Patella mobility decreased.  Range of motion is about 0 to 60 degrees.  No groin pain with internal/external rotation  Assessment/Plan Impression is right knee arthrofibrosis.  Components appear well-positioned and well sized.  Plan is manipulation under anesthesia with preoperative DDAVP along with preoperative TXA and intra-articular Marcaine morphine and clonidine injected.  The risk and benefits of the procedure discussed with the patient include not limited to infection nerve vessel damage incomplete restoration of flexion as well as fracture.  Patient understands and wishes to proceed.  All questions answered  Cheryl Malta, MD 07/12/2021, 3:21 PM

## 2021-07-12 NOTE — Anesthesia Postprocedure Evaluation (Signed)
Anesthesia Post Note  Patient: Cheryl Tate  Procedure(s) Performed: RIGHT EXAM UNDER ANESTHESIA WITH MANIPULATION OF KNEE (Right: Knee)     Patient location during evaluation: PACU Anesthesia Type: General Level of consciousness: awake and alert, patient cooperative and oriented Pain management: pain level controlled (pain improving) Vital Signs Assessment: post-procedure vital signs reviewed and stable Respiratory status: spontaneous breathing, nonlabored ventilation and respiratory function stable Cardiovascular status: blood pressure returned to baseline and stable Postop Assessment: no apparent nausea or vomiting and adequate PO intake Anesthetic complications: no   No notable events documented.  Last Vitals:  Vitals:   07/12/21 1625 07/12/21 1640  BP: 134/80 (!) 144/79  Pulse: 95 92  Resp: 13 11  Temp:  36.7 C  SpO2: 98% 97%    Last Pain:  Vitals:   07/12/21 1609  TempSrc:   PainSc: 10-Worst pain ever                 Hassell Patras,E. Alexei Doswell

## 2021-07-12 NOTE — Brief Op Note (Signed)
   07/12/2021  4:26 PM  PATIENT:  Marsh Dolly Wright-Borso  51 y.o. female  PRE-OPERATIVE DIAGNOSIS:  RIGHT KNEE ARTHROFIBROSIS  POST-OPERATIVE DIAGNOSIS:  RIGHT KNEE ARTHROFIBROSIS  PROCEDURE:  Procedure(s): RIGHT EXAM UNDER ANESTHESIA WITH MANIPULATION OF KNEE  SURGEON:  Surgeon(s): Marlou Sa, Tonna Corner, MD  ASSISTANT: magnant pa  ANESTHESIA:   general  EBL: 0 ml    Total I/O In: 400 [I.V.:300; IV Piggyback:100] Out: 0   BLOOD ADMINISTERED: none  DRAINS: none   LOCAL MEDICATIONS USED:  marcaine mso4 clonidine  SPECIMEN:  No Specimen  COUNTS:  YES  TOURNIQUET:  * No tourniquets in log *  DICTATION: .Other Dictation: Dictation Number [id number]  PLAN OF CARE: Discharge to home after PACU  PATIENT DISPOSITION:  PACU - hemodynamically stable              0

## 2021-07-13 ENCOUNTER — Ambulatory Visit (INDEPENDENT_AMBULATORY_CARE_PROVIDER_SITE_OTHER): Payer: No Typology Code available for payment source | Admitting: Surgical

## 2021-07-13 ENCOUNTER — Ambulatory Visit (INDEPENDENT_AMBULATORY_CARE_PROVIDER_SITE_OTHER): Payer: No Typology Code available for payment source | Admitting: Physical Therapy

## 2021-07-13 ENCOUNTER — Encounter: Payer: Self-pay | Admitting: Orthopedic Surgery

## 2021-07-13 ENCOUNTER — Ambulatory Visit (INDEPENDENT_AMBULATORY_CARE_PROVIDER_SITE_OTHER): Payer: No Typology Code available for payment source

## 2021-07-13 ENCOUNTER — Encounter (HOSPITAL_COMMUNITY): Payer: Self-pay | Admitting: Orthopedic Surgery

## 2021-07-13 DIAGNOSIS — R600 Localized edema: Secondary | ICD-10-CM | POA: Diagnosis not present

## 2021-07-13 DIAGNOSIS — M25561 Pain in right knee: Secondary | ICD-10-CM | POA: Diagnosis not present

## 2021-07-13 DIAGNOSIS — R2689 Other abnormalities of gait and mobility: Secondary | ICD-10-CM

## 2021-07-13 DIAGNOSIS — M6281 Muscle weakness (generalized): Secondary | ICD-10-CM | POA: Diagnosis not present

## 2021-07-13 DIAGNOSIS — Z96659 Presence of unspecified artificial knee joint: Secondary | ICD-10-CM | POA: Diagnosis not present

## 2021-07-13 DIAGNOSIS — M25661 Stiffness of right knee, not elsewhere classified: Secondary | ICD-10-CM | POA: Diagnosis not present

## 2021-07-13 DIAGNOSIS — R2681 Unsteadiness on feet: Secondary | ICD-10-CM

## 2021-07-13 MED ORDER — GABAPENTIN 300 MG PO CAPS: 300.0000 mg | ORAL_CAPSULE | Freq: Three times a day (TID) | ORAL | 0 refills | Status: DC

## 2021-07-13 NOTE — Telephone Encounter (Signed)
Patient was seen in office this morning as a work in

## 2021-07-16 ENCOUNTER — Ambulatory Visit (INDEPENDENT_AMBULATORY_CARE_PROVIDER_SITE_OTHER): Payer: No Typology Code available for payment source | Admitting: Physical Therapy

## 2021-07-16 ENCOUNTER — Encounter: Payer: Self-pay | Admitting: Physical Therapy

## 2021-07-16 DIAGNOSIS — R600 Localized edema: Secondary | ICD-10-CM

## 2021-07-16 DIAGNOSIS — M25561 Pain in right knee: Secondary | ICD-10-CM | POA: Diagnosis not present

## 2021-07-16 DIAGNOSIS — R2689 Other abnormalities of gait and mobility: Secondary | ICD-10-CM

## 2021-07-16 DIAGNOSIS — M6281 Muscle weakness (generalized): Secondary | ICD-10-CM

## 2021-07-16 DIAGNOSIS — M25661 Stiffness of right knee, not elsewhere classified: Secondary | ICD-10-CM

## 2021-07-17 ENCOUNTER — Ambulatory Visit (INDEPENDENT_AMBULATORY_CARE_PROVIDER_SITE_OTHER): Payer: No Typology Code available for payment source | Admitting: Physical Therapy

## 2021-07-17 ENCOUNTER — Encounter: Payer: Self-pay | Admitting: Physical Therapy

## 2021-07-17 DIAGNOSIS — R2689 Other abnormalities of gait and mobility: Secondary | ICD-10-CM

## 2021-07-17 DIAGNOSIS — M25561 Pain in right knee: Secondary | ICD-10-CM | POA: Diagnosis not present

## 2021-07-17 DIAGNOSIS — M25661 Stiffness of right knee, not elsewhere classified: Secondary | ICD-10-CM

## 2021-07-17 DIAGNOSIS — R600 Localized edema: Secondary | ICD-10-CM

## 2021-07-17 DIAGNOSIS — M6281 Muscle weakness (generalized): Secondary | ICD-10-CM | POA: Diagnosis not present

## 2021-07-17 DIAGNOSIS — R2681 Unsteadiness on feet: Secondary | ICD-10-CM

## 2021-07-18 ENCOUNTER — Ambulatory Visit (INDEPENDENT_AMBULATORY_CARE_PROVIDER_SITE_OTHER): Payer: No Typology Code available for payment source | Admitting: Physical Therapy

## 2021-07-18 ENCOUNTER — Encounter: Payer: Self-pay | Admitting: Orthopedic Surgery

## 2021-07-18 ENCOUNTER — Encounter: Payer: No Typology Code available for payment source | Admitting: Orthopedic Surgery

## 2021-07-18 ENCOUNTER — Encounter: Payer: Self-pay | Admitting: Physical Therapy

## 2021-07-18 DIAGNOSIS — R600 Localized edema: Secondary | ICD-10-CM | POA: Diagnosis not present

## 2021-07-18 DIAGNOSIS — M25661 Stiffness of right knee, not elsewhere classified: Secondary | ICD-10-CM

## 2021-07-18 DIAGNOSIS — M25561 Pain in right knee: Secondary | ICD-10-CM | POA: Diagnosis not present

## 2021-07-18 DIAGNOSIS — M6281 Muscle weakness (generalized): Secondary | ICD-10-CM

## 2021-07-18 DIAGNOSIS — R2689 Other abnormalities of gait and mobility: Secondary | ICD-10-CM

## 2021-07-18 NOTE — Therapy (Signed)
OUTPATIENT PHYSICAL THERAPY TREATMENT NOTE   Patient Name: Cheryl Tate MRN: [id number] DOB:[birth year], 51 y.o., female Today's Date: 07/18/2021   END OF SESSION:   PT End of Session - 07/18/21 1531     Visit Number 21    Number of Visits 36    Date for PT Re-Evaluation 08/31/21    Authorization Type UHC    Authorization Time Period [facility] CHOICE  NO VISIT LIMIT, PATIENT PAYS 100% UNTIL DED IS MET, DED $7500 [id number] MET, TERM DATE 09/19/2021    Progress Note Due on Visit 26    PT Start Time 1518    PT Stop Time 1603    PT Time Calculation (min) 45 min    Activity Tolerance Patient limited by pain    Behavior During Therapy [facility] for tasks assessed/performed                        Past Medical History:  Diagnosis Date   Anemia    Depression    Menorrhagia    Mood disorder (HCC)    PONV (postoperative nausea and vomiting)    Von Willebrand's disease ([place])    followed by hemotologist Dr. Marin Olp   Past Surgical History:  Procedure Laterality Date   APPENDECTOMY  01/22/2004   DILATION AND CURETTAGE OF UTERUS  2002   x 2 in 2002   DILITATION & CURRETTAGE/HYSTROSCOPY WITH HYDROTHERMAL ABLATION N/A 11/22/2014   Procedure: HYSTEROSCOPY WITH HYDROTHERMAL ABLATION;  Surgeon: Dian Queen, MD;  Location: [place];  Service: Gynecology;  Laterality: N/A;   EXAM UNDER ANESTHESIA WITH MANIPULATION OF KNEE Right 07/12/2021   Procedure: RIGHT EXAM UNDER ANESTHESIA WITH MANIPULATION OF KNEE;  Surgeon: Meredith Pel, MD;  Location: [place];  Service: Orthopedics;  Laterality: Right;   HYSTEROSCOPY WITH D & C  08-08-2014  in [place]   MULTIPLE TOOTH EXTRACTIONS     TOTAL KNEE ARTHROPLASTY Right 05/22/2021   Procedure: RIGHT TOTAL KNEE ARTHROPLASTY;  Surgeon: Meredith Pel, MD;  Location: [place];  Service: Orthopedics;  Laterality: Right;   Patient Active Problem List   Diagnosis Date Noted   Arthritis of right knee    S/P knee replacement  05/22/2021   BMI 33.0-33.9,adult 06/16/2015   Von Willebrand disease ([place]) 06/13/2015   Depression 12/29/2014   Rhinitis, allergic 12/28/2014    PCP: Eunice Blase, MD   REFERRING PROVIDER: Donella Stade, PA-C   REFERRING DIAG: [phone] (ICD-10-CM) - History of total knee arthroplasty, unspecified laterality  ONSET DATE: 05/22/2021 Right TKA   THERAPY DIAG:  Stiffness of right knee, not elsewhere classified  Acute pain of right knee  Localized edema  Muscle weakness (generalized)  Other abnormalities of gait and mobility  PERTINENT HISTORY: Von Willebrand disease, depression, Menorrhgia  PRECAUTIONS: None  SUBJECTIVE:  She had bad night with pain.  She took Oxy & tylenol ~ 1hr apart prior to sleeping.   PAIN:  NPRS scale:  7-8/10 this morning, up to 10/10 with weight bearing Pain location: right knee all over.  Pain description: sharp, sore, cramps Aggravating factors: fall Relieving factors: ice, meds   OBJECTIVE:    PATIENT SURVEYS:  05/28/2021 FOTO 29% functional & target 67%   COGNITION: 05/28/2021 Overall cognitive status: Within functional limits for tasks assessed                EDEMA: 05/28/2021 RLE above knee 51.8cm around knee 48cm below knee  41.2cm LLE above knee 45.8cm  around knee 40cm below knee 34.1cm   PALPATION: 05/28/2021 Bruising along medial knee & lower leg, tenderness over joint line, quads, hamstrings   LE ROM:   ROM P: Passive  A: Active Right 05/28/21 Right 05/31/21 Right 06/05/21 Right 06/06/21 Right 06/11/21 Right 06/14/21 Right 06/19/21 Right 06/26/21 Right 06/28/21 Right 07/02/21 Right 07/13/21 Right 07/19/21  Hip flexion               Hip extension               Hip abduction               Hip adduction               Hip internal rotation               Hip external rotation               Knee flexion Supine P: 64* Seated  A: 64* Supine AROM heel slide:61 PROM overpressure in heel slide : 66   Supine AA: 73* Seated  P: 75*   Seated P: 79* Seated P: 75 Seated P: 70 with muscle guarding limiting Seated P:78 after manual therapy Seated P: 75*  A: 64* after manual therapy Supine A 70 P 78 AA 84 (after exercise) Seated P:85 After maual therapy  Knee extension Supine P: -10* Seated  A: -31* Seated AROM LAQ: -21 Seated LAQ A: -14* Supine P: -9* Supine A: -8 Supine P: -7* Supine A: -8 P:-6 Supine A:-8 P:-5  Seated P: -7* after manual therapy Seated  A -16 (seated LAQ)   Ankle dorsiflexion               Ankle plantarflexion               Ankle inversion               Ankle eversion                (Blank rows = not tested)   LE MMT:   MMT Right 05/28/2021 Left 05/28/2021  Hip flexion      Hip extension      Hip abduction      Hip adduction      Hip internal rotation      Hip external rotation      Knee flexion 2/5   Knee extension 2/5   Ankle dorsiflexion      Ankle plantarflexion      Ankle inversion      Ankle eversion       (Blank rows = not tested)   GAIT: 05/28/2021 Distance walked: 42' Assistive device utilized:  4 footed walker level of assistance: SBA  verbal cues needed Comments: antalgic, right knee flexed in stance, limited to no increase flexion for swing, RLE abducted,    Functional Activities: Pt using LLE to move RLE in & out of bed.  PT min guard RLE and she was able to move RLE without assisting with LLE.    TODAY'S TREATMENT: 07/18/21 -Nu step X 5 min LE/UE sea -Sci fit bike X 6 min and she was able to do full revolutions today -seated LAQ against resistance from other leg contracting 5 seconds and then stretching AAROM from other leg into knee flexion stretch 5 sec  X40, (contract-relax stretching) -Seated knee flexion stretch AAROM 5 sec X 10  -Manual therapy: Rt knee PROM with overpressure, grade 2-3 mobs for flexion and extension in both  supine and sitting (she appears to have less muscle guarding in sitting) --Supine knee extension heel prop stretch X 5 min  during first half of vaso -Vasopnuematic X 10 min medium compression 34 deg to Rt knee  07/17/2021 Seated LAQ & active knee flex with contralateral LE opposing motion 15 reps 2 sets. Heel slide with RLE on 8" ball with strap 10 reps PT assist including closed chain leg press / knee ext.  Progressed to RLE on 55cm ball with BLEs & strap assist 10 reps.  Supine hip flexor stretch RLE over edge with LLE hooklying position 30 sec 3 reps. Progressed to rt knee ext & flex AAROM with RLE over edge 10 reps.  PT attempted sidelying closed chain leg press but pt unable to tolerate.  Standing with RW support weight shifts onto RLE with 5 sec hold.   Pt ambulated with RW with PT cueing wt shift on RLE in stance, step through pattern & knee flexion in swing initiating in terminal stance.   Manual therapy: Rt knee PROM with overpressure and contract-relax for flexion seated & extension long sit. Soft tissue mob with pt prone.   Supine with hip 90* / femur vertical and PT PROM knee flexion.  Modalities Vasopnuematic X 10 min high compression 34 deg to Rt knee with elevation  07/16/21 -seated LAQ X 20 -Seated knee extension heel prop stretch X 4 min with heel in another chair -Seated knee flexion stretch AAROM 5 sec X 10 -Supine hamstring stretch 3 X 30 sec with strap -Supine heelslides AAROM 5 sec X 13 reps -Nu step X 6 min LE/UE seat #7  -Manual therapy: Rt knee PROM with overpressure, grade 2-3 mobs for flexion and extension  -Vasopnuematic X 10 min medium compression 34 deg to Rt knee  07/13/21 Therex: AA heel slides in supine and sitting x 10 min NuStep x 8 min working on flexion Manual: Sitting and supine knee flexion with LLLD holds       HOME EXERCISE PROGRAM: Access Code: [email or web address] URL: [email or web address] Date: 06/19/2021 Prepared by: Elsie Ra  Exercises - Quad Setting and Stretching  - 2-4 x daily - 7 x weekly - 5-10 sets - 10 reps - prop 5-10 minutes & quad  set5 seconds hold - Supine Heel Slide with Strap  - 2-3 x daily - 7 x weekly - 2-3 sets - 10 reps - 5 seconds hold - Seated Knee Flexion Stretch  - 2 x daily - 6 x weekly - 1-2 sets - 10 reps - 5 sec hold - Seated straight leg lifts  - 2-3 x daily - 7 x weekly - 2-3 sets - 10 reps - 5 seconds hold - Seated Hamstring Stretch with Strap  - 2-4 x daily - 7 x weekly - 1 sets - 3 reps - 20-30 seconds hold - Sit to Stand with Counter Support  - 2 x daily - 6 x weekly - 1-2 sets - 10 reps   ASSESSMENT:   CLINICAL IMPRESSION:  It remains difficult to stretch her due to guarding and pain associated, she appears to do better in sitting vs supine. She was at least able to do full circles on sci fit bike showing improved flexion ROM but then after we work to gain flexion she loses extension.    OBJECTIVE IMPAIRMENTS Abnormal gait, decreased activity tolerance, decreased balance, decreased knowledge of condition, decreased knowledge of use of DME, decreased mobility, difficulty walking, decreased ROM, decreased strength, increased edema, increased muscle spasms,  impaired flexibility, postural dysfunction, and pain.    ACTIVITY LIMITATIONS community activity, driving, and personal recreation like Yoga & walking dogs .    PERSONAL FACTORS 1-2 comorbidities: see PMH  are also affecting patient's functional outcome.    REHAB POTENTIAL: Good   CLINICAL DECISION MAKING: Stable/uncomplicated   EVALUATION COMPLEXITY: Low     GOALS: Goals reviewed with patient? Yes   SHORT TERM GOALS: Target date: 06/22/2021   Patient independent and verbalizes compliance with initial HEP Baseline: SEE OBJECTIVE DATA Goal status: on going 05/29/2021   2.  Patient reports 50% improvement in right knee pain. Baseline: SEE OBJECTIVE DATA Goal status: on going 05/29/2021   3.  PROM right knee extension -3* to flexion 90* Baseline: SEE OBJECTIVE DATA Goal status: on going 05/29/2021   LONG TERM GOALS: Target date: 08/31/2021    Patient will improve FOTO score to 67% Baseline: SEE OBJECTIVE DATA Goal status: ongoing 07/04/2021   2.  Patient reports right knee pain </= 2/10 with standing & gait activities.  Baseline: SEE OBJECTIVE DATA Goal status: ongoing 07/04/2021   3.  Right Knee PROM 0* extension to 110* flexion Baseline: SEE OBJECTIVE DATA Goal status: ongoing 07/04/2021   4.  Right Knee AROM seated -3* extension to 100* flexion Baseline: SEE OBJECTIVE DATA Goal status: ongoing 07/04/2021   5.  Patient ambulates >500' community distances including negotiating ramps, curbs & stairs without device independently. Baseline: SEE OBJECTIVE DATA Goal status: ongoing 07/04/2021   6.  Patient reports ability to perform Yoga & walk her dogs. Baseline: SEE OBJECTIVE DATA Goal status: ongoing 07/04/2021   PLAN: PT FREQUENCY: 3x/week of manipulation, 4x/wk following week, 2-3xwk for 6 weeks   PT DURATION: 8 additional weeks   PLANNED INTERVENTIONS: Therapeutic exercises, Therapeutic activity, Neuromuscular re-education, Balance training, Gait training, Patient/Family education, Joint mobilization, Stair training, Vestibular training, DME instructions, Electrical stimulation, Cryotherapy, Moist heat, scar mobilization, Taping, Vasopneumatic device, physical performance testing and Manual therapy   PLAN FOR NEXT SESSION:   manual therapy & exercise to progress range, we may need to focus on extension day one day and flexion day the next as she loses one when we gain the other.  Standing balance to facilitate weight bearing.    Elsie Ra, PT, DPT 07/18/21 4:03 PM

## 2021-07-19 ENCOUNTER — Encounter: Payer: Self-pay | Admitting: Physical Therapy

## 2021-07-19 ENCOUNTER — Ambulatory Visit (INDEPENDENT_AMBULATORY_CARE_PROVIDER_SITE_OTHER): Payer: No Typology Code available for payment source | Admitting: Physical Therapy

## 2021-07-19 DIAGNOSIS — R2689 Other abnormalities of gait and mobility: Secondary | ICD-10-CM

## 2021-07-19 DIAGNOSIS — M25661 Stiffness of right knee, not elsewhere classified: Secondary | ICD-10-CM | POA: Diagnosis not present

## 2021-07-19 DIAGNOSIS — R2681 Unsteadiness on feet: Secondary | ICD-10-CM

## 2021-07-19 DIAGNOSIS — R600 Localized edema: Secondary | ICD-10-CM

## 2021-07-19 DIAGNOSIS — M6281 Muscle weakness (generalized): Secondary | ICD-10-CM | POA: Diagnosis not present

## 2021-07-19 DIAGNOSIS — M25561 Pain in right knee: Secondary | ICD-10-CM | POA: Diagnosis not present

## 2021-07-19 NOTE — Telephone Encounter (Signed)
Thank you :)

## 2021-07-19 NOTE — Therapy (Signed)
OUTPATIENT PHYSICAL THERAPY TREATMENT NOTE   Patient Name: Cheryl Tate MRN: [id number] DOB:1970/05/23, 51 y.o., female Today's Date: 07/19/2021   END OF SESSION:   PT End of Session - 07/19/21 0803     Visit Number 22    Number of Visits 36    Date for PT Re-Evaluation 08/31/21    Authorization Type UHC    Authorization Time Period [facility] CHOICE  NO VISIT LIMIT, PATIENT PAYS 100% UNTIL DED IS MET, DED $7500 [id number] MET, TERM DATE 09/19/2021    Progress Note Due on Visit 26    PT Start Time 0800    PT Stop Time 0858    PT Time Calculation (min) 58 min    Activity Tolerance Patient limited by pain    Behavior During Therapy [facility] for tasks assessed/performed                         Past Medical History:  Diagnosis Date   Anemia    Depression    Menorrhagia    Mood disorder (HCC)    PONV (postoperative nausea and vomiting)    Von Willebrand's disease ([place])    followed by hemotologist Dr. Marin Olp   Past Surgical History:  Procedure Laterality Date   APPENDECTOMY  01/22/2004   DILATION AND CURETTAGE OF UTERUS  2002   x 2 in 2002   DILITATION & CURRETTAGE/HYSTROSCOPY WITH HYDROTHERMAL ABLATION N/A 11/22/2014   Procedure: HYSTEROSCOPY WITH HYDROTHERMAL ABLATION;  Surgeon: Dian Queen, MD;  Location: [place];  Service: Gynecology;  Laterality: N/A;   EXAM UNDER ANESTHESIA WITH MANIPULATION OF KNEE Right 07/12/2021   Procedure: RIGHT EXAM UNDER ANESTHESIA WITH MANIPULATION OF KNEE;  Surgeon: Meredith Pel, MD;  Location: [place];  Service: Orthopedics;  Laterality: Right;   HYSTEROSCOPY WITH D & C  08-08-2014  in [place]   MULTIPLE TOOTH EXTRACTIONS     TOTAL KNEE ARTHROPLASTY Right 05/22/2021   Procedure: RIGHT TOTAL KNEE ARTHROPLASTY;  Surgeon: Meredith Pel, MD;  Location: [place];  Service: Orthopedics;  Laterality: Right;   Patient Active Problem List   Diagnosis Date Noted   Arthritis of right knee    S/P knee replacement  05/22/2021   BMI 33.0-33.9,adult 06/16/2015   Von Willebrand disease ([place]) 06/13/2015   Depression 12/29/2014   Rhinitis, allergic 12/28/2014    PCP: Eunice Blase, MD   REFERRING PROVIDER: Donella Stade, PA-C   REFERRING DIAG: [phone] (ICD-10-CM) - History of total knee arthroplasty, unspecified laterality  ONSET DATE: 05/22/2021 Right TKA   THERAPY DIAG:  Stiffness of right knee, not elsewhere classified  Acute pain of right knee  Localized edema  Muscle weakness (generalized)  Other abnormalities of gait and mobility  Unsteadiness on feet  PERTINENT HISTORY: Von Willebrand disease, depression, Menorrhgia  PRECAUTIONS: None  SUBJECTIVE:  She did exercise on back last night.   PAIN:  NPRS scale: 3/10 but awoke with 8/10 this morning, up to 10/10 with weight bearing Pain location: right knee all over.  Pain description: sharp, sore, cramps Aggravating factors: fall Relieving factors: ice, meds   OBJECTIVE:    PATIENT SURVEYS:  05/28/2021 FOTO 29% functional & target 67%   COGNITION: 05/28/2021 Overall cognitive status: Within functional limits for tasks assessed                EDEMA: 05/28/2021 RLE above knee 51.8cm around knee 48cm below knee  41.2cm LLE above knee 45.8cm around knee 40cm  below knee 34.1cm   PALPATION: 05/28/2021 Bruising along medial knee & lower leg, tenderness over joint line, quads, hamstrings   LE ROM:   ROM P: Passive  A: Active Right 05/28/21 Right 05/31/21 Right 06/05/21 Right 06/06/21 Right 06/11/21 Right 06/14/21 Right 06/19/21 Right 06/26/21 Right 06/28/21 Right 07/02/21 Right 07/13/21 Right 07/19/21  Hip flexion               Hip extension               Hip abduction               Hip adduction               Hip internal rotation               Hip external rotation               Knee flexion Supine P: 64* Seated  A: 64* Supine AROM heel slide:61 PROM overpressure in heel slide : 66   Supine AA: 73* Seated  P: 75*   Seated P: 79* Seated P: 75 Seated P: 70 with muscle guarding limiting Seated P:78 after manual therapy Seated P: 75*  A: 64* after manual therapy Supine A 70 P 78 AA 84 (after exercise) Seated P:85 After maual therapy  Knee extension Supine P: -10* Seated  A: -31* Seated AROM LAQ: -21 Seated LAQ A: -14* Supine P: -9* Supine A: -8 Supine P: -7* Supine A: -8 P:-6 Supine A:-8 P:-5  Seated P: -7* after manual therapy Seated  A -16 (seated LAQ)   Ankle dorsiflexion               Ankle plantarflexion               Ankle inversion               Ankle eversion                (Blank rows = not tested)   LE MMT:   MMT Right 05/28/2021 Left 05/28/2021  Hip flexion      Hip extension      Hip abduction      Hip adduction      Hip internal rotation      Hip external rotation      Knee flexion 2/5   Knee extension 2/5   Ankle dorsiflexion      Ankle plantarflexion      Ankle inversion      Ankle eversion       (Blank rows = not tested)   GAIT: 05/28/2021 Distance walked: 9' Assistive device utilized:  4 footed walker level of assistance: SBA  verbal cues needed Comments: antalgic, right knee flexed in stance, limited to no increase flexion for swing, RLE abducted,    Functional Activities: Pt using LLE to move RLE in & out of bed.  PT min guard RLE and she was able to move RLE without assisting with LLE.    TODAY'S TREATMENT: 07/19/2021 Therapeutic Exercise: -Nu step X 6 min 5 sec hold flex & ext with LE/UE seat 5 -Sci fit bike seat 6 X 7 min level 1 and she was able to do full revolutions today -leg press BLEs 75# with focus on RLE ext. 10 reps 2 sets with PT manual assist end range ext and flex -seated heel slides with quad set ext / leg press with pt manual assist end range & flexion with end  range assist LLE 5 sec hold 20 reps. Pt verbalized understanding for home.  PT recommended alternating exercises that emphasize flex & ext. Pt verbalized  understanding.  Therapeutic Activities. Standing with RW feet even off back legs of RW: Weight shift right/left with 5 sec hold on RLE 10 reps 3 times during session upon arising.  Weight ant/post with pelvis midline 5 sec hold 10 reps. Gait with RW with cues on step through pattern. Pt performed with antalgic pattern but improved stance duration & knee ext during session.   -Manual therapy: Rt knee PROM with overpressure, grade 2-3 mobs for flexion and extension in both supine and sitting (she appears to have less muscle guarding in sitting)  Modalities: --Supine knee extension heel prop stretch X 5 min during first half of vaso -Vasopnuematic X 10 min medium compression 34 deg to Rt knee  07/18/21 Therapeutic Exercise: -Nu step X 5 min LE/UE sea -Sci fit bike X 6 min level 1 with full revolutions today -seated LAQ against resistance from other leg contracting 5 seconds and then stretching AAROM from other leg into knee flexion stretch 5 sec  X40, (contract-relax stretching) -Seated knee flexion stretch AAROM 5 sec X 10  -Manual therapy: Rt knee PROM with overpressure, grade 2-3 mobs for flexion and extension in both supine and sitting (she appears to have less muscle guarding in sitting) --Supine knee extension heel prop stretch X 5 min during first half of vaso -Vasopnuematic X 10 min medium compression 34 deg to Rt knee  07/17/2021 Seated LAQ & active knee flex with contralateral LE opposing motion 15 reps 2 sets. Heel slide with RLE on 8" ball with strap 10 reps PT assist including closed chain leg press / knee ext.  Progressed to RLE on 55cm ball with BLEs & strap assist 10 reps.  Supine hip flexor stretch RLE over edge with LLE hooklying position 30 sec 3 reps. Progressed to rt knee ext & flex AAROM with RLE over edge 10 reps.  PT attempted sidelying closed chain leg press but pt unable to tolerate.  Standing with RW support weight shifts onto RLE with 5 sec hold.   Pt ambulated  with RW with PT cueing wt shift on RLE in stance, step through pattern & knee flexion in swing initiating in terminal stance.   Manual therapy: Rt knee PROM with overpressure and contract-relax for flexion seated & extension long sit. Soft tissue mob with pt prone.   Supine with hip 90* / femur vertical and PT PROM knee flexion.  Modalities Vasopnuematic X 10 min high compression 34 deg to Rt knee with elevation  07/16/21 -seated LAQ X 20 -Seated knee extension heel prop stretch X 4 min with heel in another chair -Seated knee flexion stretch AAROM 5 sec X 10 -Supine hamstring stretch 3 X 30 sec with strap -Supine heelslides AAROM 5 sec X 13 reps -Nu step X 6 min LE/UE seat #7  -Manual therapy: Rt knee PROM with overpressure, grade 2-3 mobs for flexion and extension  -Vasopnuematic X 10 min medium compression 34 deg to Rt knee        HOME EXERCISE PROGRAM: Access Code: [email or web address] URL: [email or web address] Date: 06/19/2021 Prepared by: Elsie Ra  Exercises - Quad Setting and Stretching  - 2-4 x daily - 7 x weekly - 5-10 sets - 10 reps - prop 5-10 minutes & quad set5 seconds hold - Supine Heel Slide with Strap  - 2-3 x daily - 7 x weekly - 2-3  sets - 10 reps - 5 seconds hold - Seated Knee Flexion Stretch  - 2 x daily - 6 x weekly - 1-2 sets - 10 reps - 5 sec hold - Seated straight leg lifts  - 2-3 x daily - 7 x weekly - 2-3 sets - 10 reps - 5 seconds hold - Seated Hamstring Stretch with Strap  - 2-4 x daily - 7 x weekly - 1 sets - 3 reps - 20-30 seconds hold - Sit to Stand with Counter Support  - 2 x daily - 6 x weekly - 1-2 sets - 10 reps   ASSESSMENT:   CLINICAL IMPRESSION:  pt slowly tolerated increase in range & functional activity including weight on RLE in gait.  She still has anxiety & pain significantly limiting her knee activity tolerance.  She continues to benefit from skilled PT to improve her knee function.    OBJECTIVE IMPAIRMENTS Abnormal gait,  decreased activity tolerance, decreased balance, decreased knowledge of condition, decreased knowledge of use of DME, decreased mobility, difficulty walking, decreased ROM, decreased strength, increased edema, increased muscle spasms, impaired flexibility, postural dysfunction, and pain.    ACTIVITY LIMITATIONS community activity, driving, and personal recreation like Yoga & walking dogs .    PERSONAL FACTORS 1-2 comorbidities: see PMH  are also affecting patient's functional outcome.    REHAB POTENTIAL: Good   CLINICAL DECISION MAKING: Stable/uncomplicated   EVALUATION COMPLEXITY: Low     GOALS: Goals reviewed with patient? Yes   SHORT TERM GOALS: Target date: 06/22/2021   Patient independent and verbalizes compliance with initial HEP Baseline: SEE OBJECTIVE DATA Goal status: on going 05/29/2021   2.  Patient reports 50% improvement in right knee pain. Baseline: SEE OBJECTIVE DATA Goal status: on going 05/29/2021   3.  PROM right knee extension -3* to flexion 90* Baseline: SEE OBJECTIVE DATA Goal status: on going 05/29/2021   LONG TERM GOALS: Target date: 08/31/2021   Patient will improve FOTO score to 67% Baseline: SEE OBJECTIVE DATA Goal status: ongoing 07/04/2021   2.  Patient reports right knee pain </= 2/10 with standing & gait activities.  Baseline: SEE OBJECTIVE DATA Goal status: ongoing 07/04/2021   3.  Right Knee PROM 0* extension to 110* flexion Baseline: SEE OBJECTIVE DATA Goal status: ongoing 07/04/2021   4.  Right Knee AROM seated -3* extension to 100* flexion Baseline: SEE OBJECTIVE DATA Goal status: ongoing 07/04/2021   5.  Patient ambulates >500' community distances including negotiating ramps, curbs & stairs without device independently. Baseline: SEE OBJECTIVE DATA Goal status: ongoing 07/04/2021   6.  Patient reports ability to perform Yoga & walk her dogs. Baseline: SEE OBJECTIVE DATA Goal status: ongoing 07/04/2021   PLAN: PT FREQUENCY: 3x/week of  manipulation, 4x/wk following week, 2-3xwk for 6 weeks   PT DURATION: 8 additional weeks   PLANNED INTERVENTIONS: Therapeutic exercises, Therapeutic activity, Neuromuscular re-education, Balance training, Gait training, Patient/Family education, Joint mobilization, Stair training, Vestibular training, DME instructions, Electrical stimulation, Cryotherapy, Moist heat, scar mobilization, Taping, Vasopneumatic device, physical performance testing and Manual therapy   PLAN FOR NEXT SESSION:   measure range,  manual therapy & exercise to progress range, we may need to focus on extension day one day and flexion day the next as she loses one when we gain the other.  Standing balance to facilitate weight bearing.    Jamey Reas, PT, DPT 07/19/21 10:35 AM

## 2021-07-20 ENCOUNTER — Encounter: Payer: Self-pay | Admitting: Physical Therapy

## 2021-07-20 ENCOUNTER — Ambulatory Visit (INDEPENDENT_AMBULATORY_CARE_PROVIDER_SITE_OTHER): Payer: No Typology Code available for payment source | Admitting: Physical Therapy

## 2021-07-20 DIAGNOSIS — M25661 Stiffness of right knee, not elsewhere classified: Secondary | ICD-10-CM

## 2021-07-20 DIAGNOSIS — M25561 Pain in right knee: Secondary | ICD-10-CM | POA: Diagnosis not present

## 2021-07-20 DIAGNOSIS — R2689 Other abnormalities of gait and mobility: Secondary | ICD-10-CM

## 2021-07-20 DIAGNOSIS — R2681 Unsteadiness on feet: Secondary | ICD-10-CM

## 2021-07-20 DIAGNOSIS — M6281 Muscle weakness (generalized): Secondary | ICD-10-CM | POA: Diagnosis not present

## 2021-07-20 DIAGNOSIS — R600 Localized edema: Secondary | ICD-10-CM

## 2021-07-20 NOTE — Therapy (Signed)
OUTPATIENT PHYSICAL THERAPY TREATMENT NOTE   Patient Name: Cheryl Tate MRN: [id number] DOB:24-Apr-1970, 51 y.o., female Today's Date: 07/20/2021   END OF SESSION:   PT End of Session - 07/20/21 0938     Visit Number 23    Number of Visits 36    Date for PT Re-Evaluation 08/31/21    Authorization Type UHC    Authorization Time Period UHC CHOICE  NO VISIT LIMIT, PATIENT PAYS 100% UNTIL DED IS MET, DED $7500 [id number] MET, TERM DATE 09/19/2021    Progress Note Due on Visit 26    PT Start Time 0930    PT Stop Time 1018    PT Time Calculation (min) 48 min    Activity Tolerance Patient limited by pain    Behavior During Therapy WFL for tasks assessed/performed                         Past Medical History:  Diagnosis Date   Anemia    Depression    Menorrhagia    Mood disorder (HCC)    PONV (postoperative nausea and vomiting)    Von Willebrand's disease ([place])    followed by hemotologist Dr. Marin Olp   Past Surgical History:  Procedure Laterality Date   APPENDECTOMY  01/22/2004   DILATION AND CURETTAGE OF UTERUS  2002   x 2 in 2002   DILITATION & CURRETTAGE/HYSTROSCOPY WITH HYDROTHERMAL ABLATION N/A 11/22/2014   Procedure: HYSTEROSCOPY WITH HYDROTHERMAL ABLATION;  Surgeon: Dian Queen, MD;  Location: [place];  Service: Gynecology;  Laterality: N/A;   EXAM UNDER ANESTHESIA WITH MANIPULATION OF KNEE Right 07/12/2021   Procedure: RIGHT EXAM UNDER ANESTHESIA WITH MANIPULATION OF KNEE;  Surgeon: Meredith Pel, MD;  Location: [place];  Service: Orthopedics;  Laterality: Right;   HYSTEROSCOPY WITH D & C  08-08-2014  in [place]   MULTIPLE TOOTH EXTRACTIONS     TOTAL KNEE ARTHROPLASTY Right 05/22/2021   Procedure: RIGHT TOTAL KNEE ARTHROPLASTY;  Surgeon: Meredith Pel, MD;  Location: [place];  Service: Orthopedics;  Laterality: Right;   Patient Active Problem List   Diagnosis Date Noted   Arthritis of right knee    S/P knee replacement  05/22/2021   BMI 33.0-33.9,adult 06/16/2015   Von Willebrand disease ([place]) 06/13/2015   Depression 12/29/2014   Rhinitis, allergic 12/28/2014    PCP: Eunice Blase, MD   REFERRING PROVIDER: Donella Stade, PA-C   REFERRING DIAG: [phone] (ICD-10-CM) - History of total knee arthroplasty, unspecified laterality  ONSET DATE: 05/22/2021 Right TKA   THERAPY DIAG:  Stiffness of right knee, not elsewhere classified  Acute pain of right knee  Localized edema  Muscle weakness (generalized)  Other abnormalities of gait and mobility  Unsteadiness on feet  PERTINENT HISTORY: Von Willebrand disease, depression, Menorrhgia  PRECAUTIONS: None  SUBJECTIVE:  She started taking prescribed steroid. PAIN:  NPRS scale: 4/10 today upon arrival Pain location: right knee all over.  Pain description: sharp, sore, cramps Aggravating factors: fall Relieving factors: ice, meds   OBJECTIVE:    PATIENT SURVEYS:  05/28/2021 FOTO 29% functional & target 67%   COGNITION: 05/28/2021 Overall cognitive status: Within functional limits for tasks assessed                EDEMA: 05/28/2021 RLE above knee 51.8cm around knee 48cm below knee  41.2cm LLE above knee 45.8cm around knee 40cm below knee 34.1cm   PALPATION: 05/28/2021 Bruising along medial knee & lower  leg, tenderness over joint line, quads, hamstrings   LE ROM:   ROM P: Passive  A: Active Right 05/28/21 Right 05/31/21 Right 06/05/21 Right 06/06/21 Right 06/11/21 Right 06/14/21 Right 06/19/21 Right 06/26/21 Right 06/28/21 Right 07/02/21 Right 07/13/21 Right 07/19/21 07/20/21  Hip flexion                Hip extension                Hip abduction                Hip adduction                Hip internal rotation                Hip external rotation                Knee flexion Supine P: 64* Seated  A: 64* Supine AROM heel slide:61 PROM overpressure in heel slide : 66   Supine AA: 73* Seated  P: 75*  Seated P: 79* Seated P: 75 Seated P: 70  with muscle guarding limiting Seated P:78 after manual therapy Seated P: 75*  A: 64* after manual therapy Supine A 70 P 78 AA 84 (after exercise) Seated P:85 After maual therapy Seated P:93 after manual therapy  Knee extension Supine P: -10* Seated  A: -31* Seated AROM LAQ: -21 Seated LAQ A: -14* Supine P: -9* Supine A: -8 Supine P: -7* Supine A: -8 P:-6 Supine A:-8 P:-5  Seated P: -7* after manual therapy Seated  A -16 (seated LAQ)    Ankle dorsiflexion                Ankle plantarflexion                Ankle inversion                Ankle eversion                 (Blank rows = not tested)   LE MMT:   MMT Right 05/28/2021 Left 05/28/2021  Hip flexion      Hip extension      Hip abduction      Hip adduction      Hip internal rotation      Hip external rotation      Knee flexion 2/5   Knee extension 2/5   Ankle dorsiflexion      Ankle plantarflexion      Ankle inversion      Ankle eversion       (Blank rows = not tested)   GAIT: 05/28/2021 Distance walked: 71' Assistive device utilized:  4 footed walker level of assistance: SBA  verbal cues needed Comments: antalgic, right knee flexed in stance, limited to no increase flexion for swing, RLE abducted,    Functional Activities: Pt using LLE to move RLE in & out of bed.  PT min guard RLE and she was able to move RLE without assisting with LLE.    TODAY'S TREATMENT: 07/20/21 -Nu step X 9 min holding flexion and ext ROM 5 sec LE/UE -Weight shifting with bilat UE support at counter top  X15 lateral, X 15 A-P in mid gait stance (Rt foot back) and X 15 P-A in mid gait stance (Rt foot fwd) -Seated knee flexion AAROM stretch 10 sec  X20 -Seated heel prop in extra chair extension stretch with quad sets 5 sec X 20 -Supine heel prop for  extension first 5 min of vaso  -Manual therapy: Rt knee PROM with overpressure and mobs grade 3 to her tolerance.   -Modalaties: Vasopnuematic X 10 min medium compression 34 deg to Rt  knee  07/19/2021 Therapeutic Exercise: -Nu step X 6 min 5 sec hold flex & ext with LE/UE seat 5 -Sci fit bike seat 6 X 7 min level 1 and she was able to do full revolutions today -leg press BLEs 75# with focus on RLE ext. 10 reps 2 sets with PT manual assist end range ext and flex -seated heel slides with quad set ext / leg press with pt manual assist end range & flexion with end range assist LLE 5 sec hold 20 reps. Pt verbalized understanding for home.  PT recommended alternating exercises that emphasize flex & ext. Pt verbalized understanding.  Therapeutic Activities. Standing with RW feet even off back legs of RW: Weight shift right/left with 5 sec hold on RLE 10 reps 3 times during session upon arising.  Weight ant/post with pelvis midline 5 sec hold 10 reps. Gait with RW with cues on step through pattern. Pt performed with antalgic pattern but improved stance duration & knee ext during session.   -Manual therapy: Rt knee PROM with overpressure, grade 2-3 mobs for flexion and extension in both supine and sitting (she appears to have less muscle guarding in sitting)  Modalities: --Supine knee extension heel prop stretch X 5 min during first half of vaso       HOME EXERCISE PROGRAM: Access Code: [email or web address] URL: [email or web address] Date: 06/19/2021 Prepared by: Elsie Ra  Exercises - Quad Setting and Stretching  - 2-4 x daily - 7 x weekly - 5-10 sets - 10 reps - prop 5-10 minutes & quad set5 seconds hold - Supine Heel Slide with Strap  - 2-3 x daily - 7 x weekly - 2-3 sets - 10 reps - 5 seconds hold - Seated Knee Flexion Stretch  - 2 x daily - 6 x weekly - 1-2 sets - 10 reps - 5 sec hold - Seated straight leg lifts  - 2-3 x daily - 7 x weekly - 2-3 sets - 10 reps - 5 seconds hold - Seated Hamstring Stretch with Strap  - 2-4 x daily - 7 x weekly - 1 sets - 3 reps - 20-30 seconds hold - Sit to Stand with Counter Support  - 2 x daily - 6 x weekly - 1-2 sets - 10  reps   ASSESSMENT:   CLINICAL IMPRESSION: she was able to tolerate more weight shifting onto her Rt LE today but still overall painful with this. We continued to focus heavy on ROM S/P MUA. Knee flexion ROM is improving slowly but she also lacks more extension ROM and it makes it hard to gain both as she gets more stiff in one as we improve the other.   OBJECTIVE IMPAIRMENTS Abnormal gait, decreased activity tolerance, decreased balance, decreased knowledge of condition, decreased knowledge of use of DME, decreased mobility, difficulty walking, decreased ROM, decreased strength, increased edema, increased muscle spasms, impaired flexibility, postural dysfunction, and pain.    ACTIVITY LIMITATIONS community activity, driving, and personal recreation like Yoga & walking dogs .    PERSONAL FACTORS 1-2 comorbidities: see PMH  are also affecting patient's functional outcome.    REHAB POTENTIAL: Good   CLINICAL DECISION MAKING: Stable/uncomplicated   EVALUATION COMPLEXITY: Low     GOALS: Goals reviewed with patient? Yes   SHORT TERM GOALS: Target date: 06/22/2021  Patient independent and verbalizes compliance with initial HEP Baseline: SEE OBJECTIVE DATA Goal status: on going 05/29/2021   2.  Patient reports 50% improvement in right knee pain. Baseline: SEE OBJECTIVE DATA Goal status: on going 05/29/2021   3.  PROM right knee extension -3* to flexion 90* Baseline: SEE OBJECTIVE DATA Goal status: on going 05/29/2021   LONG TERM GOALS: Target date: 08/31/2021   Patient will improve FOTO score to 67% Baseline: SEE OBJECTIVE DATA Goal status: ongoing 07/04/2021   2.  Patient reports right knee pain </= 2/10 with standing & gait activities.  Baseline: SEE OBJECTIVE DATA Goal status: ongoing 07/04/2021   3.  Right Knee PROM 0* extension to 110* flexion Baseline: SEE OBJECTIVE DATA Goal status: ongoing 07/04/2021   4.  Right Knee AROM seated -3* extension to 100* flexion Baseline: SEE  OBJECTIVE DATA Goal status: ongoing 07/04/2021   5.  Patient ambulates >500' community distances including negotiating ramps, curbs & stairs without device independently. Baseline: SEE OBJECTIVE DATA Goal status: ongoing 07/04/2021   6.  Patient reports ability to perform Yoga & walk her dogs. Baseline: SEE OBJECTIVE DATA Goal status: ongoing 07/04/2021   PLAN: PT FREQUENCY: 3x/week of manipulation, 4x/wk following week, 2-3xwk for 6 weeks   PT DURATION: 8 additional weeks   PLANNED INTERVENTIONS: Therapeutic exercises, Therapeutic activity, Neuromuscular re-education, Balance training, Gait training, Patient/Family education, Joint mobilization, Stair training, Vestibular training, DME instructions, Electrical stimulation, Cryotherapy, Moist heat, scar mobilization, Taping, Vasopneumatic device, physical performance testing and Manual therapy   PLAN FOR NEXT SESSION:  manual therapy & exercise to progress range, we may need to focus on extension day one day and flexion day the next as she loses one when we gain the other.  Standing balance to facilitate weight bearing.    Elsie Ra, PT, DPT 07/20/21 9:52 AM

## 2021-07-23 ENCOUNTER — Encounter: Payer: No Typology Code available for payment source | Admitting: Physical Therapy

## 2021-07-23 ENCOUNTER — Ambulatory Visit (INDEPENDENT_AMBULATORY_CARE_PROVIDER_SITE_OTHER): Payer: No Typology Code available for payment source | Admitting: Physical Therapy

## 2021-07-23 ENCOUNTER — Encounter: Payer: Self-pay | Admitting: Physical Therapy

## 2021-07-23 DIAGNOSIS — M25561 Pain in right knee: Secondary | ICD-10-CM

## 2021-07-23 DIAGNOSIS — R600 Localized edema: Secondary | ICD-10-CM | POA: Diagnosis not present

## 2021-07-23 DIAGNOSIS — R2681 Unsteadiness on feet: Secondary | ICD-10-CM

## 2021-07-23 DIAGNOSIS — M6281 Muscle weakness (generalized): Secondary | ICD-10-CM | POA: Diagnosis not present

## 2021-07-23 DIAGNOSIS — R2689 Other abnormalities of gait and mobility: Secondary | ICD-10-CM

## 2021-07-23 DIAGNOSIS — M25661 Stiffness of right knee, not elsewhere classified: Secondary | ICD-10-CM | POA: Diagnosis not present

## 2021-07-23 NOTE — Therapy (Signed)
OUTPATIENT PHYSICAL THERAPY TREATMENT NOTE   Patient Name: Cheryl Tate MRN: [id number] DOB:[birth year], 51 y.o., female Today's Date: 07/23/2021   END OF SESSION:   PT End of Session - 07/23/21 0805     Visit Number 24    Number of Visits 36    Date for PT Re-Evaluation 08/31/21    Authorization Type UHC    Authorization Time Period UHC CHOICE  NO VISIT LIMIT, PATIENT PAYS 100% UNTIL DED IS MET, DED $7500 [id number] MET, TERM DATE 09/19/2021    Progress Note Due on Visit 26    PT Start Time 0800    PT Stop Time 0854    PT Time Calculation (min) 54 min    Activity Tolerance Patient limited by pain    Behavior During Therapy [facility] for tasks assessed/performed                          Past Medical History:  Diagnosis Date   Anemia    Depression    Menorrhagia    Mood disorder (HCC)    PONV (postoperative nausea and vomiting)    Von Willebrand's disease ([place])    followed by hemotologist Dr. Marin Olp   Past Surgical History:  Procedure Laterality Date   APPENDECTOMY  01/22/2004   DILATION AND CURETTAGE OF UTERUS  2002   x 2 in 2002   DILITATION & CURRETTAGE/HYSTROSCOPY WITH HYDROTHERMAL ABLATION N/A 11/22/2014   Procedure: HYSTEROSCOPY WITH HYDROTHERMAL ABLATION;  Surgeon: Dian Queen, MD;  Location: [place];  Service: Gynecology;  Laterality: N/A;   EXAM UNDER ANESTHESIA WITH MANIPULATION OF KNEE Right 07/12/2021   Procedure: RIGHT EXAM UNDER ANESTHESIA WITH MANIPULATION OF KNEE;  Surgeon: Meredith Pel, MD;  Location: [place];  Service: Orthopedics;  Laterality: Right;   HYSTEROSCOPY WITH D & C  08-08-2014  in [place]   MULTIPLE TOOTH EXTRACTIONS     TOTAL KNEE ARTHROPLASTY Right 05/22/2021   Procedure: RIGHT TOTAL KNEE ARTHROPLASTY;  Surgeon: Meredith Pel, MD;  Location: [place];  Service: Orthopedics;  Laterality: Right;   Patient Active Problem List   Diagnosis Date Noted   Arthritis of right knee    S/P knee replacement  05/22/2021   BMI 33.0-33.9,adult 06/16/2015   Von Willebrand disease ([place]) 06/13/2015   Depression 12/29/2014   Rhinitis, allergic 12/28/2014    PCP: Eunice Blase, MD   REFERRING PROVIDER: Donella Stade, PA-C   REFERRING DIAG: [phone] (ICD-10-CM) - History of total knee arthroplasty, unspecified laterality  ONSET DATE: 05/22/2021 Right TKA   THERAPY DIAG:  Stiffness of right knee, not elsewhere classified  Acute pain of right knee  Localized edema  Muscle weakness (generalized)  Other abnormalities of gait and mobility  Unsteadiness on feet  PERTINENT HISTORY: Von Willebrand disease, depression, Menorrhgia  PRECAUTIONS: None  SUBJECTIVE:  She went to gym this weekend.  Her bike that she purchased has not come in yet.  The steroids seem to be helping a little.   PAIN:  NPRS scale: 2-3/10 today upon arrival and over the weekend ranging 2/10 to 9/10 (more at night) Pain location: right knee all over.  Pain description: sharp, sore, cramps Aggravating factors: fall Relieving factors: ice, meds   OBJECTIVE:    PATIENT SURVEYS:  05/28/2021 FOTO 29% functional & target 67%   COGNITION: 05/28/2021 Overall cognitive status: Within functional limits for tasks assessed  EDEMA: 05/28/2021 RLE above knee 51.8cm around knee 48cm below knee  41.2cm LLE above knee 45.8cm around knee 40cm below knee 34.1cm   PALPATION: 05/28/2021 Bruising along medial knee & lower leg, tenderness over joint line, quads, hamstrings   LE ROM:   ROM P: Passive  A: Active Right 05/28/21 Right 05/31/21 Right 06/05/21 Right 06/06/21 Right 06/11/21 Right 06/14/21 Right 06/19/21 Right 06/26/21 Right 06/28/21 Right 07/02/21 Right 07/13/21 Right 07/19/21 Right 07/20/21 Right 07/23/2021  Hip flexion                 Hip extension                 Hip abduction                 Hip adduction                 Hip internal rotation                 Hip external rotation                 Knee  flexion Supine P: 64* Seated  A: 64* Supine AROM heel slide:61 PROM overpressure in heel slide : 66   Supine AA: 73* Seated  P: 75*  Seated P: 79* Seated P: 75 Seated P: 70 with muscle guarding limiting Seated P:78 after manual therapy Seated P: 75*  A: 64* after manual therapy Supine A 70 P 78 AA 84 (after exercise) Seated P:85 After maual therapy Seated P:93 after manual therapy Seated P: 97*  Knee extension Supine P: -10* Seated  A: -31* Seated AROM LAQ: -21 Seated LAQ A: -14* Supine P: -9* Supine A: -8 Supine P: -7* Supine A: -8 P:-6 Supine A:-8 P:-5  Seated P: -7* after manual therapy Seated  A -16 (seated LAQ)   Supine P: -9*  Ankle dorsiflexion                 Ankle plantarflexion                 Ankle inversion                 Ankle eversion                  (Blank rows = not tested)   LE MMT:   MMT Right 05/28/2021 Left 05/28/2021  Hip flexion      Hip extension      Hip abduction      Hip adduction      Hip internal rotation      Hip external rotation      Knee flexion 2/5   Knee extension 2/5   Ankle dorsiflexion      Ankle plantarflexion      Ankle inversion      Ankle eversion       (Blank rows = not tested)   GAIT: 05/28/2021 Distance walked: 42' Assistive device utilized:  4 footed walker level of assistance: SBA  verbal cues needed Comments: antalgic, right knee flexed in stance, limited to no increase flexion for swing, RLE abducted,    Functional Activities: Pt using LLE to move RLE in & out of bed.  PT min guard RLE and she was able to move RLE without assisting with LLE.    TODAY'S TREATMENT: 07/23/2021 -Nu step seat 5 X 8 min holding flexion and ext ROM 5 sec LE/UE -seated LAQ & active knee flexion with  contralateral LE opposing motion 10 reps 3 sets. -prone knee flexion 10 reps and active ext / quad set 10 reps. Progressed to knee flexion then internal rotation of hip to external rotation of hip to knee ext 10 reps.  Quad sets  supine 10 reps sit to/from stand with PT manual assist at pelvis to engage RLE and Standing terminal knee ext with red theraband progressing to include RLE SLS lifting left heel then to lifting LLE off floor (required RW support) 15 reps total.   -Manual therapy: Rt knee PROM with overpressure seated, prone & supine with mobs grade 3 to her tolerance.  Soft tissue mobs to medial & posterior knee.   -Modalaties: Vasopnuematic X 10 min medium compression 34 deg to Rt knee with knee positioned for ext stretch 5 min.   07/20/21 -Nu step X 9 min holding flexion and ext ROM 5 sec LE/UE -Weight shifting with bilat UE support at counter top  X15 lateral, X 15 A-P in mid gait stance (Rt foot back) and X 15 P-A in mid gait stance (Rt foot fwd) -Seated knee flexion AAROM stretch 10 sec  X20 -Seated heel prop in extra chair extension stretch with quad sets 5 sec X 20 -Supine heel prop for extension first 5 min of vaso  -Manual therapy: Rt knee PROM with overpressure and mobs grade 3 to her tolerance.   -Modalaties: Vasopnuematic X 10 min medium compression 34 deg to Rt knee  07/19/2021 Therapeutic Exercise: -Nu step X 6 min 5 sec hold flex & ext with LE/UE seat 5 -Sci fit bike seat 6 X 7 min level 1 and she was able to do full revolutions today -leg press BLEs 75# with focus on RLE ext. 10 reps 2 sets with PT manual assist end range ext and flex -seated heel slides with quad set ext / leg press with pt manual assist end range & flexion with end range assist LLE 5 sec hold 20 reps. Pt verbalized understanding for home.  PT recommended alternating exercises that emphasize flex & ext. Pt verbalized understanding.  Therapeutic Activities. Standing with RW feet even off back legs of RW: Weight shift right/left with 5 sec hold on RLE 10 reps 3 times during session upon arising.  Weight ant/post with pelvis midline 5 sec hold 10 reps. Gait with RW with cues on step through pattern. Pt performed with  antalgic pattern but improved stance duration & knee ext during session.   -Manual therapy: Rt knee PROM with overpressure, grade 2-3 mobs for flexion and extension in both supine and sitting (she appears to have less muscle guarding in sitting)  Modalities: --Supine knee extension heel prop stretch X 5 min during first half of vaso       HOME EXERCISE PROGRAM: Access Code: [email or web address] URL: [email or web address] Date: 06/19/2021 Prepared by: Elsie Ra  Exercises - Quad Setting and Stretching  - 2-4 x daily - 7 x weekly - 5-10 sets - 10 reps - prop 5-10 minutes & quad set5 seconds hold - Supine Heel Slide with Strap  - 2-3 x daily - 7 x weekly - 2-3 sets - 10 reps - 5 seconds hold - Seated Knee Flexion Stretch  - 2 x daily - 6 x weekly - 1-2 sets - 10 reps - 5 sec hold - Seated straight leg lifts  - 2-3 x daily - 7 x weekly - 2-3 sets - 10 reps - 5 seconds hold - Seated Hamstring Stretch with Strap  - 2-4  x daily - 7 x weekly - 1 sets - 3 reps - 20-30 seconds hold - Sit to Stand with Counter Support  - 2 x daily - 6 x weekly - 1-2 sets - 10 reps   ASSESSMENT:   CLINICAL IMPRESSION: PT activities worked on both flexion & extension with ability to transition between both.  She improved with repetition & PT instructions including manual assist. She continues to be limited by pain & anxiety.    OBJECTIVE IMPAIRMENTS Abnormal gait, decreased activity tolerance, decreased balance, decreased knowledge of condition, decreased knowledge of use of DME, decreased mobility, difficulty walking, decreased ROM, decreased strength, increased edema, increased muscle spasms, impaired flexibility, postural dysfunction, and pain.    ACTIVITY LIMITATIONS community activity, driving, and personal recreation like Yoga & walking dogs .    PERSONAL FACTORS 1-2 comorbidities: see PMH  are also affecting patient's functional outcome.    REHAB POTENTIAL: Good   CLINICAL DECISION MAKING:  Stable/uncomplicated   EVALUATION COMPLEXITY: Low     GOALS: Goals reviewed with patient? Yes   SHORT TERM GOALS: Target date: 06/22/2021   Patient independent and verbalizes compliance with initial HEP Baseline: SEE OBJECTIVE DATA Goal status: on going 05/29/2021   2.  Patient reports 50% improvement in right knee pain. Baseline: SEE OBJECTIVE DATA Goal status: on going 05/29/2021   3.  PROM right knee extension -3* to flexion 90* Baseline: SEE OBJECTIVE DATA Goal status: on going 05/29/2021   LONG TERM GOALS: Target date: 08/31/2021   Patient will improve FOTO score to 67% Baseline: SEE OBJECTIVE DATA Goal status: ongoing 07/04/2021   2.  Patient reports right knee pain </= 2/10 with standing & gait activities.  Baseline: SEE OBJECTIVE DATA Goal status: ongoing 07/04/2021   3.  Right Knee PROM 0* extension to 110* flexion Baseline: SEE OBJECTIVE DATA Goal status: ongoing 07/04/2021   4.  Right Knee AROM seated -3* extension to 100* flexion Baseline: SEE OBJECTIVE DATA Goal status: ongoing 07/04/2021   5.  Patient ambulates >500' community distances including negotiating ramps, curbs & stairs without device independently. Baseline: SEE OBJECTIVE DATA Goal status: ongoing 07/04/2021   6.  Patient reports ability to perform Yoga & walk her dogs. Baseline: SEE OBJECTIVE DATA Goal status: ongoing 07/04/2021   PLAN: PT FREQUENCY: 3x/week of manipulation, 4x/wk following week, 2-3xwk for 6 weeks   PT DURATION: 8 additional weeks   PLANNED INTERVENTIONS: Therapeutic exercises, Therapeutic activity, Neuromuscular re-education, Balance training, Gait training, Patient/Family education, Joint mobilization, Stair training, Vestibular training, DME instructions, Electrical stimulation, Cryotherapy, Moist heat, scar mobilization, Taping, Vasopneumatic device, physical performance testing and Manual therapy   PLAN FOR NEXT SESSION:  continue manual therapy & exercise to progress range,  exercises that alternated between flexion & extension, Standing balance to facilitate weight bearing.    Jamey Reas, PT, DPT 07/23/21 8:54 AM

## 2021-07-25 ENCOUNTER — Encounter: Payer: Self-pay | Admitting: Physical Therapy

## 2021-07-25 ENCOUNTER — Encounter: Payer: No Typology Code available for payment source | Admitting: Physical Therapy

## 2021-07-25 ENCOUNTER — Ambulatory Visit (INDEPENDENT_AMBULATORY_CARE_PROVIDER_SITE_OTHER): Payer: No Typology Code available for payment source | Admitting: Physical Therapy

## 2021-07-25 DIAGNOSIS — M25661 Stiffness of right knee, not elsewhere classified: Secondary | ICD-10-CM

## 2021-07-25 DIAGNOSIS — M25561 Pain in right knee: Secondary | ICD-10-CM

## 2021-07-25 DIAGNOSIS — M6281 Muscle weakness (generalized): Secondary | ICD-10-CM | POA: Diagnosis not present

## 2021-07-25 DIAGNOSIS — R600 Localized edema: Secondary | ICD-10-CM

## 2021-07-25 DIAGNOSIS — R2689 Other abnormalities of gait and mobility: Secondary | ICD-10-CM

## 2021-07-25 DIAGNOSIS — R2681 Unsteadiness on feet: Secondary | ICD-10-CM

## 2021-07-25 NOTE — Therapy (Signed)
OUTPATIENT PHYSICAL THERAPY TREATMENT NOTE   Patient Name: Cheryl Tate MRN: [id number] DOB:1970/04/19, 51 y.o., female Today's Date: 07/25/2021   END OF SESSION:   PT End of Session - 07/25/21 0805     Visit Number 25    Number of Visits 36    Date for PT Re-Evaluation 08/31/21    Authorization Type UHC    Authorization Time Period [facility] CHOICE  NO VISIT LIMIT, PATIENT PAYS 100% UNTIL DED IS MET, DED $7500 [id number] MET, TERM DATE 09/19/2021    Progress Note Due on Visit 26    PT Start Time 0801    PT Stop Time 0855    PT Time Calculation (min) 54 min    Activity Tolerance Patient limited by pain    Behavior During Therapy [facility] for tasks assessed/performed                           Past Medical History:  Diagnosis Date   Anemia    Depression    Menorrhagia    Mood disorder (HCC)    PONV (postoperative nausea and vomiting)    Von Willebrand's disease ([place])    followed by hemotologist Dr. Marin Olp   Past Surgical History:  Procedure Laterality Date   APPENDECTOMY  01/22/2004   DILATION AND CURETTAGE OF UTERUS  2002   x 2 in 2002   DILITATION & CURRETTAGE/HYSTROSCOPY WITH HYDROTHERMAL ABLATION N/A 11/22/2014   Procedure: HYSTEROSCOPY WITH HYDROTHERMAL ABLATION;  Surgeon: Dian Queen, MD;  Location: [place];  Service: Gynecology;  Laterality: N/A;   EXAM UNDER ANESTHESIA WITH MANIPULATION OF KNEE Right 07/12/2021   Procedure: RIGHT EXAM UNDER ANESTHESIA WITH MANIPULATION OF KNEE;  Surgeon: Meredith Pel, MD;  Location: [place];  Service: Orthopedics;  Laterality: Right;   HYSTEROSCOPY WITH D & C  08-08-2014  in [place]   MULTIPLE TOOTH EXTRACTIONS     TOTAL KNEE ARTHROPLASTY Right 05/22/2021   Procedure: RIGHT TOTAL KNEE ARTHROPLASTY;  Surgeon: Meredith Pel, MD;  Location: [place];  Service: Orthopedics;  Laterality: Right;   Patient Active Problem List   Diagnosis Date Noted   Arthritis of right knee    S/P knee replacement  05/22/2021   BMI 33.0-33.9,adult 06/16/2015   Von Willebrand disease ([place]) 06/13/2015   Depression 12/29/2014   Rhinitis, allergic 12/28/2014    PCP: Eunice Blase, MD   REFERRING PROVIDER: Donella Stade, PA-C   REFERRING DIAG: [phone] (ICD-10-CM) - History of total knee arthroplasty, unspecified laterality  ONSET DATE: 05/22/2021 Right TKA   THERAPY DIAG:  Stiffness of right knee, not elsewhere classified  Acute pain of right knee  Localized edema  Muscle weakness (generalized)  Other abnormalities of gait and mobility  Unsteadiness on feet  PERTINENT HISTORY: Von Willebrand disease, depression, Menorrhgia  PRECAUTIONS: None  SUBJECTIVE:  She has decreased Oxy to 1x at night and also needed less Tylenol.  She worked on walking with weight bearing on RLE as PT advised last session.   PAIN:  NPRS scale:  1/10 today upon arrival and since last PT ranging 0/10 to 7-8/10 (more at night) Pain location: right knee all over.  Pain description: sharp, sore, cramps Aggravating factors: fall Relieving factors: ice, meds   OBJECTIVE:    PATIENT SURVEYS:  05/28/2021 FOTO 29% functional & target 67%   COGNITION: 05/28/2021 Overall cognitive status: Within functional limits for tasks assessed  EDEMA: 05/28/2021 RLE above knee 51.8cm around knee 48cm below knee  41.2cm LLE above knee 45.8cm around knee 40cm below knee 34.1cm   PALPATION: 05/28/2021 Bruising along medial knee & lower leg, tenderness over joint line, quads, hamstrings   LE ROM:   ROM P: Passive  A: Active Right 05/28/21 Right 05/31/21 Right 06/05/21 Right 06/06/21 Right 06/11/21 Right 06/14/21 Right 06/19/21 Right 06/26/21 Right 06/28/21 Right 07/02/21 Right 07/13/21 Right 07/19/21 Right 07/20/21 Right 07/23/2021  Hip flexion                 Hip extension                 Hip abduction                 Hip adduction                 Hip internal rotation                 Hip external rotation                  Knee flexion Supine P: 64* Seated  A: 64* Supine AROM heel slide:61 PROM overpressure in heel slide : 66   Supine AA: 73* Seated  P: 75*  Seated P: 79* Seated P: 75 Seated P: 70 with muscle guarding limiting Seated P:78 after manual therapy Seated P: 75*  A: 64* after manual therapy Supine A 70 P 78 AA 84 (after exercise) Seated P:85 After maual therapy Seated P:93 after manual therapy Seated P: 97*  Knee extension Supine P: -10* Seated  A: -31* Seated AROM LAQ: -21 Seated LAQ A: -14* Supine P: -9* Supine A: -8 Supine P: -7* Supine A: -8 P:-6 Supine A:-8 P:-5  Seated P: -7* after manual therapy Seated  A -16 (seated LAQ)   Supine P: -9*  Ankle dorsiflexion                 Ankle plantarflexion                 Ankle inversion                 Ankle eversion                  (Blank rows = not tested)   LE MMT:   MMT Right 05/28/2021 Left 05/28/2021  Hip flexion      Hip extension      Hip abduction      Hip adduction      Hip internal rotation      Hip external rotation      Knee flexion 2/5   Knee extension 2/5   Ankle dorsiflexion      Ankle plantarflexion      Ankle inversion      Ankle eversion       (Blank rows = not tested)   GAIT: 05/28/2021 Distance walked: 37' Assistive device utilized:  4 footed walker level of assistance: SBA  verbal cues needed Comments: antalgic, right knee flexed in stance, limited to no increase flexion for swing, RLE abducted,    Functional Activities: Pt using LLE to move RLE in & out of bed.  PT min guard RLE and she was able to move RLE without assisting with LLE.    TODAY'S TREATMENT: 07/25/2021 Therapeutic Exercise -SciFit recumbent bike seat 6 rocking for stretch first 2 min, then full revolutions for 4 min using BLEs/BUEs, then  28mn level 1 with LEs only.  Total of 8 min work.  -seated heel slides on pillow case 5 sec hold for flex & ext with end range self-assist for 4 min -prone knee flexion 10 reps and  active ext / quad set 10 reps. Progressed to knee flexion then internal rotation of hip to external rotation of hip to knee ext 10 reps.   Therapeutic Activities -Sit to/from stand with PT manual assist at pelvis to engage RLE and Standing terminal knee ext with red theraband (x5), progressing to include RLE SLS lifting left heel (x5), then to lifting LLE off floor (x5) with required LUE cane support and tactile facilitation at quad - 15 reps total.  Progressed gait from RW to cane with supervision for 50'  -Manual therapy: Rt knee PROM with overpressure seated with mobs grade 3 to her tolerance.  Soft tissue mobs to medial & posterior knee.   -Modalaties: Vasopnuematic X 10 min medium compression 34 deg to Rt knee with knee positioned for ext stretch 10 min.   07/23/2021 -Nu step seat 5 X 8 min holding flexion and ext ROM 5 sec LE/UE -seated LAQ & active knee flexion with contralateral LE opposing motion 10 reps 3 sets. -prone knee flexion 10 reps and active ext / quad set 10 reps. Progressed to knee flexion then internal rotation of hip to external rotation of hip to knee ext 10 reps.  Quad sets supine 10 reps sit to/from stand with PT manual assist at pelvis to engage RLE and Standing terminal knee ext with red theraband progressing to include RLE SLS lifting left heel then to lifting LLE off floor (required RW support) 15 reps total.   -Manual therapy: Rt knee PROM with overpressure seated, prone & supine with mobs grade 3 to her tolerance.  Soft tissue mobs to medial & posterior knee.   -Modalaties: Vasopnuematic X 10 min medium compression 34 deg to Rt knee with knee positioned for ext stretch 5 min.   07/20/21 -Nu step X 9 min holding flexion and ext ROM 5 sec LE/UE -Weight shifting with bilat UE support at counter top  X15 lateral, X 15 A-P in mid gait stance (Rt foot back) and X 15 P-A in mid gait stance (Rt foot fwd) -Seated knee flexion AAROM stretch 10 sec  X20 -Seated heel prop  in extra chair extension stretch with quad sets 5 sec X 20 -Supine heel prop for extension first 5 min of vaso  -Manual therapy: Rt knee PROM with overpressure and mobs grade 3 to her tolerance.   -Modalaties: Vasopnuematic X 10 min medium compression 34 deg to Rt knee       HOME EXERCISE PROGRAM: Access Code: [email or web address]: [email or web address] Date: 06/19/2021 Prepared by: BElsie Ra Exercises - Quad Setting and Stretching  - 2-4 x daily - 7 x weekly - 5-10 sets - 10 reps - prop 5-10 minutes & quad set5 seconds hold - Supine Heel Slide with Strap  - 2-3 x daily - 7 x weekly - 2-3 sets - 10 reps - 5 seconds hold - Seated Knee Flexion Stretch  - 2 x daily - 6 x weekly - 1-2 sets - 10 reps - 5 sec hold - Seated straight leg lifts  - 2-3 x daily - 7 x weekly - 2-3 sets - 10 reps - 5 seconds hold - Seated Hamstring Stretch with Strap  - 2-4 x daily - 7 x weekly - 1 sets - 3 reps -  20-30 seconds hold - Sit to Stand with Counter Support  - 2 x daily - 6 x weekly - 1-2 sets - 10 reps   ASSESSMENT:   CLINICAL IMPRESSION:  PT continued activities worked on both flexion & extension with ability to transition between both and she improved ability to transition between motions. She progressed with assistive device use in clinic and at home and reported improved pain tolerance with exercises. She improved with repetition & PT instructions including manual assist. She continues to be limited by pain & anxiety.    OBJECTIVE IMPAIRMENTS Abnormal gait, decreased activity tolerance, decreased balance, decreased knowledge of condition, decreased knowledge of use of DME, decreased mobility, difficulty walking, decreased ROM, decreased strength, increased edema, increased muscle spasms, impaired flexibility, postural dysfunction, and pain.    ACTIVITY LIMITATIONS community activity, driving, and personal recreation like Yoga & walking dogs .    PERSONAL FACTORS 1-2 comorbidities: see  PMH  are also affecting patient's functional outcome.    REHAB POTENTIAL: Good   CLINICAL DECISION MAKING: Stable/uncomplicated   EVALUATION COMPLEXITY: Low     GOALS: Goals reviewed with patient? Yes   SHORT TERM GOALS: Target date: 06/22/2021   Patient independent and verbalizes compliance with initial HEP Baseline: SEE OBJECTIVE DATA Goal status: on going 05/29/2021   2.  Patient reports 50% improvement in right knee pain. Baseline: SEE OBJECTIVE DATA Goal status: on going 05/29/2021   3.  PROM right knee extension -3* to flexion 90* Baseline: SEE OBJECTIVE DATA Goal status: on going 05/29/2021   LONG TERM GOALS: Target date: 08/31/2021   Patient will improve FOTO score to 67% Baseline: SEE OBJECTIVE DATA Goal status: ongoing 07/04/2021   2.  Patient reports right knee pain </= 2/10 with standing & gait activities.  Baseline: SEE OBJECTIVE DATA Goal status: ongoing 07/04/2021   3.  Right Knee PROM 0* extension to 110* flexion Baseline: SEE OBJECTIVE DATA Goal status: ongoing 07/04/2021   4.  Right Knee AROM seated -3* extension to 100* flexion Baseline: SEE OBJECTIVE DATA Goal status: ongoing 07/04/2021   5.  Patient ambulates >500' community distances including negotiating ramps, curbs & stairs without device independently. Baseline: SEE OBJECTIVE DATA Goal status: ongoing 07/04/2021   6.  Patient reports ability to perform Yoga & walk her dogs. Baseline: SEE OBJECTIVE DATA Goal status: ongoing 07/04/2021   PLAN: PT FREQUENCY: 3x/week of manipulation, 4x/wk following week, 2-3xwk for 6 weeks   PT DURATION: 8 additional weeks   PLANNED INTERVENTIONS: Therapeutic exercises, Therapeutic activity, Neuromuscular re-education, Balance training, Gait training, Patient/Family education, Joint mobilization, Stair training, Vestibular training, DME instructions, Electrical stimulation, Cryotherapy, Moist heat, scar mobilization, Taping, Vasopneumatic device, physical performance  testing and Manual therapy   PLAN FOR NEXT SESSION:  continue manual therapy & exercise to progress range, exercises that alternated between flexion & extension, Standing balance to facilitate weight bearing with emphasis on stance ext. Take range measurements  Jana Hakim, SPT 07/25/2021, 9:39 AM  This entire session of physical therapy was performed under the direct supervision of PT signing evaluation /treatment. PT reviewed note and agrees.   Jamey Reas, PT, DPT 07/25/21 9:39 AM

## 2021-07-26 ENCOUNTER — Encounter: Payer: Self-pay | Admitting: Rehabilitative and Restorative Service Providers"

## 2021-07-26 ENCOUNTER — Ambulatory Visit (INDEPENDENT_AMBULATORY_CARE_PROVIDER_SITE_OTHER): Payer: No Typology Code available for payment source | Admitting: Rehabilitative and Restorative Service Providers"

## 2021-07-26 DIAGNOSIS — M25661 Stiffness of right knee, not elsewhere classified: Secondary | ICD-10-CM | POA: Diagnosis not present

## 2021-07-26 DIAGNOSIS — M6281 Muscle weakness (generalized): Secondary | ICD-10-CM | POA: Diagnosis not present

## 2021-07-26 DIAGNOSIS — R600 Localized edema: Secondary | ICD-10-CM

## 2021-07-26 DIAGNOSIS — R2689 Other abnormalities of gait and mobility: Secondary | ICD-10-CM

## 2021-07-26 DIAGNOSIS — M25561 Pain in right knee: Secondary | ICD-10-CM

## 2021-07-26 DIAGNOSIS — R2681 Unsteadiness on feet: Secondary | ICD-10-CM

## 2021-07-26 NOTE — Therapy (Signed)
OUTPATIENT PHYSICAL THERAPY TREATMENT NOTE   Patient Name: Cheryl Tate MRN: [id number] DOB:1970/11/21, 50 y.o., female Today's Date: 07/26/2021   END OF SESSION:   PT End of Session - 07/26/21 1346     Visit Number 26    Number of Visits 36    Date for PT Re-Evaluation 08/31/21    Authorization Type UHC    Authorization Time Period [employer] CHOICE  NO VISIT LIMIT, PATIENT PAYS 100% UNTIL DED IS MET, DED $7500 [id number] MET, TERM DATE 09/19/2021    Progress Note Due on Visit 26    PT Start Time 1303    PT Stop Time 1356    PT Time Calculation (min) 53 min    Activity Tolerance Patient limited by pain    Behavior During Therapy WFL for tasks assessed/performed              Past Medical History:  Diagnosis Date   Anemia    Depression    Menorrhagia    Mood disorder (HCC)    PONV (postoperative nausea and vomiting)    Von Willebrand's disease ([place])    followed by hemotologist Dr. Marin Olp   Past Surgical History:  Procedure Laterality Date   APPENDECTOMY  01/22/2004   DILATION AND CURETTAGE OF UTERUS  2002   x 2 in 2002   DILITATION & CURRETTAGE/HYSTROSCOPY WITH HYDROTHERMAL ABLATION N/A 11/22/2014   Procedure: HYSTEROSCOPY WITH HYDROTHERMAL ABLATION;  Surgeon: Dian Queen, MD;  Location: [place];  Service: Gynecology;  Laterality: N/A;   EXAM UNDER ANESTHESIA WITH MANIPULATION OF KNEE Right 07/12/2021   Procedure: RIGHT EXAM UNDER ANESTHESIA WITH MANIPULATION OF KNEE;  Surgeon: Meredith Pel, MD;  Location: [place];  Service: Orthopedics;  Laterality: Right;   HYSTEROSCOPY WITH D & C  08-08-2014  in [place]   MULTIPLE TOOTH EXTRACTIONS     TOTAL KNEE ARTHROPLASTY Right 05/22/2021   Procedure: RIGHT TOTAL KNEE ARTHROPLASTY;  Surgeon: Meredith Pel, MD;  Location: [place];  Service: Orthopedics;  Laterality: Right;   Patient Active Problem List   Diagnosis Date Noted   Arthritis of right knee    S/P knee replacement 05/22/2021   BMI  33.0-33.9,adult 06/16/2015   Von Willebrand disease ([place]) 06/13/2015   Depression 12/29/2014   Rhinitis, allergic 12/28/2014    PCP: Eunice Blase, MD   REFERRING PROVIDER: Donella Stade, PA-C   REFERRING DIAG: [phone] (ICD-10-CM) - History of total knee arthroplasty, unspecified laterality  ONSET DATE: 05/22/2021 Right TKA   THERAPY DIAG:  Stiffness of right knee, not elsewhere classified  Acute pain of right knee  Localized edema  Muscle weakness (generalized)  Other abnormalities of gait and mobility  Unsteadiness on feet  PERTINENT HISTORY: Von Willebrand disease, depression, Menorrhgia  PRECAUTIONS: None  SUBJECTIVE:  Oxycodone 1x at night and Tylenol PRN.  She reports good HEP compliance but is concerned about her progress post-manipulation.  PAIN:  NPRS scale:  0-6/10 (mostly at night) Pain location: right knee all over.  Pain description: sharp, sore, cramps Aggravating factors: fall Relieving factors: ice, meds   OBJECTIVE:    PATIENT SURVEYS:  05/28/2021 FOTO 29% functional & target 67%   COGNITION: 05/28/2021 Overall cognitive status: Within functional limits for tasks assessed                EDEMA: 05/28/2021 RLE above knee 51.8cm around knee 48cm below knee  41.2cm LLE above knee 45.8cm around knee 40cm below knee 34.1cm   PALPATION: 05/28/2021  Bruising along medial knee & lower leg, tenderness over joint line, quads, hamstrings   LE ROM:   ROM P: Passive  A: Active Right 05/28/21 Right 05/31/21 Right 06/05/21 Right 06/06/21 Right 06/11/21 Right 06/14/21 Right 06/19/21 Right 06/26/21 Right 06/28/21 Right 07/02/21 Right 07/13/21 Right 07/19/21 Right 07/20/21 Right 07/23/2021  Hip flexion                 Hip extension                 Hip abduction                 Hip adduction                 Hip internal rotation                 Hip external rotation                 Knee flexion Supine P: 64* Seated  A: 64* Supine AROM heel slide:61 PROM  overpressure in heel slide : 66   Supine AA: 73* Seated  P: 75*  Seated P: 79* Seated P: 75 Seated P: 70 with muscle guarding limiting Seated P:78 after manual therapy Seated P: 75*  A: 64* after manual therapy Supine A 70 P 78 AA 84 (after exercise) Seated P:85 After maual therapy Seated P:93 after manual therapy Seated P: 97*  Knee extension Supine P: -10* Seated  A: -31* Seated AROM LAQ: -21 Seated LAQ A: -14* Supine P: -9* Supine A: -8 Supine P: -7* Supine A: -8 P:-6 Supine A:-8 P:-5  Seated P: -7* after manual therapy Seated  A -16 (seated LAQ)   Supine P: -9*  Ankle dorsiflexion                 Ankle plantarflexion                 Ankle inversion                 Ankle eversion                  (Blank rows = not tested)   LE MMT:   MMT Right 05/28/2021 Left 05/28/2021  Hip flexion      Hip extension      Hip abduction      Hip adduction      Hip internal rotation      Hip external rotation      Knee flexion 2/5   Knee extension 2/5   Ankle dorsiflexion      Ankle plantarflexion      Ankle inversion      Ankle eversion       (Blank rows = not tested)   GAIT: 05/28/2021 Distance walked: 12' Assistive device utilized:  4 footed walker level of assistance: SBA  verbal cues needed Comments: antalgic, right knee flexed in stance, limited to no increase flexion for swing, RLE abducted,    Functional Activities: Pt using LLE to move RLE in & out of bed.  PT min guard RLE and she was able to move RLE without assisting with LLE.    TODAY'S TREATMENT: 07/26/2021 Recumbent bike Seat 5 AAROM for 5 minutes [place] knee flexion 1 minute Seated knee flexion AAROM (L pushes R into flexion) 10X 10 seconds Prone knee extension stretch 1.5# for 3 minutes Quadriceps sets with heel prop 2 sets of 10 for 5 seconds  Functional Activities: Leg  Press DL 20X 100# with push into extension and stretch into flexion 5 seconds each & 10X 37# R leg only Step-up and stretch  into flexion and extension 10X 5 seconds each (flexion and extension)  Vaso R knee 10 minutes Medium Pressure 34* post-treatment   07/25/2021 Therapeutic Exercise -SciFit recumbent bike seat 6 rocking for stretch first 2 min, then full revolutions for 4 min using BLEs/BUEs, then 36mn level 1 with LEs only.  Total of 8 min work.  -seated heel slides on pillow case 5 sec hold for flex & ext with end range self-assist for 4 min -prone knee flexion 10 reps and active ext / quad set 10 reps. Progressed to knee flexion then internal rotation of hip to external rotation of hip to knee ext 10 reps.   Therapeutic Activities -Sit to/from stand with PT manual assist at pelvis to engage RLE and Standing terminal knee ext with red theraband (x5), progressing to include RLE SLS lifting left heel (x5), then to lifting LLE off floor (x5) with required LUE cane support and tactile facilitation at quad - 15 reps total.  Progressed gait from RW to cane with supervision for 50'  -Manual therapy: Rt knee PROM with overpressure seated with mobs grade 3 to her tolerance.  Soft tissue mobs to medial & posterior knee.   -Modalaties: Vasopnuematic X 10 min medium compression 34 deg to Rt knee with knee positioned for ext stretch 10 min.    07/23/2021 -Nu step seat 5 X 8 min holding flexion and ext ROM 5 sec LE/UE -seated LAQ & active knee flexion with contralateral LE opposing motion 10 reps 3 sets. -prone knee flexion 10 reps and active ext / quad set 10 reps. Progressed to knee flexion then internal rotation of hip to external rotation of hip to knee ext 10 reps.  Quad sets supine 10 reps sit to/from stand with PT manual assist at pelvis to engage RLE and Standing terminal knee ext with red theraband progressing to include RLE SLS lifting left heel then to lifting LLE off floor (required RW support) 15 reps total.   -Manual therapy: Rt knee PROM with overpressure seated, prone & supine with mobs grade 3 to her  tolerance.  Soft tissue mobs to medial & posterior knee.   -Modalaties: Vasopnuematic X 10 min medium compression 34 deg to Rt knee with knee positioned for ext stretch 5 min.     HOME EXERCISE PROGRAM: Access Code: [email or web address]: [email or web address] Date: 06/19/2021 Prepared by: BElsie Ra Exercises - Quad Setting and Stretching  - 2-4 x daily - 7 x weekly - 5-10 sets - 10 reps - prop 5-10 minutes & quad set5 seconds hold - Supine Heel Slide with Strap  - 2-3 x daily - 7 x weekly - 2-3 sets - 10 reps - 5 seconds hold - Seated Knee Flexion Stretch  - 2 x daily - 6 x weekly - 1-2 sets - 10 reps - 5 sec hold - Seated straight leg lifts  - 2-3 x daily - 7 x weekly - 2-3 sets - 10 reps - 5 seconds hold - Seated Hamstring Stretch with Strap  - 2-4 x daily - 7 x weekly - 1 sets - 3 reps - 20-30 seconds hold - Sit to Stand with Counter Support  - 2 x daily - 6 x weekly - 1-2 sets - 10 reps   ASSESSMENT:   CLINICAL IMPRESSION:  HMikennareports good HEP compliance.  She seems to be  happiest with activities that alternate between flexion and extension (like AAROM on the bike, standing flexion/extension on the step and leg press with push into extension).  Flexion and extension AROM still need more work and are the focus of her current HEP.  Continue POC with particular extension AROM emphasis to meet LTGs.   OBJECTIVE IMPAIRMENTS Abnormal gait, decreased activity tolerance, decreased balance, decreased knowledge of condition, decreased knowledge of use of DME, decreased mobility, difficulty walking, decreased ROM, decreased strength, increased edema, increased muscle spasms, impaired flexibility, postural dysfunction, and pain.    ACTIVITY LIMITATIONS community activity, driving, and personal recreation like Yoga & walking dogs .    PERSONAL FACTORS 1-2 comorbidities: see PMH  are also affecting patient's functional outcome.    REHAB POTENTIAL: Good   CLINICAL DECISION MAKING:  Stable/uncomplicated   EVALUATION COMPLEXITY: Low     GOALS: Goals reviewed with patient? Yes   SHORT TERM GOALS: Target date: 06/22/2021   Patient independent and verbalizes compliance with initial HEP Baseline: SEE OBJECTIVE DATA Goal status: Met 07/26/2021   2.  Patient reports 50% improvement in right knee pain. Baseline: SEE OBJECTIVE DATA Goal status: on going 07/26/2021   3.  PROM right knee extension -3* to flexion 90* Baseline: SEE OBJECTIVE DATA Goal status: on going 05/29/2021   LONG TERM GOALS: Target date: 08/31/2021   Patient will improve FOTO score to 67% Baseline: SEE OBJECTIVE DATA Goal status: ongoing 07/04/2021   2.  Patient reports right knee pain </= 2/10 with standing & gait activities.  Baseline: SEE OBJECTIVE DATA Goal status: ongoing 07/04/2021   3.  Right Knee PROM 0* extension to 110* flexion Baseline: SEE OBJECTIVE DATA Goal status: ongoing 07/04/2021   4.  Right Knee AROM seated -3* extension to 100* flexion Baseline: SEE OBJECTIVE DATA Goal status: ongoing 07/04/2021   5.  Patient ambulates >500' community distances including negotiating ramps, curbs & stairs without device independently. Baseline: SEE OBJECTIVE DATA Goal status: ongoing 07/04/2021   6.  Patient reports ability to perform Yoga & walk her dogs. Baseline: SEE OBJECTIVE DATA Goal status: ongoing 07/04/2021   PLAN: PT FREQUENCY: 3x/week of manipulation, 4x/wk following week, 2-3xwk for 6 weeks   PT DURATION: 8 additional weeks   PLANNED INTERVENTIONS: Therapeutic exercises, Therapeutic activity, Neuromuscular re-education, Balance training, Gait training, Patient/Family education, Joint mobilization, Stair training, Vestibular training, DME instructions, Electrical stimulation, Cryotherapy, Moist heat, scar mobilization, Taping, Vasopneumatic device, physical performance testing and Manual therapy   PLAN FOR NEXT SESSION:  Continue heavy emphasis on AROM (extension) with priority on  exercises that alternate between flexion & extension, Standing balance to facilitate weight bearing with emphasis on extension.  Take range measurements and update goals.  Farley Ly PT, MPT 07/25/2021, 9:39 AM

## 2021-07-27 ENCOUNTER — Ambulatory Visit (INDEPENDENT_AMBULATORY_CARE_PROVIDER_SITE_OTHER): Payer: No Typology Code available for payment source | Admitting: Orthopedic Surgery

## 2021-07-27 ENCOUNTER — Ambulatory Visit (INDEPENDENT_AMBULATORY_CARE_PROVIDER_SITE_OTHER): Payer: No Typology Code available for payment source | Admitting: Physical Therapy

## 2021-07-27 ENCOUNTER — Encounter: Payer: Self-pay | Admitting: Physical Therapy

## 2021-07-27 DIAGNOSIS — M25561 Pain in right knee: Secondary | ICD-10-CM | POA: Diagnosis not present

## 2021-07-27 DIAGNOSIS — M25661 Stiffness of right knee, not elsewhere classified: Secondary | ICD-10-CM | POA: Diagnosis not present

## 2021-07-27 DIAGNOSIS — R2689 Other abnormalities of gait and mobility: Secondary | ICD-10-CM

## 2021-07-27 DIAGNOSIS — R600 Localized edema: Secondary | ICD-10-CM | POA: Diagnosis not present

## 2021-07-27 DIAGNOSIS — M6281 Muscle weakness (generalized): Secondary | ICD-10-CM | POA: Diagnosis not present

## 2021-07-27 DIAGNOSIS — M24661 Ankylosis, right knee: Secondary | ICD-10-CM

## 2021-07-27 NOTE — Therapy (Signed)
OUTPATIENT PHYSICAL THERAPY TREATMENT NOTE   Patient Name: Cheryl Tate MRN: [id number] DOB:[birth year], 51 y.o., female Today's Date: 07/27/2021   END OF SESSION:   PT End of Session - 07/27/21 1133     Visit Number 27    Number of Visits 36    Date for PT Re-Evaluation 08/31/21    Authorization Type U[employer]    Authorization Time Period [employer] CHOICE  NO VISIT LIMIT, PATIENT PAYS 100% UNTIL DED IS MET, DED $7500 [id number] MET, TERM DATE 09/19/2021    Progress Note Due on Visit 26    PT Start Time 1100    PT Stop Time 1145    PT Time Calculation (min) 45 min    Activity Tolerance Patient limited by pain    Behavior During Therapy WFL for tasks assessed/performed              Past Medical History:  Diagnosis Date   Anemia    Depression    Menorrhagia    Mood disorder (HCC)    PONV (postoperative nausea and vomiting)    Von Willebrand's disease ([place])    followed by hemotologist Dr. Marin Olp   Past Surgical History:  Procedure Laterality Date   APPENDECTOMY  01/22/2004   DILATION AND CURETTAGE OF UTERUS  2002   x 2 in 2002   DILITATION & CURRETTAGE/HYSTROSCOPY WITH HYDROTHERMAL ABLATION N/A 11/22/2014   Procedure: HYSTEROSCOPY WITH HYDROTHERMAL ABLATION;  Surgeon: Dian Queen, MD;  Location: [facility];  Service: Gynecology;  Laterality: N/A;   EXAM UNDER ANESTHESIA WITH MANIPULATION OF KNEE Right 07/12/2021   Procedure: RIGHT EXAM UNDER ANESTHESIA WITH MANIPULATION OF KNEE;  Surgeon: Meredith Pel, MD;  Location: [facility];  Service: Orthopedics;  Laterality: Right;   HYSTEROSCOPY WITH D & C  08-08-2014  in [place]   MULTIPLE TOOTH EXTRACTIONS     TOTAL KNEE ARTHROPLASTY Right 05/22/2021   Procedure: RIGHT TOTAL KNEE ARTHROPLASTY;  Surgeon: Meredith Pel, MD;  Location: [facility];  Service: Orthopedics;  Laterality: Right;   Patient Active Problem List   Diagnosis Date Noted   Arthritis of right knee    S/P knee replacement 05/22/2021   BMI  33.0-33.9,adult 06/16/2015   Von Willebrand disease ([place]) 06/13/2015   Depression 12/29/2014   Rhinitis, allergic 12/28/2014    PCP: Eunice Blase, MD   REFERRING PROVIDER: Donella Stade, PA-C   REFERRING DIAG: [phone] (ICD-10-CM) - History of total knee arthroplasty, unspecified laterality  ONSET DATE: 05/22/2021 Right TKA   THERAPY DIAG:  Stiffness of right knee, not elsewhere classified  Acute pain of right knee  Localized edema  Muscle weakness (generalized)  Other abnormalities of gait and mobility  PERTINENT HISTORY: Von Willebrand disease, depression, Menorrhgia  PRECAUTIONS: None  SUBJECTIVE:  pain is improving some, still with stiffness and soreness to report in her knee  PAIN:  NPRS scale:  0-6/10 (mostly at night) Pain location: right knee all over.  Pain description: sharp, sore, cramps Aggravating factors: fall Relieving factors: ice, meds   OBJECTIVE:    PATIENT SURVEYS:  05/28/2021 FOTO 29% functional & target 67%   COGNITION: 05/28/2021 Overall cognitive status: Within functional limits for tasks assessed                EDEMA: 05/28/2021 RLE above knee 51.8cm around knee 48cm below knee  41.2cm LLE above knee 45.8cm around knee 40cm below knee 34.1cm   PALPATION: 05/28/2021 Bruising along medial knee & lower leg, tenderness over joint  line, quads, hamstrings   LE ROM:   ROM P: Passive  A: Active Right 05/28/21 Right 05/31/21 Right 06/05/21 Right 06/06/21 Right 06/11/21 Right 06/14/21 Right 06/19/21 Right 06/26/21 Right 06/28/21 Right 07/02/21 Right 07/13/21 Right 07/19/21 Right 07/20/21 Right 07/23/2021 Right 07/27/21  Hip flexion                  Hip extension                  Hip abduction                  Hip adduction                  Hip internal rotation                  Hip external rotation                  Knee flexion Supine P: 64* Seated  A: 64* Supine AROM heel slide:61 PROM overpressure in heel slide : 66   Supine AA: 73* Seated   P: 75*  Seated P: 79* Seated P: 75 Seated P: 70 with muscle guarding limiting Seated P:78 after manual therapy Seated P: 75*  A: 64* after manual therapy Supine A 70 P 78 AA 84 (after exercise) Seated P:85 After maual therapy Seated P:93 after manual therapy Seated P: 97*   Knee extension Supine P: -10* Seated  A: -31* Seated AROM LAQ: -21 Seated LAQ A: -14* Supine P: -9* Supine A: -8 Supine P: -7* Supine A: -8 P:-6 Supine A:-8 P:-5  Seated P: -7* after manual therapy Seated  A -16 (seated LAQ)   Supine P: -9* Supine  P:-9 after manual therapy  Ankle dorsiflexion                  Ankle plantarflexion                  Ankle inversion                  Ankle eversion                   (Blank rows = not tested)   LE MMT:   MMT Right 05/28/2021 Left 05/28/2021  Hip flexion      Hip extension      Hip abduction      Hip adduction      Hip internal rotation      Hip external rotation      Knee flexion 2/5   Knee extension 2/5   Ankle dorsiflexion      Ankle plantarflexion      Ankle inversion      Ankle eversion       (Blank rows = not tested)   GAIT: 05/28/2021 Distance walked: 29' Assistive device utilized:  4 footed walker level of assistance: SBA  verbal cues needed Comments: antalgic, right knee flexed in stance, limited to no increase flexion for swing, RLE abducted,    Functional Activities: Pt using LLE to move RLE in & out of bed.  PT min guard RLE and she was able to move RLE without assisting with LLE.    TODAY'S TREATMENT: 07/27/21 Seated heel prop in chair low load long duration stretch (LLLD) 10# X 5 min then reduced to 5# for another 5 min Seated hamstring stretch with self overpressure 30 sec X 3 Supine heel prop with quad sets 5  sec X20 Prone LLLD stretch 1 min X3 Leg press DL 75# holding max flexion and extension 5 sec each X 15, then same thing with one leg at 37# Sci fit bike full revolutions X 5 min seat #9  Manual therapy: in prone  for extension mobs A-P 60 sec bout followed by manual knee extension stretch to max tolerance holding 30 seconds X 5 rounds. IASTM and trigger point release to hamstrings and gastroc in prone.  07/26/2021 Recumbent bike Seat 5 AAROM for 5 minutes T[place]gate knee flexion 1 minute Seated knee flexion AAROM (L pushes R into flexion) 10X 10 seconds Prone knee extension stretch 1.5# for 3 minutes Quadriceps sets with heel prop 2 sets of 10 for 5 seconds  Functional Activities: Leg Press DL 20X 100# with push into extension and stretch into flexion 5 seconds each & 10X 37# R leg only Step-up and stretch into flexion and extension 10X 5 seconds each (flexion and extension)  Vaso R knee 10 minutes Medium Pressure 34* post-treatment   07/25/2021 Therapeutic Exercise -SciFit recumbent bike seat 6 rocking for stretch first 2 min, then full revolutions for 4 min using BLEs/BUEs, then 75mn level 1 with LEs only.  Total of 8 min work.  -seated heel slides on pillow case 5 sec hold for flex & ext with end range self-assist for 4 min -prone knee flexion 10 reps and active ext / quad set 10 reps. Progressed to knee flexion then internal rotation of hip to external rotation of hip to knee ext 10 reps.   Therapeutic Activities -Sit to/from stand with PT manual assist at pelvis to engage RLE and Standing terminal knee ext with red theraband (x5), progressing to include RLE SLS lifting left heel (x5), then to lifting LLE off floor (x5) with required LUE cane support and tactile facilitation at quad - 15 reps total.  Progressed gait from RW to cane with supervision for 50'  -Manual therapy: Rt knee PROM with overpressure seated with mobs grade 3 to her tolerance.  Soft tissue mobs to medial & posterior knee.   -Modalaties: Vasopnuematic X 10 min medium compression 34 deg to Rt knee with knee positioned for ext stretch 10 min.    07/23/2021 -Nu step seat 5 X 8 min holding flexion and ext ROM 5 sec LE/UE -seated  LAQ & active knee flexion with contralateral LE opposing motion 10 reps 3 sets. -prone knee flexion 10 reps and active ext / quad set 10 reps. Progressed to knee flexion then internal rotation of hip to external rotation of hip to knee ext 10 reps.  Quad sets supine 10 reps sit to/from stand with PT manual assist at pelvis to engage RLE and Standing terminal knee ext with red theraband progressing to include RLE SLS lifting left heel then to lifting LLE off floor (required RW support) 15 reps total.   -Manual therapy: Rt knee PROM with overpressure seated, prone & supine with mobs grade 3 to her tolerance.  Soft tissue mobs to medial & posterior knee.   -Modalaties: Vasopnuematic X 10 min medium compression 34 deg to Rt knee with knee positioned for ext stretch 5 min.     HOME EXERCISE PROGRAM: Access Code: [email or web address]: [email or web address] Date: 06/19/2021 Prepared by: BElsie Ra Exercises - Quad Setting and Stretching  - 2-4 x daily - 7 x weekly - 5-10 sets - 10 reps - prop 5-10 minutes & quad set5 seconds hold - Supine Heel Slide with Strap  - 2-3  x daily - 7 x weekly - 2-3 sets - 10 reps - 5 seconds hold - Seated Knee Flexion Stretch  - 2 x daily - 6 x weekly - 1-2 sets - 10 reps - 5 sec hold - Seated straight leg lifts  - 2-3 x daily - 7 x weekly - 2-3 sets - 10 reps - 5 seconds hold - Seated Hamstring Stretch with Strap  - 2-4 x daily - 7 x weekly - 1 sets - 3 reps - 20-30 seconds hold - Sit to Stand with Counter Support  - 2 x daily - 6 x weekly - 1-2 sets - 10 reps   ASSESSMENT:   CLINICAL IMPRESSION:  She arrives with more stiffness into extension so we focused session on working to improve this from missing 20 get to missing 9 deg. I reviewed low load long duration heel prop in sitting, standing, prone for her to continue to work on at home as well as quad sets and hamstring stretching all to help. She shows good understanding and relays she will buy cuff  weights at home to help her stretch.   OBJECTIVE IMPAIRMENTS Abnormal gait, decreased activity tolerance, decreased balance, decreased knowledge of condition, decreased knowledge of use of DME, decreased mobility, difficulty walking, decreased ROM, decreased strength, increased edema, increased muscle spasms, impaired flexibility, postural dysfunction, and pain.    ACTIVITY LIMITATIONS community activity, driving, and personal recreation like Yoga & walking dogs .    PERSONAL FACTORS 1-2 comorbidities: see PMH  are also affecting patient's functional outcome.    REHAB POTENTIAL: Good   CLINICAL DECISION MAKING: Stable/uncomplicated   EVALUATION COMPLEXITY: Low     GOALS: Goals reviewed with patient? Yes   SHORT TERM GOALS: Target date: 06/22/2021   Patient independent and verbalizes compliance with initial HEP Baseline: SEE OBJECTIVE DATA Goal status: Met 07/26/2021   2.  Patient reports 50% improvement in right knee pain. Baseline: SEE OBJECTIVE DATA Goal status: on going 07/26/2021   3.  PROM right knee extension -3* to flexion 90* Baseline: SEE OBJECTIVE DATA Goal status: on going 05/29/2021   LONG TERM GOALS: Target date: 08/31/2021   Patient will improve FOTO score to 67% Baseline: SEE OBJECTIVE DATA Goal status: ongoing 07/04/2021   2.  Patient reports right knee pain </= 2/10 with standing & gait activities.  Baseline: SEE OBJECTIVE DATA Goal status: ongoing 07/04/2021   3.  Right Knee PROM 0* extension to 110* flexion Baseline: SEE OBJECTIVE DATA Goal status: ongoing 07/04/2021   4.  Right Knee AROM seated -3* extension to 100* flexion Baseline: SEE OBJECTIVE DATA Goal status: ongoing 07/04/2021   5.  Patient ambulates >500' community distances including negotiating ramps, curbs & stairs without device independently. Baseline: SEE OBJECTIVE DATA Goal status: ongoing 07/04/2021   6.  Patient reports ability to perform Yoga & walk her dogs. Baseline: SEE OBJECTIVE  DATA Goal status: ongoing 07/04/2021   PLAN: PT FREQUENCY: 3x/week of manipulation, 4x/wk following week, 2-3xwk for 6 weeks   PT DURATION: 8 additional weeks   PLANNED INTERVENTIONS: Therapeutic exercises, Therapeutic activity, Neuromuscular re-education, Balance training, Gait training, Patient/Family education, Joint mobilization, Stair training, Vestibular training, DME instructions, Electrical stimulation, Cryotherapy, Moist heat, scar mobilization, Taping, Vasopneumatic device, physical performance testing and Manual therapy   PLAN FOR NEXT SESSION:  Continue heavy emphasis on AROM (extension) with priority on exercises that alternate between flexion & extension, Standing balance to facilitate weight bearing with emphasis on extension.    Aaron Edelman  Meda Coffee, PT, DPT 07/27/21 11:46 AM

## 2021-07-28 ENCOUNTER — Encounter: Payer: Self-pay | Admitting: Orthopedic Surgery

## 2021-07-28 NOTE — Progress Notes (Signed)
Post-Op Visit Note   Patient: Cheryl Tate           Date of Birth: 05/15/1970           MRN: [id number] Visit Date: 07/27/2021 PCP: Lavada Mesi, MD   Assessment & Plan:  Chief Complaint:  Chief Complaint  Patient presents with   Right Knee - Follow-up   Visit Diagnoses:  1. Arthrofibrosis of knee joint, right     Plan: Cheryl Tate is a 51 year old patient who underwent right TKA 05/22/2021 and manipulation under anesthesia 07/12/2021.  We were able to get good flexion.  She was having pain postop and did not fully weight-bear on it for the first week or 2 after that and has since developed a flexion contracture.  She does have von Willebrand's disease.  Cannot really take anti-inflammatories.  On examination she has about a 20 degree flexion contracture and bends to 75.  Taking Tylenol gabapentin and muscle relaxer.  At this point I encouraged Cheryl Tate to work to achieve less than 10 degrees of extension because she was very close to full extension prior to her manipulation.  Since it has only been 2 weeks since her manipulation I think she has a good chance of getting that back but will take significant effort on the order of 6 to 8 hours/day.  Also continue working on flexion with the CPM machine.  I think it would be good for her to get a turnbuckle knee extension brace to wear at night as well and we have called [employer] about that to see if we can facilitate that brace.  Okay to go back onto her hormones.  Encouraged heel strike with weightbearing as tolerated.  Come back in 4 weeks for clinical recheck.  Follow-Up Instructions: No follow-ups on file.   Orders:  No orders of the defined types were placed in this encounter.  No orders of the defined types were placed in this encounter.   Imaging: No results found.  PMFS History: Patient Active Problem List   Diagnosis Date Noted   Arthritis of right knee    S/P knee replacement 05/22/2021   BMI 33.0-33.9,adult  06/16/2015   Von Willebrand disease (H[facility]) 06/13/2015   Depression 12/29/2014   Rhinitis, allergic 12/28/2014   Past Medical History:  Diagnosis Date   Anemia    Depression    Menorrhagia    Mood disorder (HCC)    PONV (postoperative nausea and vomiting)    Von Willebrand's disease ([facility])    followed by hemotologist Dr. Myna Hidalgo    Family History  Problem Relation Age of Onset   Mental retardation Mother    Mental retardation Brother    Mental retardation Maternal Grandmother     Past Surgical History:  Procedure Laterality Date   APPENDECTOMY  01/22/2004   DILATION AND CURETTAGE OF UTERUS  2002   x 2 in 2002   DILITATION & CURRETTAGE/HYSTROSCOPY WITH HYDROTHERMAL ABLATION N/A 11/22/2014   Procedure: HYSTEROSCOPY WITH HYDROTHERMAL ABLATION;  Surgeon: Marcelle Overlie, MD;  Location: [facility];  Service: Gynecology;  Laterality: N/A;   EXAM UNDER ANESTHESIA WITH MANIPULATION OF KNEE Right 07/12/2021   Procedure: RIGHT EXAM UNDER ANESTHESIA WITH MANIPULATION OF KNEE;  Surgeon: Cammy Copa, MD;  Location: [facility];  Service: Orthopedics;  Laterality: Right;   HYSTEROSCOPY WITH D & C  08-08-2014  in [place]   MULTIPLE TOOTH EXTRACTIONS     TOTAL KNEE ARTHROPLASTY Right 05/22/2021   Procedure: RIGHT TOTAL KNEE  ARTHROPLASTY;  Surgeon: Cammy Copa, MD;  Location: [facility];  Service: Orthopedics;  Laterality: Right;   Social History   Occupational History   Occupation: Investment banker, operational, Transport planner  Tobacco Use   Smoking status: Every Day    Packs/day: 0.20    Years: 25.00    Total pack years: 5.00    Types: Cigarettes   Smokeless tobacco: Never   Tobacco comments:    2 cig daily  Vaping Use   Vaping Use: Never used  Substance and Sexual Activity   Alcohol use: Yes    Alcohol/week: 7.0 standard drinks of alcohol    Types: 7 Glasses of wine per week    Comment: one wine daily   Drug use: Yes    Types: Marijuana    Comment: helps with sleep nightly   Sexual  activity: Yes

## 2021-07-29 DIAGNOSIS — M24661 Ankylosis, right knee: Secondary | ICD-10-CM

## 2021-07-30 ENCOUNTER — Encounter: Payer: Self-pay | Admitting: Physical Therapy

## 2021-07-30 ENCOUNTER — Ambulatory Visit (INDEPENDENT_AMBULATORY_CARE_PROVIDER_SITE_OTHER): Payer: No Typology Code available for payment source | Admitting: Physical Therapy

## 2021-07-30 DIAGNOSIS — M6281 Muscle weakness (generalized): Secondary | ICD-10-CM | POA: Diagnosis not present

## 2021-07-30 DIAGNOSIS — R2689 Other abnormalities of gait and mobility: Secondary | ICD-10-CM

## 2021-07-30 DIAGNOSIS — R600 Localized edema: Secondary | ICD-10-CM | POA: Diagnosis not present

## 2021-07-30 DIAGNOSIS — M25661 Stiffness of right knee, not elsewhere classified: Secondary | ICD-10-CM | POA: Diagnosis not present

## 2021-07-30 DIAGNOSIS — M25561 Pain in right knee: Secondary | ICD-10-CM | POA: Diagnosis not present

## 2021-07-30 DIAGNOSIS — R2681 Unsteadiness on feet: Secondary | ICD-10-CM

## 2021-07-30 NOTE — Therapy (Signed)
OUTPATIENT PHYSICAL THERAPY TREATMENT NOTE   Patient Name: Cheryl Tate MRN: [id number] DOB:[birth year], 51 y.o., female Today's Date: 07/30/2021   END OF SESSION:   PT End of Session - 07/30/21 0852     Visit Number 28    Number of Visits 36    Date for PT Re-Evaluation 08/31/21    Authorization Type U[employer]    Authorization Time Period [facility] CHOICE  NO VISIT LIMIT, PATIENT PAYS 100% UNTIL DED IS MET, DED $7500 [id number] MET, TERM DATE 09/19/2021    Progress Note Due on Visit 30    PT Start Time 0849    PT Stop Time 0941    PT Time Calculation (min) 52 min    Activity Tolerance Patient limited by pain    Behavior During Therapy [facility] for tasks assessed/performed               Past Medical History:  Diagnosis Date   Anemia    Depression    Menorrhagia    Mood disorder (HCC)    PONV (postoperative nausea and vomiting)    Von Willebrand's disease ([place])    followed by hemotologist Dr. Marin Olp   Past Surgical History:  Procedure Laterality Date   APPENDECTOMY  01/22/2004   DILATION AND CURETTAGE OF UTERUS  2002   x 2 in 2002   DILITATION & CURRETTAGE/HYSTROSCOPY WITH HYDROTHERMAL ABLATION N/A 11/22/2014   Procedure: HYSTEROSCOPY WITH HYDROTHERMAL ABLATION;  Surgeon: Dian Queen, MD;  Location: [place];  Service: Gynecology;  Laterality: N/A;   EXAM UNDER ANESTHESIA WITH MANIPULATION OF KNEE Right 07/12/2021   Procedure: RIGHT EXAM UNDER ANESTHESIA WITH MANIPULATION OF KNEE;  Surgeon: Meredith Pel, MD;  Location: [place];  Service: Orthopedics;  Laterality: Right;   HYSTEROSCOPY WITH D & C  08-08-2014  in [place]   MULTIPLE TOOTH EXTRACTIONS     TOTAL KNEE ARTHROPLASTY Right 05/22/2021   Procedure: RIGHT TOTAL KNEE ARTHROPLASTY;  Surgeon: Meredith Pel, MD;  Location: [place];  Service: Orthopedics;  Laterality: Right;   Patient Active Problem List   Diagnosis Date Noted   Fibrosis of right knee joint    Arthritis of right knee    S/P knee  replacement 05/22/2021   BMI 33.0-33.9,adult 06/16/2015   Von Willebrand disease ([place]) 06/13/2015   Depression 12/29/2014   Rhinitis, allergic 12/28/2014    PCP: Eunice Blase, MD   REFERRING PROVIDER: Donella Stade, PA-C   REFERRING DIAG: [phone] (ICD-10-CM) - History of total knee arthroplasty, unspecified laterality  ONSET DATE: 05/22/2021 Right TKA   THERAPY DIAG:  Stiffness of right knee, not elsewhere classified  Acute pain of right knee  Localized edema  Muscle weakness (generalized)  Other abnormalities of gait and mobility  Unsteadiness on feet  PERTINENT HISTORY: Von Willebrand disease, depression, Menorrhgia  PRECAUTIONS: None  SUBJECTIVE:  She did the new exercises over the weekend, including the propped weighted extension 5x daily and feels like it is working to straighten the knee. She doesn't take oxycodone now and started driving, reporting no pain with the R knee while braking.  PAIN:  NPRS scale: 0/10 entering clinic Pain location: right knee all over.  Pain description: sharp, sore, cramps Aggravating factors: fall Relieving factors: ice, meds   OBJECTIVE:    PATIENT SURVEYS:  05/28/2021 FOTO 29% functional & target 67%   COGNITION: 05/28/2021 Overall cognitive status: Within functional limits for tasks assessed  EDEMA: 05/28/2021 RLE above knee 51.8cm around knee 48cm below knee  41.2cm LLE above knee 45.8cm around knee 40cm below knee 34.1cm   PALPATION: 05/28/2021 Bruising along medial knee & lower leg, tenderness over joint line, quads, hamstrings   LE ROM:   ROM P: Passive  A: Active Right 05/28/21 Right 05/31/21 Right 06/05/21 Right 06/06/21 Right 06/11/21 Right 06/14/21 Right 06/19/21 Right 06/26/21 Right 06/28/21 Right 07/02/21 Right 07/13/21 Right 07/19/21 Right 07/20/21 Right 07/23/2021 Right 07/27/21 Right 07/30/21  Hip flexion                   Hip extension                   Hip abduction                   Hip adduction                    Hip internal rotation                   Hip external rotation                   Knee flexion Supine P: 64* Seated  A: 64* Supine AROM heel slide:61 PROM overpressure in heel slide : 66   Supine AA: 73* Seated  P: 75*  Seated P: 79* Seated P: 75 Seated P: 70 with muscle guarding limiting Seated P:78 after manual therapy Seated P: 75*  A: 64* after manual therapy Supine A 70 P 78 AA 84 (after exercise) Seated P:85 After maual therapy Seated P:93 after manual therapy Seated P: 97*  Seated P: 98*  Knee extension Supine P: -10* Seated  A: -31* Seated AROM LAQ: -21 Seated LAQ A: -14* Supine P: -9* Supine A: -8 Supine P: -7* Supine A: -8 P:-6 Supine A:-8 P:-5  Seated P: -7* after manual therapy Seated  A -16 (seated LAQ)   Supine P: -9* Supine  P:-9 after manual therapy Supine P: -8* after manual  Ankle dorsiflexion                   Ankle plantarflexion                   Ankle inversion                   Ankle eversion                    (Blank rows = not tested)   LE MMT:   MMT Right 05/28/2021 Left 05/28/2021  Hip flexion      Hip extension      Hip abduction      Hip adduction      Hip internal rotation      Hip external rotation      Knee flexion 2/5   Knee extension 2/5   Ankle dorsiflexion      Ankle plantarflexion      Ankle inversion      Ankle eversion       (Blank rows = not tested)   GAIT: 05/28/2021 Distance walked: 63' Assistive device utilized:  4 footed walker level of assistance: SBA  verbal cues needed Comments: antalgic, right knee flexed in stance, limited to no increase flexion for swing, RLE abducted,    Functional Activities: Pt using LLE to move RLE in & out of bed.  PT min guard RLE  and she was able to move RLE without assisting with LLE.    TODAY'S TREATMENT: 07/30/2021 Therapeutic Exercise: SciFit Seat 6 AAROM rocking 2.5 min, Seat 8 cycling 5 min, Seat 7 cycling 1 min Standing gastroc stretch with heel  depression with verbal and tactile cues for placement and weight distribution, 2x 30s holds Seated hamstring stretch with strap with verbal and tactile cueing for positioning and rationale, 2x 30s holds Supine hamstring stretch with strap and PT manual assist into knee extension 1x 30s  PT reviewed heel prop for ext and pt verbalized better understanding.   Therapeutic Activity: RLE step up and down on 6" step in //bars x10, PT demo and verbal cues for foot placement, knee flexion/extension, and weight shifting  Manual Therapy: Standing knee extension mobs with belt and gastroc stretch 5x10 Supine knee extension PROM x1 min  Vaso R knee 10 minutes High Pressure 34* post-treatment with knee extension prop  07/27/21 Seated heel prop in chair low load long duration stretch (LLLD) 10# X 5 min then reduced to 5# for another 5 min Seated hamstring stretch with self overpressure 30 sec X 3 Supine heel prop with quad sets 5 sec X20 Prone LLLD stretch 1 min X3 Leg press DL 75# holding max flexion and extension 5 sec each X 15, then same thing with one leg at 37# Sci fit bike full revolutions X 5 min seat #9  Manual therapy: in prone for extension mobs A-P 60 sec bout followed by manual knee extension stretch to max tolerance holding 30 seconds X 5 rounds. IASTM and trigger point release to hamstrings and gastroc in prone.  07/26/2021 Recumbent bike Seat 5 AAROM for 5 minutes Tailgate knee flexion 1 minute Seated knee flexion AAROM (L pushes R into flexion) 10X 10 seconds Prone knee extension stretch 1.5# for 3 minutes Quadriceps sets with heel prop 2 sets of 10 for 5 seconds  Functional Activities: Leg Press DL 20X 100# with push into extension and stretch into flexion 5 seconds each & 10X 37# R leg only Step-up and stretch into flexion and extension 10X 5 seconds each (flexion and extension)  Vaso R knee 10 minutes Medium Pressure 34* post-treatment    HOME EXERCISE PROGRAM: Access Code:  [email or web address] URL: [email or web address] Date: 06/19/2021 Prepared by: Elsie Ra  Exercises - Quad Setting and Stretching  - 2-4 x daily - 7 x weekly - 5-10 sets - 10 reps - prop 5-10 minutes & quad set5 seconds hold - Supine Heel Slide with Strap  - 2-3 x daily - 7 x weekly - 2-3 sets - 10 reps - 5 seconds hold - Seated Knee Flexion Stretch  - 2 x daily - 6 x weekly - 1-2 sets - 10 reps - 5 sec hold - Seated straight leg lifts  - 2-3 x daily - 7 x weekly - 2-3 sets - 10 reps - 5 seconds hold - Seated Hamstring Stretch with Strap  - 2-4 x daily - 7 x weekly - 1 sets - 3 reps - 20-30 seconds hold - Sit to Stand with Counter Support  - 2 x daily - 6 x weekly - 1-2 sets - 10 reps   ASSESSMENT:   CLINICAL IMPRESSION:  Patient is tolerating weight bearing on RLE better to enable gait with cane.  PT continues to work on exercises that alternate between flexion & ext.    OBJECTIVE IMPAIRMENTS Abnormal gait, decreased activity tolerance, decreased balance, decreased knowledge of condition, decreased knowledge of use of  DME, decreased mobility, difficulty walking, decreased ROM, decreased strength, increased edema, increased muscle spasms, impaired flexibility, postural dysfunction, and pain.    ACTIVITY LIMITATIONS community activity, driving, and personal recreation like Yoga & walking dogs .    PERSONAL FACTORS 1-2 comorbidities: see PMH  are also affecting patient's functional outcome.    REHAB POTENTIAL: Good   CLINICAL DECISION MAKING: Stable/uncomplicated   EVALUATION COMPLEXITY: Low     GOALS: Goals reviewed with patient? Yes   SHORT TERM GOALS: Target date: 06/22/2021   Patient independent and verbalizes compliance with initial HEP Baseline: SEE OBJECTIVE DATA Goal status: Met 07/26/2021   2.  Patient reports 50% improvement in right knee pain. Baseline: SEE OBJECTIVE DATA Goal status: on going 07/26/2021   3.  PROM right knee extension -3* to flexion 90* Baseline:  SEE OBJECTIVE DATA Goal status: on going 05/29/2021   LONG TERM GOALS: Target date: 08/31/2021   Patient will improve FOTO score to 67% Baseline: SEE OBJECTIVE DATA Goal status: ongoing 07/04/2021   2.  Patient reports right knee pain </= 2/10 with standing & gait activities.  Baseline: SEE OBJECTIVE DATA Goal status: ongoing 07/04/2021   3.  Right Knee PROM 0* extension to 110* flexion Baseline: SEE OBJECTIVE DATA Goal status: ongoing 07/04/2021   4.  Right Knee AROM seated -3* extension to 100* flexion Baseline: SEE OBJECTIVE DATA Goal status: ongoing 07/04/2021   5.  Patient ambulates >500' community distances including negotiating ramps, curbs & stairs without device independently. Baseline: SEE OBJECTIVE DATA Goal status: ongoing 07/04/2021   6.  Patient reports ability to perform Yoga & walk her dogs. Baseline: SEE OBJECTIVE DATA Goal status: ongoing 07/04/2021   PLAN: PT FREQUENCY: 3x/week of manipulation, 4x/wk following week, 2-3xwk for 6 weeks   PT DURATION: 8 additional weeks   PLANNED INTERVENTIONS: Therapeutic exercises, Therapeutic activity, Neuromuscular re-education, Balance training, Gait training, Patient/Family education, Joint mobilization, Stair training, Vestibular training, DME instructions, Electrical stimulation, Cryotherapy, Moist heat, scar mobilization, Taping, Vasopneumatic device, physical performance testing and Manual therapy   PLAN FOR NEXT SESSION:  Reinforce stretching to all muscles crossing the knee and hip flexors, Continue emphasis on AROM (extension), Increase tolerance to step negotiation with RLE  Jamey Reas, PT, DPT 07/30/21 4:21 PM

## 2021-07-31 ENCOUNTER — Other Ambulatory Visit: Payer: Self-pay | Admitting: Surgical

## 2021-07-31 ENCOUNTER — Encounter: Payer: Self-pay | Admitting: Physical Therapy

## 2021-07-31 ENCOUNTER — Ambulatory Visit (INDEPENDENT_AMBULATORY_CARE_PROVIDER_SITE_OTHER): Payer: No Typology Code available for payment source | Admitting: Physical Therapy

## 2021-07-31 DIAGNOSIS — R2689 Other abnormalities of gait and mobility: Secondary | ICD-10-CM | POA: Diagnosis not present

## 2021-07-31 DIAGNOSIS — M25661 Stiffness of right knee, not elsewhere classified: Secondary | ICD-10-CM

## 2021-07-31 DIAGNOSIS — M6281 Muscle weakness (generalized): Secondary | ICD-10-CM

## 2021-07-31 DIAGNOSIS — M25561 Pain in right knee: Secondary | ICD-10-CM

## 2021-07-31 DIAGNOSIS — R600 Localized edema: Secondary | ICD-10-CM

## 2021-07-31 NOTE — Therapy (Addendum)
OUTPATIENT PHYSICAL THERAPY TREATMENT NOTE   Patient Name: Cheryl Tate MRN: [id number] DOB:04/13/1970, 51 y.o., female Today's Date: 07/31/2021   END OF SESSION:   PT End of Session - 07/31/21 1606     Visit Number 29    Number of Visits 36    Date for PT Re-Evaluation 08/31/21    Authorization Type UHC    Authorization Time Period [facility] CHOICE  NO VISIT LIMIT, PATIENT PAYS 100% UNTIL DED IS MET, DED $7500 [id number] MET, TERM DATE 09/19/2021    Progress Note Due on Visit 45    PT Start Time 1557    PT Stop Time 1645    PT Time Calculation (min) 48 min    Activity Tolerance Patient limited by pain    Behavior During Therapy WFL for tasks assessed/performed               Past Medical History:  Diagnosis Date   Anemia    Depression    Menorrhagia    Mood disorder (HCC)    PONV (postoperative nausea and vomiting)    Von Willebrand's disease ([place])    followed by hemotologist Dr. Marin Olp   Past Surgical History:  Procedure Laterality Date   APPENDECTOMY  01/22/2004   DILATION AND CURETTAGE OF UTERUS  2002   x 2 in 2002   DILITATION & CURRETTAGE/HYSTROSCOPY WITH HYDROTHERMAL ABLATION N/A 11/22/2014   Procedure: HYSTEROSCOPY WITH HYDROTHERMAL ABLATION;  Surgeon: Dian Queen, MD;  Location: [place];  Service: Gynecology;  Laterality: N/A;   EXAM UNDER ANESTHESIA WITH MANIPULATION OF KNEE Right 07/12/2021   Procedure: RIGHT EXAM UNDER ANESTHESIA WITH MANIPULATION OF KNEE;  Surgeon: Meredith Pel, MD;  Location: [place];  Service: Orthopedics;  Laterality: Right;   HYSTEROSCOPY WITH D & C  08-08-2014  in [place]   MULTIPLE TOOTH EXTRACTIONS     TOTAL KNEE ARTHROPLASTY Right 05/22/2021   Procedure: RIGHT TOTAL KNEE ARTHROPLASTY;  Surgeon: Meredith Pel, MD;  Location: [place];  Service: Orthopedics;  Laterality: Right;   Patient Active Problem List   Diagnosis Date Noted   Fibrosis of right knee joint    Arthritis of right knee    S/P knee  replacement 05/22/2021   BMI 33.0-33.9,adult 06/16/2015   Von Willebrand disease ([place]) 06/13/2015   Depression 12/29/2014   Rhinitis, allergic 12/28/2014    PCP: Eunice Blase, MD   REFERRING PROVIDER: Donella Stade, PA-C   REFERRING DIAG: [phone] (ICD-10-CM) - History of total knee arthroplasty, unspecified laterality  ONSET DATE: 05/22/2021 Right TKA   THERAPY DIAG:  Stiffness of right knee, not elsewhere classified  Acute pain of right knee  Muscle weakness (generalized)  Other abnormalities of gait and mobility  Localized edema  PERTINENT HISTORY: Von Willebrand disease, depression, Menorrhgia  PRECAUTIONS: None  SUBJECTIVE:  She did the new exercises over the weekend, including the propped weighted extension 5x daily and feels like it is working to straighten the knee. She doesn't take oxycodone now and started driving, reporting no pain with the R knee while braking.  PAIN:  NPRS scale: 0/10 entering clinic Pain location: right knee all over.  Pain description: sharp, sore, cramps Aggravating factors: fall Relieving factors: ice, meds   OBJECTIVE:    PATIENT SURVEYS:  05/28/2021 FOTO 29% functional & target 67%   COGNITION: 05/28/2021 Overall cognitive status: Within functional limits for tasks assessed                EDEMA: 05/28/2021  RLE above knee 51.8cm around knee 48cm below knee  41.2cm LLE above knee 45.8cm around knee 40cm below knee 34.1cm   PALPATION: 05/28/2021 Bruising along medial knee & lower leg, tenderness over joint line, quads, hamstrings   LE ROM:   ROM P: Passive  A: Active Right 05/28/21 Right 05/31/21 Right 06/05/21 Right 06/06/21 Right 06/11/21 Right 06/14/21 Right 06/19/21 Right 06/26/21 Right 06/28/21 Right 07/02/21 Right 07/13/21 Right 07/19/21 Right 07/20/21 Right 07/23/2021 Right 07/27/21 Right 07/30/21  Hip flexion                   Hip extension                   Hip abduction                   Hip adduction                   Hip  internal rotation                   Hip external rotation                   Knee flexion Supine P: 64* Seated  A: 64* Supine AROM heel slide:61 PROM overpressure in heel slide : 66   Supine AA: 73* Seated  P: 75*  Seated P: 79* Seated P: 75 Seated P: 70 with muscle guarding limiting Seated P:78 after manual therapy Seated P: 75*  A: 64* after manual therapy Supine A 70 P 78 AA 84 (after exercise) Seated P:85 After maual therapy Seated P:93 after manual therapy Seated P: 97*  Seated P: 98*  Knee extension Supine P: -10* Seated  A: -31* Seated AROM LAQ: -21 Seated LAQ A: -14* Supine P: -9* Supine A: -8 Supine P: -7* Supine A: -8 P:-6 Supine A:-8 P:-5  Seated P: -7* after manual therapy Seated  A -16 (seated LAQ)   Supine P: -9* Supine  P:-9 after manual therapy Supine P: -8* after manual  Ankle dorsiflexion                   Ankle plantarflexion                   Ankle inversion                   Ankle eversion                    (Blank rows = not tested)   LE MMT:   MMT Right 05/28/2021 Left 05/28/2021  Hip flexion      Hip extension      Hip abduction      Hip adduction      Hip internal rotation      Hip external rotation      Knee flexion 2/5   Knee extension 2/5   Ankle dorsiflexion      Ankle plantarflexion      Ankle inversion      Ankle eversion       (Blank rows = not tested)   GAIT: 05/28/2021 Distance walked: 25' Assistive device utilized:  4 footed walker level of assistance: SBA  verbal cues needed Comments: antalgic, right knee flexed in stance, limited to no increase flexion for swing, RLE abducted,    Functional Activities: Pt using LLE to move RLE in & out of bed.  PT min guard RLE and she  was able to move RLE without assisting with LLE.    TODAY'S TREATMENT: 07/31/2021 Therapeutic Exercise: SciFit Seat 6 AAROM rocking 2.5 min, Seat 7 cycling 5.5 min Seated hamstring stretch 30 sec X3 on Rt Seated heelslides AAROM 10 sec X 10  on Rt Seated knee extension stretch heel prop in separate chair 10# X 5 min  Manual Therapy: Supine knee extension and flexion PROM with overpressure to tolerance Supine extension mobs grade 3-4 Supine A-P and P-A femoral-tib mobs grade 3-4 Manual gastroc/hamstring stretching  Vaso R knee 10 minutes High Pressure 34* post-treatment with knee extension prop  07/30/2021 Therapeutic Exercise: SciFit Seat 6 AAROM rocking 2.5 min, Seat 8 cycling 5 min, Seat 7 cycling 1 min Standing gastroc stretch with heel depression with verbal and tactile cues for placement and weight distribution, 2x 30s holds Seated hamstring stretch with strap with verbal and tactile cueing for positioning and rationale, 2x 30s holds Supine hamstring stretch with strap and PT manual assist into knee extension 1x 30s  PT reviewed heel prop for ext and pt verbalized better understanding.   Therapeutic Activity: RLE step up and down on 6" step in //bars x10, PT demo and verbal cues for foot placement, knee flexion/extension, and weight shifting  Manual Therapy: Standing knee extension mobs with belt and gastroc stretch 5x10 Supine knee extension PROM x1 min  Vaso R knee 10 minutes High Pressure 34* post-treatment with knee extension prop  07/27/21 Seated heel prop in chair low load long duration stretch (LLLD) 10# X 5 min then reduced to 5# for another 5 min Seated hamstring stretch with self overpressure 30 sec X 3 Supine heel prop with quad sets 5 sec X20 Prone LLLD stretch 1 min X3 Leg press DL 75# holding max flexion and extension 5 sec each X 15, then same thing with one leg at 37# Sci fit bike full revolutions X 5 min seat #9  Manual therapy: in prone for extension mobs A-P 60 sec bout followed by manual knee extension stretch to max tolerance holding 30 seconds X 5 rounds. IASTM and trigger point release to hamstrings and gastroc in prone.  07/26/2021 Recumbent bike Seat 5 AAROM for 5 minutes Tailgate knee  flexion 1 minute Seated knee flexion AAROM (L pushes R into flexion) 10X 10 seconds Prone knee extension stretch 1.5# for 3 minutes Quadriceps sets with heel prop 2 sets of 10 for 5 seconds  Functional Activities: Leg Press DL 20X 100# with push into extension and stretch into flexion 5 seconds each & 10X 37# R leg only Step-up and stretch into flexion and extension 10X 5 seconds each (flexion and extension)  Vaso R knee 10 minutes Medium Pressure 34* post-treatment    HOME EXERCISE PROGRAM: Access Code: [email or web address] URL: [email or web address] Date: 06/19/2021 Prepared by: Elsie Ra  Exercises - Quad Setting and Stretching  - 2-4 x daily - 7 x weekly - 5-10 sets - 10 reps - prop 5-10 minutes & quad set5 seconds hold - Supine Heel Slide with Strap  - 2-3 x daily - 7 x weekly - 2-3 sets - 10 reps - 5 seconds hold - Seated Knee Flexion Stretch  - 2 x daily - 6 x weekly - 1-2 sets - 10 reps - 5 sec hold - Seated straight leg lifts  - 2-3 x daily - 7 x weekly - 2-3 sets - 10 reps - 5 seconds hold - Seated Hamstring Stretch with Strap  - 2-4 x daily - 7 x weekly -  1 sets - 3 reps - 20-30 seconds hold - Sit to Stand with Counter Support  - 2 x daily - 6 x weekly - 1-2 sets - 10 reps   ASSESSMENT:   CLINICAL IMPRESSION:  She arrives with more inflammation, pain and swelling noted today likely from too much standing activity at home she reports. I did not have her perform any standing activity due to this and instead focused on her ROM which is her biggest deficit anyway. She did appear to have more extension ROM after extensive manual therapy to improve this today.   OBJECTIVE IMPAIRMENTS Abnormal gait, decreased activity tolerance, decreased balance, decreased knowledge of condition, decreased knowledge of use of DME, decreased mobility, difficulty walking, decreased ROM, decreased strength, increased edema, increased muscle spasms, impaired flexibility, postural dysfunction, and  pain.    ACTIVITY LIMITATIONS community activity, driving, and personal recreation like Yoga & walking dogs .    PERSONAL FACTORS 1-2 comorbidities: see PMH  are also affecting patient's functional outcome.    REHAB POTENTIAL: Good   CLINICAL DECISION MAKING: Stable/uncomplicated   EVALUATION COMPLEXITY: Low     GOALS: Goals reviewed with patient? Yes   SHORT TERM GOALS: Target date: 06/22/2021   Patient independent and verbalizes compliance with initial HEP Baseline: SEE OBJECTIVE DATA Goal status: Met 07/26/2021   2.  Patient reports 50% improvement in right knee pain. Baseline: SEE OBJECTIVE DATA Goal status: on going 07/26/2021   3.  PROM right knee extension -3* to flexion 90* Baseline: SEE OBJECTIVE DATA Goal status: on going 05/29/2021   LONG TERM GOALS: Target date: 08/31/2021   Patient will improve FOTO score to 67% Baseline: SEE OBJECTIVE DATA Goal status: ongoing 07/04/2021   2.  Patient reports right knee pain </= 2/10 with standing & gait activities.  Baseline: SEE OBJECTIVE DATA Goal status: ongoing 07/04/2021   3.  Right Knee PROM 0* extension to 110* flexion Baseline: SEE OBJECTIVE DATA Goal status: ongoing 07/04/2021   4.  Right Knee AROM seated -3* extension to 100* flexion Baseline: SEE OBJECTIVE DATA Goal status: ongoing 07/04/2021   5.  Patient ambulates >500' community distances including negotiating ramps, curbs & stairs without device independently. Baseline: SEE OBJECTIVE DATA Goal status: ongoing 07/04/2021   6.  Patient reports ability to perform Yoga & walk her dogs. Baseline: SEE OBJECTIVE DATA Goal status: ongoing 07/04/2021   PLAN: PT FREQUENCY: 3x/week of manipulation, 4x/wk following week, 2-3xwk for 6 weeks   PT DURATION: 8 additional weeks   PLANNED INTERVENTIONS: Therapeutic exercises, Therapeutic activity, Neuromuscular re-education, Balance training, Gait training, Patient/Family education, Joint mobilization, Stair training,  Vestibular training, DME instructions, Electrical stimulation, Cryotherapy, Moist heat, scar mobilization, Taping, Vasopneumatic device, physical performance testing and Manual therapy   PLAN FOR NEXT SESSION:  Reinforce stretching to all muscles crossing the knee and hip flexors, Continue emphasis on AROM (extension), Increase tolerance to step negotiation with RLE  Elsie Ra, PT, DPT 07/31/21 4:09 PM

## 2021-08-01 ENCOUNTER — Ambulatory Visit: Payer: No Typology Code available for payment source | Admitting: Orthopedic Surgery

## 2021-08-01 ENCOUNTER — Encounter: Payer: Self-pay | Admitting: Physical Therapy

## 2021-08-01 ENCOUNTER — Ambulatory Visit (INDEPENDENT_AMBULATORY_CARE_PROVIDER_SITE_OTHER): Payer: No Typology Code available for payment source | Admitting: Physical Therapy

## 2021-08-01 DIAGNOSIS — M6281 Muscle weakness (generalized): Secondary | ICD-10-CM | POA: Diagnosis not present

## 2021-08-01 DIAGNOSIS — M25661 Stiffness of right knee, not elsewhere classified: Secondary | ICD-10-CM | POA: Diagnosis not present

## 2021-08-01 DIAGNOSIS — R600 Localized edema: Secondary | ICD-10-CM

## 2021-08-01 DIAGNOSIS — R2689 Other abnormalities of gait and mobility: Secondary | ICD-10-CM

## 2021-08-01 DIAGNOSIS — R2681 Unsteadiness on feet: Secondary | ICD-10-CM

## 2021-08-01 DIAGNOSIS — M25561 Pain in right knee: Secondary | ICD-10-CM

## 2021-08-01 NOTE — Therapy (Signed)
OUTPATIENT PHYSICAL THERAPY TREATMENT NOTE   Patient Name: Cheryl Tate MRN: [id number] DOB:11/25/1970, 51 y.o., female Today's Date: 08/01/2021   END OF SESSION:   PT End of Session - 08/01/21 1018     Visit Number 30    Number of Visits 36    Date for PT Re-Evaluation 08/31/21    Authorization Type [employer]    Authorization Time Period [employer] CHOICE  NO VISIT LIMIT, PATIENT PAYS 100% UNTIL DED IS MET, DED $7500 $1188 MET, TERM DATE 09/19/2021    Progress Note Due on Visit 38    PT Start Time 1016    PT Stop Time 1113    PT Time Calculation (min) 57 min    Activity Tolerance Patient limited by pain    Behavior During Therapy WFL for tasks assessed/performed                Past Medical History:  Diagnosis Date   Anemia    Depression    Menorrhagia    Mood disorder (HCC)    PONV (postoperative nausea and vomiting)    Von Willebrand's disease (HCC)    followed by hemotologist Dr. Ennever   Past Surgical History:  Procedure Laterality Date   APPENDECTOMY  01/22/2004   DILATION AND CURETTAGE OF UTERUS  2002   x 2 in 2002   DILITATION & CURRETTAGE/HYSTROSCOPY WITH HYDROTHERMAL ABLATION N/A 11/22/2014   Procedure: HYSTEROSCOPY WITH HYDROTHERMAL ABLATION;  Surgeon: Michelle Grewal, MD;  Location: [facility];  Service: Gynecology;  Laterality: N/A;   EXAM UNDER ANESTHESIA WITH MANIPULATION OF KNEE Right 07/12/2021   Procedure: RIGHT EXAM UNDER ANESTHESIA WITH MANIPULATION OF KNEE;  Surgeon: Dean, Gregory Scott, MD;  Location: [facility] OR;  Service: Orthopedics;  Laterality: Right;   HYSTEROSCOPY WITH D & C  08-08-2014  in [place]   MULTIPLE TOOTH EXTRACTIONS     TOTAL KNEE ARTHROPLASTY Right 05/22/2021   Procedure: RIGHT TOTAL KNEE ARTHROPLASTY;  Surgeon: Dean, Gregory Scott, MD;  Location: [facility] OR;  Service: Orthopedics;  Laterality: Right;   Patient Active Problem List   Diagnosis Date Noted   Fibrosis of right knee joint    Arthritis of right knee    S/P knee  replacement 05/22/2021   BMI 33.0-33.9,adult 06/16/2015   Von Willebrand disease (HCC) 06/13/2015   Depression 12/29/2014   Rhinitis, allergic 12/28/2014    PCP: Michael Hilts, MD   REFERRING PROVIDER: Magnant, Charles L, PA-C   REFERRING DIAG: 6.659 (ICD-10-CM) - History of total knee arthroplasty, unspecified laterality  ONSET DATE: 05/22/2021 Right TKA   THERAPY DIAG:  Stiffness of right knee, not elsewhere classified  Muscle weakness (generalized)  Acute pain of right knee  Other abnormalities of gait and mobility  Localized edema  Unsteadiness on feet  PERTINENT HISTORY: Von Willebrand disease, depression, Menorrhgia  PRECAUTIONS: None  SUBJECTIVE:  She took Ketoprofen - a pain med and anti-inflammatory - before arriving to clinic with adult strength levels. Pain overnight woke her up from sleep.  PAIN:  NPRS scale: 1.5-2/10 entering clinic, highest was a 6-7/10 over night Pain location: right knee all over.  Pain description: sharp, sore, cramps Aggravating factors: fall Relieving factors: ice, meds   OBJECTIVE:    PATIENT SURVEYS:  05/28/2021 FOTO 29% functional & target 67%   COGNITION: 05/28/2021 Overall cognitive status: Within functional limits for tasks assessed                EDEMA: 05/28/2021 RLE above knee 51.8cm around knee   48cm below knee  41.2cm LLE above knee 45.8cm around knee 40cm below knee 34.1cm   PALPATION: 05/28/2021 Bruising along medial knee & lower leg, tenderness over joint line, quads, hamstrings   LE ROM:   ROM P: Passive  A: Active Right 05/28/21 Right 05/31/21 Right 06/05/21 Right 06/06/21 Right 06/11/21 Right 06/14/21 Right 06/19/21 Right 06/26/21 Right 06/28/21 Right 07/02/21 Right 07/13/21 Right 07/19/21 Right 07/20/21 Right 07/23/2021 Right 07/27/21 Right 07/30/21  Hip flexion                   Hip extension                   Hip abduction                   Hip adduction                   Hip internal rotation                    Hip external rotation                   Knee flexion Supine P: 64* Seated  A: 64* Supine AROM heel slide:61 PROM overpressure in heel slide : 66   Supine AA: 73* Seated  P: 75*  Seated P: 79* Seated P: 75 Seated P: 70 with muscle guarding limiting Seated P:78 after manual therapy Seated P: 75*  A: 64* after manual therapy Supine A 70 P 78 AA 84 (after exercise) Seated P:85 After maual therapy Seated P:93 after manual therapy Seated P: 97*  Seated P: 98*  Knee extension Supine P: -10* Seated  A: -31* Seated AROM LAQ: -21 Seated LAQ A: -14* Supine P: -9* Supine A: -8 Supine P: -7* Supine A: -8 P:-6 Supine A:-8 P:-5  Seated P: -7* after manual therapy Seated  A -16 (seated LAQ)   Supine P: -9* Supine  P:-9 after manual therapy Supine P: -8* after manual  Ankle dorsiflexion                   Ankle plantarflexion                   Ankle inversion                   Ankle eversion                    (Blank rows = not tested)   LE MMT:   MMT Right 05/28/2021 Left 05/28/2021  Hip flexion      Hip extension      Hip abduction      Hip adduction      Hip internal rotation      Hip external rotation      Knee flexion 2/5   Knee extension 2/5   Ankle dorsiflexion      Ankle plantarflexion      Ankle inversion      Ankle eversion       (Blank rows = not tested)   GAIT: 05/28/2021 Distance walked: 75' Assistive device utilized:  4 footed walker level of assistance: SBA  verbal cues needed Comments: antalgic, right knee flexed in stance, limited to no increase flexion for swing, RLE abducted,    Functional Activities: Pt using LLE to move RLE in & out of bed.  PT min guard RLE and she was able to move RLE without   assisting with LLE.    TODAY'S TREATMENT: 08/01/2021 Therapeutic Exercise: Sci[employer] Seat 6 AAROM rocking 3 min, cycling 4 min with BUEs/BLEs level 1, 1 min without BUEs Standing gastroc stretch with heel depression with verbal and tactile cues for  placement and weight distribution, 2x 30s holds Seated LAQ and active knee flexion with contralateral active knee motion x 20 reps  Therapeutic Activity: RLE step up and down on 6" step in //bars PT demo and verbal cues for foot placement, knee flexion/extension, and weight shifting - x6 with BUE support on bars, x2 with LUE support on bars, x6 with no UE support  Pt amb in clinic without device safely with no knee instability or balance noted. She does continue to have knee flexed in stance & decreased stance duration on RLE. PT educated on walking without cane at home and returning to cane if limping more or pain is increasing, pt verbalized understanding Working towards pt goal to return to Yoga:  Modified downward dog on chair 1x 30 sec with PT demo and verbal cueing Warrior pose 2x15 sec with RLE in back, 1x15 sec with LLE in back - verbal cues for leg placement and straightening and CGA.   Manual Therapy:  Standing knee extension mobs with belt and gastroc stretch 2x10 reps Supine knee extension and flexion PROM x 8 min  Vaso R knee 10 minutes High Pressure 34* post-treatment with knee extension prop  07/31/2021 Therapeutic Exercise: Sci[employer] Seat 6 AAROM rocking 2.5 min, Seat 7 cycling 5.5 min Seated hamstring stretch 30 sec X3 on Rt Seated heelslides AAROM 10 sec X 10 on Rt Seated knee extension stretch heel prop in separate chair 10# X 5 min  Manual Therapy: Supine knee extension and flexion PROM with overpressure to tolerance Supine extension mobs grade 4-5  Supine A-P and P-A femoral-tib mobs grade 3-4 Manual gastroc/hamstring stretching  Vaso R knee 10 minutes High Pressure 34* post-treatment with knee extension prop  07/30/2021 Therapeutic Exercise: SciFit Seat 6 AAROM rocking 2.5 min, Seat 8 cycling 5 min, Seat 7 cycling 1 min Standing gastroc stretch with heel depression with verbal and tactile cues for placement and weight distribution, 2x 30s holds Seated hamstring  stretch with strap with verbal and tactile cueing for positioning and rationale, 2x 30s holds Supine hamstring stretch with strap and PT manual assist into knee extension 1x 30s  PT reviewed heel prop for ext and pt verbalized better understanding.   Therapeutic Activity: RLE step up and down on 6" step in //bars x10, PT demo and verbal cues for foot placement, knee flexion/extension, and weight shifting  Manual Therapy: Standing knee extension mobs with belt and gastroc stretch 5x10 Supine knee extension PROM x1 min  Vaso R knee 10 minutes High Pressure 34* post-treatment with knee extension prop  HOME EXERCISE PROGRAM: Access Code: [email or web address] URL: [email or web address] Date: 06/19/2021 Prepared by: Brian Nelson  Exercises - Quad Setting and Stretching  - 2-4 x daily - 7 x weekly - 5-10 sets - 10 reps - prop 5-10 minutes & quad set5 seconds hold - Supine Heel Slide with Strap  - 2-3 x daily - 7 x weekly - 2-3 sets - 10 reps - 5 seconds hold - Seated Knee Flexion Stretch  - 2 x daily - 6 x weekly - 1-2 sets - 10 reps - 5 sec hold - Seated straight leg lifts  - 2-3 x daily - 7 x weekly - 2-3 sets - 10 reps - 5 seconds hold -   Seated Hamstring Stretch with Strap  - 2-4 x daily - 7 x weekly - 1 sets - 3 reps - 20-30 seconds hold - Sit to Stand with Counter Support  - 2 x daily - 6 x weekly - 1-2 sets - 10 reps   ASSESSMENT:   CLINICAL IMPRESSION:  She arrived feeling well after taking a new pain/anti-inflammatory med so the step in the parallel bars was progressed to 1 hand rail then no hand rails, showing decreased eccentric quad control and decreased knee extension in stance. She feels comfortable walking without the cane at home and using cane when feeling sore or walking longer distances. She asked about doing yoga and was shown a modified downward dog and a safe way to practice standing poses at home, and yoga positions could be added to treatment options in the plan of  care.  OBJECTIVE IMPAIRMENTS Abnormal gait, decreased activity tolerance, decreased balance, decreased knowledge of condition, decreased knowledge of use of DME, decreased mobility, difficulty walking, decreased ROM, decreased strength, increased edema, increased muscle spasms, impaired flexibility, postural dysfunction, and pain.    ACTIVITY LIMITATIONS community activity, driving, and personal recreation like Yoga & walking dogs .    PERSONAL FACTORS 1-2 comorbidities: see PMH  are also affecting patient's functional outcome.    REHAB POTENTIAL: Good   CLINICAL DECISION MAKING: Stable/uncomplicated   EVALUATION COMPLEXITY: Low     GOALS: Goals reviewed with patient? Yes   SHORT TERM GOALS: Target date: 06/22/2021   Patient independent and verbalizes compliance with initial HEP Baseline: SEE OBJECTIVE DATA Goal status: Met 07/26/2021   2.  Patient reports 50% improvement in right knee pain. Baseline: SEE OBJECTIVE DATA Goal status: on going 07/26/2021   3.  PROM right knee extension -3* to flexion 90* Baseline: SEE OBJECTIVE DATA Goal status: on going 05/29/2021   LONG TERM GOALS: Target date: 08/31/2021   Patient will improve FOTO score to 67% Baseline: SEE OBJECTIVE DATA Goal status: ongoing 07/04/2021   2.  Patient reports right knee pain </= 2/10 with standing & gait activities.  Baseline: SEE OBJECTIVE DATA Goal status: ongoing 07/04/2021   3.  Right Knee PROM 0* extension to 110* flexion Baseline: SEE OBJECTIVE DATA Goal status: ongoing 07/04/2021   4.  Right Knee AROM seated -3* extension to 100* flexion Baseline: SEE OBJECTIVE DATA Goal status: ongoing 07/04/2021   5.  Patient ambulates >500' community distances including negotiating ramps, curbs & stairs without device independently. Baseline: SEE OBJECTIVE DATA Goal status: ongoing 07/04/2021   6.  Patient reports ability to perform Yoga & walk her dogs. Baseline: SEE OBJECTIVE DATA Goal status: ongoing  07/04/2021   PLAN: PT FREQUENCY: 3x/week of manipulation, 4x/wk following week, 2-3xwk for 6 weeks   PT DURATION: 8 additional weeks   PLANNED INTERVENTIONS: Therapeutic exercises, Therapeutic activity, Neuromuscular re-education, Balance training, Gait training, Patient/Family education, Joint mobilization, Stair training, Vestibular training, DME instructions, Electrical stimulation, Cryotherapy, Moist heat, scar mobilization, Taping, Vasopneumatic device, physical performance testing and Manual therapy   PLAN FOR NEXT SESSION:  Reinforce stretching to all muscles crossing the knee and hip flexors, Continue emphasis on PROM & AROM for ext & flex, Increase tolerance to step negotiation with RLE with eccentric quad control  Kayla Berezne, SPT 08/01/2021, 1:23 PM  This entire session of physical therapy was performed under the direct supervision of PT signing evaluation /treatment. PT reviewed note and agrees.  Robin Waldron, PT, DPT 08/01/21 4:52 PM 

## 2021-08-02 ENCOUNTER — Encounter: Payer: Self-pay | Admitting: Physical Therapy

## 2021-08-02 ENCOUNTER — Ambulatory Visit (INDEPENDENT_AMBULATORY_CARE_PROVIDER_SITE_OTHER): Payer: No Typology Code available for payment source | Admitting: Physical Therapy

## 2021-08-02 DIAGNOSIS — R2689 Other abnormalities of gait and mobility: Secondary | ICD-10-CM

## 2021-08-02 DIAGNOSIS — M25661 Stiffness of right knee, not elsewhere classified: Secondary | ICD-10-CM

## 2021-08-02 DIAGNOSIS — M6281 Muscle weakness (generalized): Secondary | ICD-10-CM | POA: Diagnosis not present

## 2021-08-02 DIAGNOSIS — R600 Localized edema: Secondary | ICD-10-CM

## 2021-08-02 DIAGNOSIS — M25561 Pain in right knee: Secondary | ICD-10-CM

## 2021-08-02 NOTE — Therapy (Signed)
OUTPATIENT PHYSICAL THERAPY TREATMENT NOTE   Patient Name: Cheryl Tate MRN: [id number] DOB:[birth year], 51 y.o., female Today's Date: 08/02/2021   END OF SESSION:   PT End of Session - 08/02/21 1604     Visit Number 31    Number of Visits 36    Date for PT Re-Evaluation 08/31/21    Authorization Type [employer]    Authorization Time Period [employer] CHOICE  NO VISIT LIMIT, PATIENT PAYS 100% UNTIL DED IS MET, DED $7500 [id number] MET, TERM DATE 09/19/2021    Progress Note Due on Visit 30    PT Start Time 1600    PT Stop Time 1650    PT Time Calculation (min) 50 min    Activity Tolerance Patient limited by pain    Behavior During Therapy WFL for tasks assessed/performed                Past Medical History:  Diagnosis Date   Anemia    Depression    Menorrhagia    Mood disorder (HCC)    PONV (postoperative nausea and vomiting)    Von Willebrand's disease ([place])    followed by hemotologist Dr. Marin Olp   Past Surgical History:  Procedure Laterality Date   APPENDECTOMY  01/22/2004   DILATION AND CURETTAGE OF UTERUS  2002   x 2 in 2002   DILITATION & CURRETTAGE/HYSTROSCOPY WITH HYDROTHERMAL ABLATION N/A 11/22/2014   Procedure: HYSTEROSCOPY WITH HYDROTHERMAL ABLATION;  Surgeon: Dian Queen, MD;  Location: [place];  Service: Gynecology;  Laterality: N/A;   EXAM UNDER ANESTHESIA WITH MANIPULATION OF KNEE Right 07/12/2021   Procedure: RIGHT EXAM UNDER ANESTHESIA WITH MANIPULATION OF KNEE;  Surgeon: Meredith Pel, MD;  Location: [place];  Service: Orthopedics;  Laterality: Right;   HYSTEROSCOPY WITH D & C  08-08-2014  in [place]   MULTIPLE TOOTH EXTRACTIONS     TOTAL KNEE ARTHROPLASTY Right 05/22/2021   Procedure: RIGHT TOTAL KNEE ARTHROPLASTY;  Surgeon: Meredith Pel, MD;  Location: [place];  Service: Orthopedics;  Laterality: Right;   Patient Active Problem List   Diagnosis Date Noted   Fibrosis of right knee joint    Arthritis of right knee    S/P knee  replacement 05/22/2021   BMI 33.0-33.9,adult 06/16/2015   Von Willebrand disease ([place]) 06/13/2015   Depression 12/29/2014   Rhinitis, allergic 12/28/2014    PCP: Eunice Blase, MD   REFERRING PROVIDER: Donella Stade, PA-C   REFERRING DIAG: [phone] (ICD-10-CM) - History of total knee arthroplasty, unspecified laterality  ONSET DATE: 05/22/2021 Right TKA   THERAPY DIAG:  Stiffness of right knee, not elsewhere classified  Muscle weakness (generalized)  Acute pain of right knee  Other abnormalities of gait and mobility  Localized edema  PERTINENT HISTORY: Von Willebrand disease, depression, Menorrhgia  PRECAUTIONS: None  SUBJECTIVE:  She still has a lot of pain at night but it is getting better during the day. She did tweak her back however.  PAIN:  NPRS scale: 1.5-2/10 entering clinic, highest was a 6-7/10 over night Pain location: right knee all over.  Pain description: sharp, sore, cramps Aggravating factors: fall Relieving factors: ice, meds   OBJECTIVE:    PATIENT SURVEYS:  05/28/2021 FOTO 29% functional & target 67%   COGNITION: 05/28/2021 Overall cognitive status: Within functional limits for tasks assessed                EDEMA: 05/28/2021 RLE above knee 51.8cm around knee 48cm below knee  41.2cm LLE  above knee 45.8cm around knee 40cm below knee 34.1cm   PALPATION: 05/28/2021 Bruising along medial knee & lower leg, tenderness over joint line, quads, hamstrings   LE ROM:   ROM P: Passive  A: Active Right 05/28/21 Right 05/31/21 Right 06/05/21 Right 06/06/21 Right 06/11/21 Right 06/14/21 Right 06/19/21 Right 06/26/21 Right 06/28/21 Right 07/02/21 Right 07/13/21 Right 07/19/21 Right 07/20/21 Right 07/23/2021 Right 07/27/21 Right 07/30/21  Hip flexion                   Hip extension                   Hip abduction                   Hip adduction                   Hip internal rotation                   Hip external rotation                   Knee flexion Supine P:  64* Seated  A: 64* Supine AROM heel slide:61 PROM overpressure in heel slide : 66   Supine AA: 73* Seated  P: 75*  Seated P: 79* Seated P: 75 Seated P: 70 with muscle guarding limiting Seated P:78 after manual therapy Seated P: 75*  A: 64* after manual therapy Supine A 70 P 78 AA 84 (after exercise) Seated P:85 After maual therapy Seated P:93 after manual therapy Seated P: 97*  Seated P: 98*  Knee extension Supine P: -10* Seated  A: -31* Seated AROM LAQ: -21 Seated LAQ A: -14* Supine P: -9* Supine A: -8 Supine P: -7* Supine A: -8 P:-6 Supine A:-8 P:-5  Seated P: -7* after manual therapy Seated  A -16 (seated LAQ)   Supine P: -9* Supine  P:-9 after manual therapy Supine P: -8* after manual  Ankle dorsiflexion                   Ankle plantarflexion                   Ankle inversion                   Ankle eversion                    (Blank rows = not tested)   LE MMT:   MMT Right 05/28/2021 Left 05/28/2021  Hip flexion      Hip extension      Hip abduction      Hip adduction      Hip internal rotation      Hip external rotation      Knee flexion 2/5   Knee extension 2/5   Ankle dorsiflexion      Ankle plantarflexion      Ankle inversion      Ankle eversion       (Blank rows = not tested)   GAIT: 05/28/2021 Distance walked: 80' Assistive device utilized:  4 footed walker level of assistance: SBA  verbal cues needed Comments: antalgic, right knee flexed in stance, limited to no increase flexion for swing, RLE abducted,    Functional Activities: Pt using LLE to move RLE in & out of bed.  PT min guard RLE and she was able to move RLE without assisting with LLE.  TODAY'S TREATMENT: 08/02/21 -Sci fit bike seat 7 full revolutions X 8 min -Leg press DL 75# moving into max flexion and holding 5 seconds then max extension and holding 5 sec 3X10 -March walking at counter top 3 round trips -tandem walking at counter top 3 round trips -6 inch step fwd step  up with Rt and down in front with left, bilat UE support PRN X 15 -Lunge stretch for knee flexion 10 sec X5 with foot on 6 inch step. -heel prop X 5 min for extension stretch  -Supine knee extension and flexion PROM x 10 min  -Vaso R knee 10 minutes High Pressure 34* post-treatment with knee extension prop   08/01/2021 Therapeutic Exercise: SciFit Seat 6 AAROM rocking 3 min, cycling 4 min with BUEs/BLEs level 1, 1 min without BUEs Standing gastroc stretch with heel depression with verbal and tactile cues for placement and weight distribution, 2x 30s holds Seated LAQ and active knee flexion with contralateral active knee motion x 20 reps  Therapeutic Activity: RLE step up and down on 6" step in //bars PT demo and verbal cues for foot placement, knee flexion/extension, and weight shifting - x6 with BUE support on bars, x2 with LUE support on bars, x6 with no UE support  Pt amb in clinic without device safely with no knee instability or balance noted. She does continue to have knee flexed in stance & decreased stance duration on RLE. PT educated on walking without cane at home and returning to cane if limping more or pain is increasing, pt verbalized understanding Working towards pt goal to return to Yoga:  Modified downward dog on chair 1x 30 sec with PT demo and verbal cueing Warrior pose 2x15 sec with RLE in back, 1x15 sec with LLE in back - verbal cues for leg placement and straightening and CGA.   Manual Therapy:  Standing knee extension mobs with belt and gastroc stretch 2x10 reps Supine knee extension and flexion PROM x 8 min  Vaso R knee 10 minutes High Pressure 34* post-treatment with knee extension prop  07/31/2021 Therapeutic Exercise: SciFit Seat 6 AAROM rocking 2.5 min, Seat 7 cycling 5.5 min Seated hamstring stretch 30 sec X3 on Rt Seated heelslides AAROM 10 sec X 10 on Rt Seated knee extension stretch heel prop in separate chair 10# X 5 min  Manual Therapy: Supine knee  extension and flexion PROM with overpressure to tolerance Supine extension mobs grade 4-5  Supine A-P and P-A femoral-tib mobs grade 3-4 Manual gastroc/hamstring stretching  Vaso R knee 10 minutes High Pressure 34* post-treatment with knee extension prop  07/30/2021 Therapeutic Exercise: SciFit Seat 6 AAROM rocking 2.5 min, Seat 8 cycling 5 min, Seat 7 cycling 1 min Standing gastroc stretch with heel depression with verbal and tactile cues for placement and weight distribution, 2x 30s holds Seated hamstring stretch with strap with verbal and tactile cueing for positioning and rationale, 2x 30s holds Supine hamstring stretch with strap and PT manual assist into knee extension 1x 30s  PT reviewed heel prop for ext and pt verbalized better understanding.   Therapeutic Activity: RLE step up and down on 6" step in //bars x10, PT demo and verbal cues for foot placement, knee flexion/extension, and weight shifting  Manual Therapy: Standing knee extension mobs with belt and gastroc stretch 5x10 Supine knee extension PROM x1 min  Vaso R knee 10 minutes High Pressure 34* post-treatment with knee extension prop  HOME EXERCISE PROGRAM: Access Code: [email or web address] URL: [email or web address] Date: 06/19/2021  Prepared by: Elsie Ra  Exercises - Quad Setting and Stretching  - 2-4 x daily - 7 x weekly - 5-10 sets - 10 reps - prop 5-10 minutes & quad set5 seconds hold - Supine Heel Slide with Strap  - 2-3 x daily - 7 x weekly - 2-3 sets - 10 reps - 5 seconds hold - Seated Knee Flexion Stretch  - 2 x daily - 6 x weekly - 1-2 sets - 10 reps - 5 sec hold - Seated straight leg lifts  - 2-3 x daily - 7 x weekly - 2-3 sets - 10 reps - 5 seconds hold - Seated Hamstring Stretch with Strap  - 2-4 x daily - 7 x weekly - 1 sets - 3 reps - 20-30 seconds hold - Sit to Stand with Counter Support  - 2 x daily - 6 x weekly - 1-2 sets - 10 reps   ASSESSMENT:   CLINICAL IMPRESSION:  She is improving  with gait but still struggles with knee flexion ROM which he continue to work to improve as tolerated in PT.  OBJECTIVE IMPAIRMENTS Abnormal gait, decreased activity tolerance, decreased balance, decreased knowledge of condition, decreased knowledge of use of DME, decreased mobility, difficulty walking, decreased ROM, decreased strength, increased edema, increased muscle spasms, impaired flexibility, postural dysfunction, and pain.    ACTIVITY LIMITATIONS community activity, driving, and personal recreation like Yoga & walking dogs .    PERSONAL FACTORS 1-2 comorbidities: see PMH  are also affecting patient's functional outcome.    REHAB POTENTIAL: Good   CLINICAL DECISION MAKING: Stable/uncomplicated   EVALUATION COMPLEXITY: Low     GOALS: Goals reviewed with patient? Yes   SHORT TERM GOALS: Target date: 06/22/2021   Patient independent and verbalizes compliance with initial HEP Baseline: SEE OBJECTIVE DATA Goal status: Met 07/26/2021   2.  Patient reports 50% improvement in right knee pain. Baseline: SEE OBJECTIVE DATA Goal status: on going 07/26/2021   3.  PROM right knee extension -3* to flexion 90* Baseline: SEE OBJECTIVE DATA Goal status: on going 05/29/2021   LONG TERM GOALS: Target date: 08/31/2021   Patient will improve FOTO score to 67% Baseline: SEE OBJECTIVE DATA Goal status: ongoing 07/04/2021   2.  Patient reports right knee pain </= 2/10 with standing & gait activities.  Baseline: SEE OBJECTIVE DATA Goal status: ongoing 07/04/2021   3.  Right Knee PROM 0* extension to 110* flexion Baseline: SEE OBJECTIVE DATA Goal status: ongoing 07/04/2021   4.  Right Knee AROM seated -3* extension to 100* flexion Baseline: SEE OBJECTIVE DATA Goal status: ongoing 07/04/2021   5.  Patient ambulates >500' community distances including negotiating ramps, curbs & stairs without device independently. Baseline: SEE OBJECTIVE DATA Goal status: ongoing 07/04/2021   6.  Patient reports  ability to perform Yoga & walk her dogs. Baseline: SEE OBJECTIVE DATA Goal status: ongoing 07/04/2021   PLAN: PT FREQUENCY: 3x/week of manipulation, 4x/wk following week, 2-3xwk for 6 weeks   PT DURATION: 8 additional weeks   PLANNED INTERVENTIONS: Therapeutic exercises, Therapeutic activity, Neuromuscular re-education, Balance training, Gait training, Patient/Family education, Joint mobilization, Stair training, Vestibular training, DME instructions, Electrical stimulation, Cryotherapy, Moist heat, scar mobilization, Taping, Vasopneumatic device, physical performance testing and Manual therapy   PLAN FOR NEXT SESSION:  Reinforce stretching to all muscles crossing the knee and hip flexors, Continue emphasis on PROM & AROM for ext & flex, Increase tolerance to step negotiation with RLE with eccentric quad control  Elsie Ra, PT, DPT  08/02/21 4:46 PM

## 2021-08-03 ENCOUNTER — Encounter: Payer: Self-pay | Admitting: Rehabilitative and Restorative Service Providers"

## 2021-08-03 ENCOUNTER — Ambulatory Visit (INDEPENDENT_AMBULATORY_CARE_PROVIDER_SITE_OTHER): Payer: No Typology Code available for payment source | Admitting: Rehabilitative and Restorative Service Providers"

## 2021-08-03 DIAGNOSIS — R2689 Other abnormalities of gait and mobility: Secondary | ICD-10-CM

## 2021-08-03 DIAGNOSIS — R600 Localized edema: Secondary | ICD-10-CM

## 2021-08-03 DIAGNOSIS — M25561 Pain in right knee: Secondary | ICD-10-CM | POA: Diagnosis not present

## 2021-08-03 DIAGNOSIS — R2681 Unsteadiness on feet: Secondary | ICD-10-CM

## 2021-08-03 DIAGNOSIS — M25661 Stiffness of right knee, not elsewhere classified: Secondary | ICD-10-CM | POA: Diagnosis not present

## 2021-08-03 DIAGNOSIS — M6281 Muscle weakness (generalized): Secondary | ICD-10-CM

## 2021-08-03 NOTE — Therapy (Signed)
OUTPATIENT PHYSICAL THERAPY TREATMENT NOTE   Patient Name: Cheryl Tate MRN: [id number] DOB:May 22, 1970, 51 y.o., female Today's Date: 08/03/2021   END OF SESSION:   PT End of Session - 08/03/21 1003     Visit Number 32    Number of Visits 36    Date for PT Re-Evaluation 08/31/21    Authorization Type U[employer]    Authorization Time Period [facility] CHOICE  NO VISIT LIMIT, PATIENT PAYS 100% UNTIL DED IS MET, DED $7500 [id number] MET, TERM DATE 09/19/2021    Progress Note Due on Visit 70    PT Start Time 0928    PT Stop Time 1018    PT Time Calculation (min) 50 min    Activity Tolerance Patient limited by pain    Behavior During Therapy [facility] for tasks assessed/performed                 Past Medical History:  Diagnosis Date   Anemia    Depression    Menorrhagia    Mood disorder (HCC)    PONV (postoperative nausea and vomiting)    Von Willebrand's disease ([place])    followed by hemotologist Dr. Marin Olp   Past Surgical History:  Procedure Laterality Date   APPENDECTOMY  01/22/2004   DILATION AND CURETTAGE OF UTERUS  2002   x 2 in 2002   DILITATION & CURRETTAGE/HYSTROSCOPY WITH HYDROTHERMAL ABLATION N/A 11/22/2014   Procedure: HYSTEROSCOPY WITH HYDROTHERMAL ABLATION;  Surgeon: Dian Queen, MD;  Location: [place];  Service: Gynecology;  Laterality: N/A;   EXAM UNDER ANESTHESIA WITH MANIPULATION OF KNEE Right 07/12/2021   Procedure: RIGHT EXAM UNDER ANESTHESIA WITH MANIPULATION OF KNEE;  Surgeon: Meredith Pel, MD;  Location: [place];  Service: Orthopedics;  Laterality: Right;   HYSTEROSCOPY WITH D & C  08-08-2014  in [place]   MULTIPLE TOOTH EXTRACTIONS     TOTAL KNEE ARTHROPLASTY Right 05/22/2021   Procedure: RIGHT TOTAL KNEE ARTHROPLASTY;  Surgeon: Meredith Pel, MD;  Location: [place];  Service: Orthopedics;  Laterality: Right;   Patient Active Problem List   Diagnosis Date Noted   Fibrosis of right knee joint    Arthritis of right knee    S/P  knee replacement 05/22/2021   BMI 33.0-33.9,adult 06/16/2015   Von Willebrand disease ([place]) 06/13/2015   Depression 12/29/2014   Rhinitis, allergic 12/28/2014    PCP: Eunice Blase, MD   REFERRING PROVIDER: Donella Stade, PA-C   REFERRING DIAG: [phone] (ICD-10-CM) - History of total knee arthroplasty, unspecified laterality  ONSET DATE: 05/22/2021 Right TKA   THERAPY DIAG:  Stiffness of right knee, not elsewhere classified  Muscle weakness (generalized)  Acute pain of right knee  Other abnormalities of gait and mobility  Localized edema  Unsteadiness on feet  PERTINENT HISTORY: Von Willebrand disease, depression, Menorrhgia  PRECAUTIONS: None  SUBJECTIVE:  Pt indicated having increased stiffness and noted pain at 4-5/10.  Took medicine that helped the symptoms.   PAIN:  NPRS scale: 4-5/10 this morning  Pain location: right knee all over.  Pain description: sharp, sore, cramps Aggravating factors: weather, stiffness in morning Relieving factors: ice, meds   OBJECTIVE:    PATIENT SURVEYS:  05/28/2021 FOTO 29% functional & target 67%   COGNITION: 05/28/2021 Overall cognitive status: Within functional limits for tasks assessed                EDEMA: 05/28/2021 RLE above knee 51.8cm around knee 48cm below knee  41.2cm LLE above knee  45.8cm around knee 40cm below knee 34.1cm   PALPATION: 05/28/2021 Bruising along medial knee & lower leg, tenderness over joint line, quads, hamstrings   LE ROM:   ROM P: Passive  A: Active Right 05/28/21 Right 05/31/21 Right 06/05/21 Right 06/06/21 Right 06/11/21 Right 06/14/21 Right 06/19/21 Right 06/26/21 Right 06/28/21 Right 07/02/21 Right 07/13/21 Right 07/19/21 Right 07/20/21 Right 07/23/2021 Right 07/27/21 Right 07/30/21  Hip flexion                   Hip extension                   Hip abduction                   Hip adduction                   Hip internal rotation                   Hip external rotation                   Knee  flexion Supine P: 64* Seated  A: 64* Supine AROM heel slide:61 PROM overpressure in heel slide : 66   Supine AA: 73* Seated  P: 75*  Seated P: 79* Seated P: 75 Seated P: 70 with muscle guarding limiting Seated P:78 after manual therapy Seated P: 75*  A: 64* after manual therapy Supine A 70 P 78 AA 84 (after exercise) Seated P:85 After maual therapy Seated P:93 after manual therapy Seated P: 97*  Seated P: 98*  Knee extension Supine P: -10* Seated  A: -31* Seated AROM LAQ: -21 Seated LAQ A: -14* Supine P: -9* Supine A: -8 Supine P: -7* Supine A: -8 P:-6 Supine A:-8 P:-5  Seated P: -7* after manual therapy Seated  A -16 (seated LAQ)   Supine P: -9* Supine  P:-9 after manual therapy Supine P: -8* after manual  Ankle dorsiflexion                   Ankle plantarflexion                   Ankle inversion                   Ankle eversion                    (Blank rows = not tested)   LE MMT:   MMT Right 05/28/2021 Left 05/28/2021 Right 08/03/2021  Hip flexion       Hip extension       Hip abduction       Hip adduction       Hip internal rotation       Hip external rotation       Knee flexion 2/5  4/5  Knee extension 2/5    Ankle dorsiflexion       Ankle plantarflexion       Ankle inversion       Ankle eversion        (Blank rows = not tested)   GAIT: 08/03/2021  SPC use into clinic, reduced extension throughout gait cycle.   05/28/2021 Distance walked: 63' Assistive device utilized:  4 footed walker level of assistance: SBA  verbal cues needed Comments: antalgic, right knee flexed in stance, limited to no increase flexion for swing, RLE abducted,    Functional Activities: Pt using LLE to move  RLE in & out of bed.  PT min guard RLE and she was able to move RLE without assisting with LLE.    TODAY'S TREATMENT: 08/03/21 Therex -Sci fit bike seat 6-7 full revolutions X 8 min -Leg press DL 75# moving into max flexion and holding 5 seconds then max extension  and holding 5 sec 2 x 15 -Runner stretch 15 sec x 5 c Rt leg posterior -heel off step 15 sec x1 Rt Prone hang 2 mins prior to manual -heel prop X 5 min for extension stretch  Manual Prone extension stretching c overpressure, percussive device to hamstring in extension stretching.  Contact/relax to hamstring for extension.  Supine extension c ER mobilization c movement.    -Vaso R knee 10 minutes High Pressure 34* post-treatment with knee extension prop  08/02/21 -Sci fit bike seat 7 full revolutions X 8 min -Leg press DL 75# moving into max flexion and holding 5 seconds then max extension and holding 5 sec 3X10 -March walking at counter top 3 round trips -tandem walking at counter top 3 round trips -6 inch step fwd step up with Rt and down in front with left, bilat UE support PRN X 15 -Lunge stretch for knee flexion 10 sec X5 with foot on 6 inch step. -heel prop X 5 min for extension stretch  -Supine knee extension and flexion PROM x 10 min  -Vaso R knee 10 minutes High Pressure 34* post-treatment with knee extension prop   08/01/2021 Therapeutic Exercise: SciFit Seat 6 AAROM rocking 3 min, cycling 4 min with BUEs/BLEs level 1, 1 min without BUEs Standing gastroc stretch with heel depression with verbal and tactile cues for placement and weight distribution, 2x 30s holds Seated LAQ and active knee flexion with contralateral active knee motion x 20 reps  Therapeutic Activity: RLE step up and down on 6" step in //bars PT demo and verbal cues for foot placement, knee flexion/extension, and weight shifting - x6 with BUE support on bars, x2 with LUE support on bars, x6 with no UE support  Pt amb in clinic without device safely with no knee instability or balance noted. She does continue to have knee flexed in stance & decreased stance duration on RLE. PT educated on walking without cane at home and returning to cane if limping more or pain is increasing, pt verbalized understanding Working  towards pt goal to return to Yoga:  Modified downward dog on chair 1x 30 sec with PT demo and verbal cueing Warrior pose 2x15 sec with RLE in back, 1x15 sec with LLE in back - verbal cues for leg placement and straightening and CGA.   Manual Therapy:  Standing knee extension mobs with belt and gastroc stretch 2x10 reps Supine knee extension and flexion PROM x 8 min  Vaso R knee 10 minutes High Pressure 34* post-treatment with knee extension prop  07/31/2021 Therapeutic Exercise: SciFit Seat 6 AAROM rocking 2.5 min, Seat 7 cycling 5.5 min Seated hamstring stretch 30 sec X3 on Rt Seated heelslides AAROM 10 sec X 10 on Rt Seated knee extension stretch heel prop in separate chair 10# X 5 min  Manual Therapy: Supine knee extension and flexion PROM with overpressure to tolerance Supine extension mobs grade 4-5  Supine A-P and P-A femoral-tib mobs grade 3-4 Manual gastroc/hamstring stretching  Vaso R knee 10 minutes High Pressure 34* post-treatment with knee extension prop   HOME EXERCISE PROGRAM: Access Code: [email or web address] URL: [email or web address] Date: 06/19/2021 Prepared by: Elsie Ra  Exercises -  Quad Setting and Stretching  - 2-4 x daily - 7 x weekly - 5-10 sets - 10 reps - prop 5-10 minutes & quad set5 seconds hold - Supine Heel Slide with Strap  - 2-3 x daily - 7 x weekly - 2-3 sets - 10 reps - 5 seconds hold - Seated Knee Flexion Stretch  - 2 x daily - 6 x weekly - 1-2 sets - 10 reps - 5 sec hold - Seated straight leg lifts  - 2-3 x daily - 7 x weekly - 2-3 sets - 10 reps - 5 seconds hold - Seated Hamstring Stretch with Strap  - 2-4 x daily - 7 x weekly - 1 sets - 3 reps - 20-30 seconds hold - Sit to Stand with Counter Support  - 2 x daily - 6 x weekly - 1-2 sets - 10 reps   ASSESSMENT:   CLINICAL IMPRESSION:   Extension mobility most evident limitation in mobility at this time, noted in ambulation and functional movements.  Cues given for continued focus on  all mobility throughout day.  Continued skilled PT services indicated at this time.   OBJECTIVE IMPAIRMENTS Abnormal gait, decreased activity tolerance, decreased balance, decreased knowledge of condition, decreased knowledge of use of DME, decreased mobility, difficulty walking, decreased ROM, decreased strength, increased edema, increased muscle spasms, impaired flexibility, postural dysfunction, and pain.    ACTIVITY LIMITATIONS community activity, driving, and personal recreation like Yoga & walking dogs .    PERSONAL FACTORS 1-2 comorbidities: see PMH  are also affecting patient's functional outcome.    REHAB POTENTIAL: Good   CLINICAL DECISION MAKING: Stable/uncomplicated   EVALUATION COMPLEXITY: Low     GOALS: Goals reviewed with patient? Yes   SHORT TERM GOALS: Target date: 06/22/2021   Patient independent and verbalizes compliance with initial HEP Baseline: SEE OBJECTIVE DATA Goal status: Met 07/26/2021   2.  Patient reports 50% improvement in right knee pain. Baseline: SEE OBJECTIVE DATA Goal status: on going 07/26/2021   3.  PROM right knee extension -3* to flexion 90* Baseline: SEE OBJECTIVE DATA Goal status: on going 05/29/2021   LONG TERM GOALS: Target date: 08/31/2021   Patient will improve FOTO score to 67% Baseline: SEE OBJECTIVE DATA Goal status:ongoing 08/03/2021   2.  Patient reports right knee pain </= 2/10 with standing & gait activities.  Baseline: SEE OBJECTIVE DATA Goal status: ongoing 08/03/2021   3.  Right Knee PROM 0* extension to 110* flexion Baseline: SEE OBJECTIVE DATA Goal status: ongoing 08/03/2021   4.  Right Knee AROM seated -3* extension to 100* flexion Baseline: SEE OBJECTIVE DATA Goal status: ongoing 08/03/2021   5.  Patient ambulates >500' community distances including negotiating ramps, curbs & stairs without device independently. Baseline: SEE OBJECTIVE DATA Goal status:ongoing 08/03/2021   6.  Patient reports ability to perform Yoga &  walk her dogs. Baseline: SEE OBJECTIVE DATA Goal status: ongoing 08/03/2021   PLAN: PT FREQUENCY: 3x/week of manipulation, 4x/wk following week, 2-3xwk for 6 weeks   PT DURATION: 8 additional weeks   PLANNED INTERVENTIONS: Therapeutic exercises, Therapeutic activity, Neuromuscular re-education, Balance training, Gait training, Patient/Family education, Joint mobilization, Stair training, Vestibular training, DME instructions, Electrical stimulation, Cryotherapy, Moist heat, scar mobilization, Taping, Vasopneumatic device, physical performance testing and Manual therapy   PLAN FOR NEXT SESSION:  Continue hybrid stretching for capsule, myofascial impairments for mobility improvements.   Scot Jun, PT, DPT, OCS, ATC 08/03/21  10:15 AM

## 2021-08-06 ENCOUNTER — Ambulatory Visit (INDEPENDENT_AMBULATORY_CARE_PROVIDER_SITE_OTHER): Payer: No Typology Code available for payment source | Admitting: Physical Therapy

## 2021-08-06 ENCOUNTER — Encounter: Payer: Self-pay | Admitting: Physical Therapy

## 2021-08-06 DIAGNOSIS — R2689 Other abnormalities of gait and mobility: Secondary | ICD-10-CM

## 2021-08-06 DIAGNOSIS — M6281 Muscle weakness (generalized): Secondary | ICD-10-CM | POA: Diagnosis not present

## 2021-08-06 DIAGNOSIS — M25661 Stiffness of right knee, not elsewhere classified: Secondary | ICD-10-CM | POA: Diagnosis not present

## 2021-08-06 DIAGNOSIS — M25561 Pain in right knee: Secondary | ICD-10-CM | POA: Diagnosis not present

## 2021-08-06 DIAGNOSIS — R2681 Unsteadiness on feet: Secondary | ICD-10-CM

## 2021-08-06 DIAGNOSIS — R600 Localized edema: Secondary | ICD-10-CM

## 2021-08-06 NOTE — Therapy (Signed)
OUTPATIENT PHYSICAL THERAPY TREATMENT NOTE   Patient Name: Cheryl Tate MRN: [id number] DOB:May 05, 1970, 51 y.o., female Today's Date: 08/06/2021   END OF SESSION:   PT End of Session - 08/06/21 0852     Visit Number 33    Number of Visits 36    Date for PT Re-Evaluation 08/31/21    Authorization Type [employer]    Authorization Time Period [facility] CHOICE  NO VISIT LIMIT, PATIENT PAYS 100% UNTIL DED IS MET, DED $7500 [id number] MET, TERM DATE 09/19/2021    Progress Note Due on Visit 51    PT Start Time 0846    PT Stop Time 0944    PT Time Calculation (min) 58 min    Activity Tolerance Patient limited by pain    Behavior During Therapy [facility] for tasks assessed/performed                  Past Medical History:  Diagnosis Date   Anemia    Depression    Menorrhagia    Mood disorder (HCC)    PONV (postoperative nausea and vomiting)    Von Willebrand's disease ([place])    followed by hemotologist Dr. Marin Olp   Past Surgical History:  Procedure Laterality Date   APPENDECTOMY  01/22/2004   DILATION AND CURETTAGE OF UTERUS  2002   x 2 in 2002   DILITATION & CURRETTAGE/HYSTROSCOPY WITH HYDROTHERMAL ABLATION N/A 11/22/2014   Procedure: HYSTEROSCOPY WITH HYDROTHERMAL ABLATION;  Surgeon: Dian Queen, MD;  Location: [place];  Service: Gynecology;  Laterality: N/A;   EXAM UNDER ANESTHESIA WITH MANIPULATION OF KNEE Right 07/12/2021   Procedure: RIGHT EXAM UNDER ANESTHESIA WITH MANIPULATION OF KNEE;  Surgeon: Meredith Pel, MD;  Location: [place];  Service: Orthopedics;  Laterality: Right;   HYSTEROSCOPY WITH D & C  08-08-2014  in [place]   MULTIPLE TOOTH EXTRACTIONS     TOTAL KNEE ARTHROPLASTY Right 05/22/2021   Procedure: RIGHT TOTAL KNEE ARTHROPLASTY;  Surgeon: Meredith Pel, MD;  Location: [place];  Service: Orthopedics;  Laterality: Right;   Patient Active Problem List   Diagnosis Date Noted   Fibrosis of right knee joint    Arthritis of right knee    S/P  knee replacement 05/22/2021   BMI 33.0-33.9,adult 06/16/2015   Von Willebrand disease ([place]) 06/13/2015   Depression 12/29/2014   Rhinitis, allergic 12/28/2014    PCP: Eunice Blase, MD   REFERRING PROVIDER: Donella Stade, PA-C   REFERRING DIAG: [phone] (ICD-10-CM) - History of total knee arthroplasty, unspecified laterality  ONSET DATE: 05/22/2021 Right TKA   THERAPY DIAG:  Stiffness of right knee, not elsewhere classified  Muscle weakness (generalized)  Acute pain of right knee  Other abnormalities of gait and mobility  Localized edema  Unsteadiness on feet  PERTINENT HISTORY: Von Willebrand disease, depression, Menorrhgia  PRECAUTIONS: None  SUBJECTIVE: She has done the exercises over the weekend, mixing biking and the weighted heel prop with her other exercises. She noted walking yesterday without a limp. She didn't sleep well last night, saying the pain woke her up and she took tylenol and iced her knee but neither helped. She said she felt discouraged yesterday with her extension progress despite her hard work, and she also has other stressors on her mind.  PAIN:  NPRS scale: 2/10 this morning  Pain location: right knee all over.  Pain description: sharp, sore, cramps Aggravating factors: weather, stiffness in morning Relieving factors: ice, meds   OBJECTIVE:  PATIENT SURVEYS:  05/28/2021 FOTO 29% functional & target 67%   COGNITION: 05/28/2021 Overall cognitive status: Within functional limits for tasks assessed                EDEMA: 05/28/2021 RLE above knee 51.8cm around knee 48cm below knee  41.2cm LLE above knee 45.8cm around knee 40cm below knee 34.1cm   PALPATION: 05/28/2021 Bruising along medial knee & lower leg, tenderness over joint line, quads, hamstrings   LE ROM:   ROM P: Passive  A: Active Right 05/28/21 Right 05/31/21 Right 06/05/21 Right 06/06/21 Right 06/11/21 Right 06/14/21 Right 06/19/21 Right 06/26/21 Right 06/28/21 Right 07/02/21  Right 07/13/21 Right 07/19/21 Right 07/20/21 Right 07/23/2021 Right 07/27/21 Right 07/30/21 Right 08/06/21  Hip flexion                    Hip extension                    Hip abduction                    Hip adduction                    Hip internal rotation                    Hip external rotation                    Knee flexion Supine P: 64* Seated  A: 64* Supine AROM heel slide:61 PROM overpressure in heel slide : 66   Supine AA: 73* Seated  P: 75*  Seated P: 79* Seated P: 75 Seated P: 70 with muscle guarding limiting Seated P:78 after manual therapy Seated P: 75*  A: 64* after manual therapy Supine A 70 P 78 AA 84 (after exercise) Seated P:85 After maual therapy Seated P:93 after manual therapy Seated P: 97*  Seated P: 98* Seated A: 85* P: 100* After manual therapy  Knee extension Supine P: -10* Seated  A: -31* Seated AROM LAQ: -21 Seated LAQ A: -14* Supine P: -9* Supine A: -8 Supine P: -7* Supine A: -8 P:-6 Supine A:-8 P:-5  Seated P: -7* after manual therapy Seated  A -16 (seated LAQ)   Supine P: -9* Supine  P:-9 after manual therapy Supine P: -8* after manual Supine A: -15* P: -9* after manual  Ankle dorsiflexion                    Ankle plantarflexion                    Ankle inversion                    Ankle eversion                     (Blank rows = not tested)   LE MMT:   MMT Right 05/28/2021 Left 05/28/2021 Right 08/03/2021  Hip flexion       Hip extension       Hip abduction       Hip adduction       Hip internal rotation       Hip external rotation       Knee flexion 2/5  4/5  Knee extension 2/5    Ankle dorsiflexion       Ankle plantarflexion       Ankle inversion  Ankle eversion        (Blank rows = not tested)   GAIT: 08/03/2021  SPC use into clinic, reduced extension throughout gait cycle.   05/28/2021 Distance walked: 60' Assistive device utilized:  4 footed walker level of assistance: SBA  verbal cues  needed Comments: antalgic, right knee flexed in stance, limited to no increase flexion for swing, RLE abducted,    Functional Activities: Pt using LLE to move RLE in & out of bed.  PT min guard RLE and she was able to move RLE without assisting with LLE.    TODAY'S TREATMENT: 08/06/21 Therex -Sci fit bike seat 7 level 2 full revolutions X 11 min with verbal cueing for full straightening and bending. PT cued for full ext on push out & flex on bend requiring frequent reminders.  -Standing RLE knee flexion stretch and knee extension hamstring stretch on 8" step 3 x 20s each -RLE step up over and down on 6" step in //bars, verbal cues for knee extension - x4 with BUE support, x2 with fingertip touch support, x4 with no UE support, verbal cues on knee ext upon arising.  -PT reviewed updated HEP (see below for updated version) with verbal explanation and handout, pt verbalized understanding  Manual -Standing knee extension grade 3 mobs with belt and gastroc stretch 2x10 reps -Seated knee flexion PROM with overpressure and tibial IR/distraction to tolerance -Supine knee extension A-P grade 3-4 mobs  Vaso R knee 10 minutes High Pressure 34* post-treatment with knee extension prop for 8 min  08/03/21 Therex -Sci fit bike seat 6-7 full revolutions X 8 min -Leg press DL 75# moving into max flexion and holding 5 seconds then max extension and holding 5 sec 2 x 15 -Runner stretch 15 sec x 5 c Rt leg posterior -heel off step 15 sec x1 Rt Prone hang 2 mins prior to manual -heel prop X 5 min for extension stretch  Manual Prone extension stretching c overpressure, percussive device to hamstring in extension stretching.  Contact/relax to hamstring for extension.  Supine extension c ER mobilization c movement.    -Vaso R knee 10 minutes High Pressure 34* post-treatment with knee extension prop  08/02/21 -Sci fit bike seat 7 full revolutions X 8 min -Leg press DL 75# moving into max flexion and holding  5 seconds then max extension and holding 5 sec 3X10 -March walking at counter top 3 round trips -tandem walking at counter top 3 round trips -6 inch step fwd step up with Rt and down in front with left, bilat UE support PRN X 15 -Lunge stretch for knee flexion 10 sec X5 with foot on 6 inch step. -heel prop X 5 min for extension stretch  -Supine knee extension and flexion PROM x 10 min  -Vaso R knee 10 minutes High Pressure 34* post-treatment with knee extension prop    HOME EXERCISE PROGRAM: Access Code: [email or web address] URL: [email or web address] Date: 08/06/2021 Prepared by: Jamey Reas  Exercises - Supine Heel Slide with Strap  - 2-3 x daily - 7 x weekly - 2-3 sets - 10 reps - 5 seconds hold - Supine Quadriceps Stretch with Strap on Table  - 2-3 x daily - 7 x weekly - 1 sets - 2-3 reps - 20-30 seconds hold - Seated Knee Flexion Stretch  - 2-5 x daily - 7 x weekly - 2-3 sets - 10 reps - 5 sec hold - Seated Passive Knee Extension with Weight  - 2-5 x daily - 7 x  weekly - 1 sets - 1 reps - 5-10 minutes hold - Seated Hamstring Stretch with Strap  - 2-4 x daily - 7 x weekly - 1 sets - 3 reps - 20-30 seconds hold - Sit to Stand with Counter Support  - 2-4 x daily - 7 x weekly - 2-3 sets - 10 reps - Gastroc Stretch on Step  - 2-4 x daily - 7 x weekly - 2-3 sets - 10 reps - 20-30 seconds hold - Standing Hamstring Stretch with Step  - 1-2 x daily - 7 x weekly - 1 sets - 3-10 reps - 20-30 seconds hold - Standing Knee Flexion Stretch on Step  - 1-2 x daily - 7 x weekly - 1 sets - 3-10 reps - 20-30 seconds hold - Prone Knee Extension with Ankle Weight  - 1-2 x daily - 7 x weekly - 1 sets - 1 reps - 5-10 minutes hold - Prone Knee Flexion Extension AROM  - 1-2 x daily - 7 x weekly - 2 sets - 10 reps - 5 seconds hold - Prone Hip Internal Rotation AROM  - 1-2 x daily - 7 x weekly - 2 sets - 10 reps - 5 seconds hold - Prone Hip External Rotation AROM  - 1-2 x daily - 7 x weekly - 2 sets -  10 reps - 5 seconds hold - Prone Quadriceps Stretch with Strap  - 1-2 x daily - 7 x weekly - 1 sets - 3 reps - 20-30 seconds hold   ASSESSMENT:   CLINICAL IMPRESSION:   Active and passive knee extension remains the primary limitation for functional mobility and the patient's prioritized concern. She felt discouraged following the weekend and today's session without seeing much improved range of motion from the prior week. She was given the updated HEP handout in line with progressed recommendations and verbalized understanding. Functional activity will be the priority when evaluating for discharge or a re-certification, and skilled PT continues to be beneficial at this time.  OBJECTIVE IMPAIRMENTS Abnormal gait, decreased activity tolerance, decreased balance, decreased knowledge of condition, decreased knowledge of use of DME, decreased mobility, difficulty walking, decreased ROM, decreased strength, increased edema, increased muscle spasms, impaired flexibility, postural dysfunction, and pain.    ACTIVITY LIMITATIONS community activity, driving, and personal recreation like Yoga & walking dogs .    PERSONAL FACTORS 1-2 comorbidities: see PMH  are also affecting patient's functional outcome.    REHAB POTENTIAL: Good   CLINICAL DECISION MAKING: Stable/uncomplicated   EVALUATION COMPLEXITY: Low     GOALS: Goals reviewed with patient? Yes   SHORT TERM GOALS: Target date: 06/22/2021   Patient independent and verbalizes compliance with initial HEP Baseline: SEE OBJECTIVE DATA Goal status: Met 07/26/2021   2.  Patient reports 50% improvement in right knee pain. Baseline: SEE OBJECTIVE DATA Goal status: on going 07/26/2021   3.  PROM right knee extension -3* to flexion 90* Baseline: SEE OBJECTIVE DATA Goal status: on going 05/29/2021   LONG TERM GOALS: Target date: 08/31/2021   Patient will improve FOTO score to 67% Baseline: SEE OBJECTIVE DATA Goal status:ongoing 08/03/2021   2.  Patient  reports right knee pain </= 2/10 with standing & gait activities.  Baseline: SEE OBJECTIVE DATA Goal status: ongoing 08/03/2021   3.  Right Knee PROM 0* extension to 110* flexion Baseline: SEE OBJECTIVE DATA Goal status: ongoing 08/03/2021   4.  Right Knee AROM seated -3* extension to 100* flexion Baseline: SEE OBJECTIVE DATA Goal status:  ongoing 08/03/2021   5.  Patient ambulates >500' community distances including negotiating ramps, curbs & stairs without device independently. Baseline: SEE OBJECTIVE DATA Goal status:ongoing 08/03/2021   6.  Patient reports ability to perform Yoga & walk her dogs. Baseline: SEE OBJECTIVE DATA Goal status: ongoing 08/03/2021   PLAN: PT FREQUENCY: 3x/week of manipulation, 4x/wk following week, 2-3xwk for 6 weeks   PT DURATION: 8 additional weeks   PLANNED INTERVENTIONS: Therapeutic exercises, Therapeutic activity, Neuromuscular re-education, Balance training, Gait training, Patient/Family education, Joint mobilization, Stair training, Vestibular training, DME instructions, Electrical stimulation, Cryotherapy, Moist heat, scar mobilization, Taping, Vasopneumatic device, physical performance testing and Manual therapy   PLAN FOR NEXT SESSION:  Continue emphasis on knee extension ROM and active extension during functional activity, manual stretching in rest and with activity.  Will need recert at visit 49. Frequency is now at 3x/wk.   Jana Hakim, SPT 08/06/2021, 10:20 AM  This entire session of physical therapy was performed under the direct supervision of PT signing evaluation /treatment. PT reviewed note and agrees.  Jamey Reas, PT, DPT 08/06/21  11:26 AM

## 2021-08-07 NOTE — Therapy (Signed)
OUTPATIENT PHYSICAL THERAPY TREATMENT NOTE   Patient Name: Cheryl Tate MRN: [id number] DOB:[birth year], 51 y.o., female Today's Date: 08/08/2021   END OF SESSION:   PT End of Session - 08/08/21 0929     Visit Number 34    Number of Visits 36    Date for PT Re-Evaluation 08/31/21    Authorization Type [employer]    Authorization Time Period [facility] CHOICE  NO VISIT LIMIT, PATIENT PAYS 100% UNTIL DED IS MET, DED $7500 [id number] MET, TERM DATE 09/19/2021    Progress Note Due on Visit 88    PT Start Time 0929    PT Stop Time 1023    PT Time Calculation (min) 54 min    Activity Tolerance Patient limited by pain    Behavior During Therapy [facility] for tasks assessed/performed             Past Medical History:  Diagnosis Date   Anemia    Depression    Menorrhagia    Mood disorder (HCC)    PONV (postoperative nausea and vomiting)    Von Willebrand's disease ([place])    followed by hemotologist Dr. Marin Olp   Past Surgical History:  Procedure Laterality Date   APPENDECTOMY  01/22/2004   DILATION AND CURETTAGE OF UTERUS  2002   x 2 in 2002   DILITATION & CURRETTAGE/HYSTROSCOPY WITH HYDROTHERMAL ABLATION N/A 11/22/2014   Procedure: HYSTEROSCOPY WITH HYDROTHERMAL ABLATION;  Surgeon: Dian Queen, MD;  Location: [place];  Service: Gynecology;  Laterality: N/A;   EXAM UNDER ANESTHESIA WITH MANIPULATION OF KNEE Right 07/12/2021   Procedure: RIGHT EXAM UNDER ANESTHESIA WITH MANIPULATION OF KNEE;  Surgeon: Meredith Pel, MD;  Location: [place];  Service: Orthopedics;  Laterality: Right;   HYSTEROSCOPY WITH D & C  08-08-2014  in [place]   MULTIPLE TOOTH EXTRACTIONS     TOTAL KNEE ARTHROPLASTY Right 05/22/2021   Procedure: RIGHT TOTAL KNEE ARTHROPLASTY;  Surgeon: Meredith Pel, MD;  Location: [place];  Service: Orthopedics;  Laterality: Right;   Patient Active Problem List   Diagnosis Date Noted   Fibrosis of right knee joint    Arthritis of right knee    S/P knee  replacement 05/22/2021   BMI 33.0-33.9,adult 06/16/2015   Von Willebrand disease ([place]) 06/13/2015   Depression 12/29/2014   Rhinitis, allergic 12/28/2014    PCP: Eunice Blase, MD   REFERRING PROVIDER: Donella Stade, PA-C   REFERRING DIAG: [phone] (ICD-10-CM) - History of total knee arthroplasty, unspecified laterality  ONSET DATE: 05/22/2021 Right TKA   THERAPY DIAG:  Stiffness of right knee, not elsewhere classified  Muscle weakness (generalized)  Acute pain of right knee  Other abnormalities of gait and mobility  Localized edema  Unsteadiness on feet  PERTINENT HISTORY: Von Willebrand disease, depression, Menorrhgia  PRECAUTIONS: None  SUBJECTIVE: She asked about knee straightening to reduce her limp and was worried about retaining the limp permanently. She will be taking a 2 hour car ride on Friday and asked about how to best handle the car ride for her knee.  PAIN:  NPRS scale: 1/10 this morning  Pain location: right knee all over.  Pain description: sharp, sore, cramps Aggravating factors: weather, stiffness in morning Relieving factors: ice, meds   OBJECTIVE:    PATIENT SURVEYS:  05/28/2021 FOTO 29% functional & target 67%   COGNITION: 05/28/2021 Overall cognitive status: Within functional limits for tasks assessed  EDEMA: 05/28/2021 RLE above knee 51.8cm around knee 48cm below knee  41.2cm LLE above knee 45.8cm around knee 40cm below knee 34.1cm   PALPATION: 05/28/2021 Bruising along medial knee & lower leg, tenderness over joint line, quads, hamstrings   LE ROM:   ROM P: Passive  A: Active Right 05/28/21 Right 05/31/21 Right 06/05/21 Right 06/06/21 Right 06/11/21 Right 06/14/21 Right 06/19/21 Right 06/26/21 Right 06/28/21 Right 07/02/21 Right 07/13/21 Right 07/19/21 Right 07/20/21 Right 07/23/2021 Right 07/27/21 Right 07/30/21 Right 08/06/21 Right 08/08/21  Hip flexion                     Hip extension                     Hip abduction                      Hip adduction                     Hip internal rotation                     Hip external rotation                     Knee flexion Supine P: 64* Seated  A: 64* Supine AROM heel slide:61 PROM overpressure in heel slide : 66   Supine AA: 73* Seated  P: 75*  Seated P: 79* Seated P: 75 Seated P: 70 with muscle guarding limiting Seated P:78 after manual therapy Seated P: 75*  A: 64* after manual therapy Supine A 70 P 78 AA 84 (after exercise) Seated P:85 After maual therapy Seated P:93 after manual therapy Seated P: 97*  Seated P: 98* Seated A: 85* P: 100* After manual therapy   Knee extension Supine P: -10* Seated  A: -31* Seated AROM LAQ: -21 Seated LAQ A: -14* Supine P: -9* Supine A: -8 Supine P: -7* Supine A: -8 P:-6 Supine A:-8 P:-5  Seated P: -7* after manual therapy Seated  A -16 (seated LAQ)   Supine P: -9* Supine  P:-9 after manual therapy Supine P: -8* after manual Supine A: -15* P: -9* after manual Standing A: -9* after some manual  Ankle dorsiflexion                     Ankle plantarflexion                     Ankle inversion                     Ankle eversion                      (Blank rows = not tested)   LE MMT:   MMT Right 05/28/2021 Left 05/28/2021 Right 08/03/2021  Hip flexion       Hip extension       Hip abduction       Hip adduction       Hip internal rotation       Hip external rotation       Knee flexion 2/5  4/5  Knee extension 2/5    Ankle dorsiflexion       Ankle plantarflexion       Ankle inversion       Ankle eversion        (Blank rows = not  tested)   GAIT: 08/03/2021  SPC use into clinic, reduced extension throughout gait cycle.   05/28/2021 Distance walked: 23' Assistive device utilized:  4 footed walker level of assistance: SBA  verbal cues needed Comments: antalgic, right knee flexed in stance, limited to no increase flexion for swing, RLE abducted,    Functional Activities: Pt using LLE to move RLE  in & out of bed.  PT min guard RLE and she was able to move RLE without assisting with LLE.    TODAY'S TREATMENT: 08/07/21 TherEx -Leg press DL 75# moving into max flexion holding 5 seconds then max extension holding 5 sec 2 x 20 reps, RLE 37# 5s hold 1 x 10 reps -Seated LAQ with strap for terminal knee ext 1 x 15 reps. SPT demo, verbal & tactile cues to use strap for increasing active range.  -Seated AAROM knee flexion using LLE to flex RLE  Manual -Knee extension grade 3 mobilizations long sitting -Knee extension grade 3 mobilizations and PROM supine with towel roll prop -Knee extension belted mobilizations during standing yoga poses: downward dog, warrior 2, triangle, tree pose. PT cueing to use proper Yoga form grounding through LEs.  PT recommended trying standing poses at home near counter or support for safety.  Pt verbalized understanding.    Vaso R knee 6 minutes High Pressure 34* post-treatment with knee extension prop for 5 min, pt reported high sensitivity to cold and asked to stop  08/06/21 Therex -Sci fit bike seat 7 level 2 full revolutions X 11 min with verbal cueing for full straightening and bending. PT cued for full ext on push out & flex on bend requiring frequent reminders.  -Standing RLE knee flexion stretch and knee extension hamstring stretch on 8" step 3 x 20s each -RLE step up over and down on 6" step in //bars, verbal cues for knee extension - x4 with BUE support, x2 with fingertip touch support, x4 with no UE support, verbal cues on knee ext upon arising.  -PT reviewed updated HEP (see below for updated version) with verbal explanation and handout, pt verbalized understanding  Manual -Standing knee extension grade 3 mobs with belt and gastroc stretch 2x10 reps -Seated knee flexion PROM with overpressure and tibial IR/distraction to tolerance -Supine knee extension A-P grade 3-4 mobs  Vaso R knee 10 minutes High Pressure 34* post-treatment with knee extension prop  for 8 min  08/03/21 Therex -Sci fit bike seat 6-7 full revolutions X 8 min -Leg press DL 75# moving into max flexion and holding 5 seconds then max extension and holding 5 sec 2 x 15 -Runner stretch 15 sec x 5 c Rt leg posterior -heel off step 15 sec x1 Rt Prone hang 2 mins prior to manual -heel prop X 5 min for extension stretch  Manual Prone extension stretching c overpressure, percussive device to hamstring in extension stretching.  Contact/relax to hamstring for extension.  Supine extension c ER mobilization c movement.    -Vaso R knee 10 minutes High Pressure 34* post-treatment with knee extension prop   HOME EXERCISE PROGRAM: Access Code: [email or web address] URL: [email or web address] Date: 08/06/2021 Prepared by: Jamey Reas  Exercises - Supine Heel Slide with Strap  - 2-3 x daily - 7 x weekly - 2-3 sets - 10 reps - 5 seconds hold - Supine Quadriceps Stretch with Strap on Table  - 2-3 x daily - 7 x weekly - 1 sets - 2-3 reps - 20-30 seconds hold - Seated Knee Flexion Stretch  -  2-5 x daily - 7 x weekly - 2-3 sets - 10 reps - 5 sec hold - Seated Passive Knee Extension with Weight  - 2-5 x daily - 7 x weekly - 1 sets - 1 reps - 5-10 minutes hold - Seated Hamstring Stretch with Strap  - 2-4 x daily - 7 x weekly - 1 sets - 3 reps - 20-30 seconds hold - Sit to Stand with Counter Support  - 2-4 x daily - 7 x weekly - 2-3 sets - 10 reps - Gastroc Stretch on Step  - 2-4 x daily - 7 x weekly - 2-3 sets - 10 reps - 20-30 seconds hold - Standing Hamstring Stretch with Step  - 1-2 x daily - 7 x weekly - 1 sets - 3-10 reps - 20-30 seconds hold - Standing Knee Flexion Stretch on Step  - 1-2 x daily - 7 x weekly - 1 sets - 3-10 reps - 20-30 seconds hold - Prone Knee Extension with Ankle Weight  - 1-2 x daily - 7 x weekly - 1 sets - 1 reps - 5-10 minutes hold - Prone Knee Flexion Extension AROM  - 1-2 x daily - 7 x weekly - 2 sets - 10 reps - 5 seconds hold - Prone Hip Internal Rotation  AROM  - 1-2 x daily - 7 x weekly - 2 sets - 10 reps - 5 seconds hold - Prone Hip External Rotation AROM  - 1-2 x daily - 7 x weekly - 2 sets - 10 reps - 5 seconds hold - Prone Quadriceps Stretch with Strap  - 1-2 x daily - 7 x weekly - 1 sets - 3 reps - 20-30 seconds hold   ASSESSMENT:   CLINICAL IMPRESSION:   She remains limited in knee extension mobility and strength, limiting functional straightening with activity. It was recommended that she begin yoga again at home with counter support if needed and focus on form, stability, and breathing. Functional activity continues to be emphasized as a priority.  OBJECTIVE IMPAIRMENTS Abnormal gait, decreased activity tolerance, decreased balance, decreased knowledge of condition, decreased knowledge of use of DME, decreased mobility, difficulty walking, decreased ROM, decreased strength, increased edema, increased muscle spasms, impaired flexibility, postural dysfunction, and pain.    ACTIVITY LIMITATIONS community activity, driving, and personal recreation like Yoga & walking dogs .    PERSONAL FACTORS 1-2 comorbidities: see PMH  are also affecting patient's functional outcome.    REHAB POTENTIAL: Good   CLINICAL DECISION MAKING: Stable/uncomplicated   EVALUATION COMPLEXITY: Low     GOALS: Goals reviewed with patient? Yes   SHORT TERM GOALS: Target date: 06/22/2021   Patient independent and verbalizes compliance with initial HEP Baseline: SEE OBJECTIVE DATA Goal status: Met 07/26/2021   2.  Patient reports 50% improvement in right knee pain. Baseline: SEE OBJECTIVE DATA Goal status: on going 07/26/2021   3.  PROM right knee extension -3* to flexion 90* Baseline: SEE OBJECTIVE DATA Goal status: on going 05/29/2021   LONG TERM GOALS: Target date: 08/31/2021   Patient will improve FOTO score to 67% Baseline: SEE OBJECTIVE DATA Goal status:ongoing 08/03/2021   2.  Patient reports right knee pain </= 2/10 with standing & gait activities.   Baseline: SEE OBJECTIVE DATA Goal status: ongoing 08/03/2021   3.  Right Knee PROM 0* extension to 110* flexion Baseline: SEE OBJECTIVE DATA Goal status: ongoing 08/03/2021   4.  Right Knee AROM seated -3* extension to 100* flexion Baseline: SEE OBJECTIVE DATA  Goal status: ongoing 08/03/2021   5.  Patient ambulates >500' community distances including negotiating ramps, curbs & stairs without device independently. Baseline: SEE OBJECTIVE DATA Goal status:ongoing 08/03/2021   6.  Patient reports ability to perform Yoga & walk her dogs. Baseline: SEE OBJECTIVE DATA Goal status: ongoing 08/03/2021   PLAN: PT FREQUENCY: 3x/week of manipulation, 4x/wk following week, 2-3xwk for 6 weeks   PT DURATION: 8 additional weeks   PLANNED INTERVENTIONS: Therapeutic exercises, Therapeutic activity, Neuromuscular re-education, Balance training, Gait training, Patient/Family education, Joint mobilization, Stair training, Vestibular training, DME instructions, Electrical stimulation, Cryotherapy, Moist heat, scar mobilization, Taping, Vasopneumatic device, physical performance testing and Manual therapy   PLAN FOR NEXT SESSION: Continue emphasis on knee extension ROM and active extension during functional activity, manual stretching at rest/standing with activity. Vaso at warmer temp or medium compression, limited by sensitivity. Will need recert at visit 45. Frequency is now at 3x/wk.   Jana Hakim, SPT 08/07/2021, 10:56 AM  This entire session of physical therapy was performed under the direct supervision of PT signing evaluation /treatment. PT reviewed note and agrees.  Jamey Reas, PT, DPT 08/08/21  10:56 AM

## 2021-08-08 ENCOUNTER — Encounter: Payer: Self-pay | Admitting: Physical Therapy

## 2021-08-08 ENCOUNTER — Ambulatory Visit (INDEPENDENT_AMBULATORY_CARE_PROVIDER_SITE_OTHER): Payer: No Typology Code available for payment source | Admitting: Physical Therapy

## 2021-08-08 DIAGNOSIS — R2689 Other abnormalities of gait and mobility: Secondary | ICD-10-CM

## 2021-08-08 DIAGNOSIS — M6281 Muscle weakness (generalized): Secondary | ICD-10-CM | POA: Diagnosis not present

## 2021-08-08 DIAGNOSIS — R2681 Unsteadiness on feet: Secondary | ICD-10-CM

## 2021-08-08 DIAGNOSIS — M25561 Pain in right knee: Secondary | ICD-10-CM | POA: Diagnosis not present

## 2021-08-08 DIAGNOSIS — R600 Localized edema: Secondary | ICD-10-CM

## 2021-08-08 DIAGNOSIS — M25661 Stiffness of right knee, not elsewhere classified: Secondary | ICD-10-CM | POA: Diagnosis not present

## 2021-08-10 ENCOUNTER — Ambulatory Visit (INDEPENDENT_AMBULATORY_CARE_PROVIDER_SITE_OTHER): Payer: No Typology Code available for payment source | Admitting: Physical Therapy

## 2021-08-10 ENCOUNTER — Encounter: Payer: Self-pay | Admitting: Physical Therapy

## 2021-08-10 DIAGNOSIS — M25561 Pain in right knee: Secondary | ICD-10-CM

## 2021-08-10 DIAGNOSIS — R2689 Other abnormalities of gait and mobility: Secondary | ICD-10-CM | POA: Diagnosis not present

## 2021-08-10 DIAGNOSIS — M6281 Muscle weakness (generalized): Secondary | ICD-10-CM | POA: Diagnosis not present

## 2021-08-10 DIAGNOSIS — R600 Localized edema: Secondary | ICD-10-CM

## 2021-08-10 DIAGNOSIS — M25661 Stiffness of right knee, not elsewhere classified: Secondary | ICD-10-CM | POA: Diagnosis not present

## 2021-08-10 NOTE — Therapy (Signed)
OUTPATIENT PHYSICAL THERAPY TREATMENT NOTE   Patient Name: Cheryl Tate MRN: [id number] DOB:1970/06/24, 51 y.o., female Today's Date: 08/10/2021   END OF SESSION:   PT End of Session - 08/10/21 1027     Visit Number 35    Number of Visits 36    Date for PT Re-Evaluation 08/31/21    Authorization Type [employer]    Authorization Time Period [employer] CHOICE  NO VISIT LIMIT, PATIENT PAYS 100% UNTIL DED IS MET, DED $7500 [id number] MET, TERM DATE 09/19/2021    Progress Note Due on Visit 72    PT Start Time 0930    PT Stop Time 1018    PT Time Calculation (min) 48 min    Activity Tolerance Patient tolerated treatment well    Behavior During Therapy WFL for tasks assessed/performed             Past Medical History:  Diagnosis Date   Anemia    Depression    Menorrhagia    Mood disorder (HCC)    PONV (postoperative nausea and vomiting)    Von Willebrand's disease ([place])    followed by hemotologist Dr. Marin Olp   Past Surgical History:  Procedure Laterality Date   APPENDECTOMY  01/22/2004   DILATION AND CURETTAGE OF UTERUS  2002   x 2 in 2002   DILITATION & CURRETTAGE/HYSTROSCOPY WITH HYDROTHERMAL ABLATION N/A 11/22/2014   Procedure: HYSTEROSCOPY WITH HYDROTHERMAL ABLATION;  Surgeon: Dian Queen, MD;  Location: [facility];  Service: Gynecology;  Laterality: N/A;   EXAM UNDER ANESTHESIA WITH MANIPULATION OF KNEE Right 07/12/2021   Procedure: RIGHT EXAM UNDER ANESTHESIA WITH MANIPULATION OF KNEE;  Surgeon: Meredith Pel, MD;  Location: [facility];  Service: Orthopedics;  Laterality: Right;   HYSTEROSCOPY WITH D & C  08-08-2014  in [place]   MULTIPLE TOOTH EXTRACTIONS     TOTAL KNEE ARTHROPLASTY Right 05/22/2021   Procedure: RIGHT TOTAL KNEE ARTHROPLASTY;  Surgeon: Meredith Pel, MD;  Location: [facility];  Service: Orthopedics;  Laterality: Right;   Patient Active Problem List   Diagnosis Date Noted   Fibrosis of right knee joint    Arthritis of right knee    S/P  knee replacement 05/22/2021   BMI 33.0-33.9,adult 06/16/2015   Von Willebrand disease ([place]) 06/13/2015   Depression 12/29/2014   Rhinitis, allergic 12/28/2014    PCP: Eunice Blase, MD   REFERRING PROVIDER: Donella Stade, PA-C   REFERRING DIAG: [phone] (ICD-10-CM) - History of total knee arthroplasty, unspecified laterality  ONSET DATE: 05/22/2021 Right TKA   THERAPY DIAG:  Stiffness of right knee, not elsewhere classified  Muscle weakness (generalized)  Acute pain of right knee  Other abnormalities of gait and mobility  Localized edema  PERTINENT HISTORY: Von Willebrand disease, depression, Menorrhgia  PRECAUTIONS: None  SUBJECTIVE: She relays she has been working hard at home on HEP, particularly extension.  PAIN:  NPRS scale: 1/10 this morning  Pain location: right knee all over.  Pain description: sharp, sore, cramps Aggravating factors: weather, stiffness in morning Relieving factors: ice, meds   OBJECTIVE:    PATIENT SURVEYS:  05/28/2021 FOTO 29% functional & target 67%   COGNITION: 05/28/2021 Overall cognitive status: Within functional limits for tasks assessed                EDEMA: 05/28/2021 RLE above knee 51.8cm around knee 48cm below knee  41.2cm LLE above knee 45.8cm around knee 40cm below knee 34.1cm   PALPATION: 05/28/2021 Bruising along medial  knee & lower leg, tenderness over joint line, quads, hamstrings   LE ROM:   ROM P: Passive  A: Active Right 05/28/21 Right 05/31/21 Right 06/05/21 Right 06/06/21 Right 06/11/21 Right 06/14/21 Right 06/19/21 Right 06/26/21 Right 06/28/21 Right 07/02/21 Right 07/13/21 Right 07/19/21 Right 07/20/21 Right 07/23/2021 Right 07/27/21 Right 07/30/21 Right 08/06/21 Right 08/08/21  Hip flexion                     Hip extension                     Hip abduction                     Hip adduction                     Hip internal rotation                     Hip external rotation                     Knee flexion Supine P:  64* Seated  A: 64* Supine AROM heel slide:61 PROM overpressure in heel slide : 66   Supine AA: 73* Seated  P: 75*  Seated P: 79* Seated P: 75 Seated P: 70 with muscle guarding limiting Seated P:78 after manual therapy Seated P: 75*  A: 64* after manual therapy Supine A 70 P 78 AA 84 (after exercise) Seated P:85 After maual therapy Seated P:93 after manual therapy Seated P: 97*  Seated P: 98* Seated A: 85* P: 100* After manual therapy   Knee extension Supine P: -10* Seated  A: -31* Seated AROM LAQ: -21 Seated LAQ A: -14* Supine P: -9* Supine A: -8 Supine P: -7* Supine A: -8 P:-6 Supine A:-8 P:-5  Seated P: -7* after manual therapy Seated  A -16 (seated LAQ)   Supine P: -9* Supine  P:-9 after manual therapy Supine P: -8* after manual Supine A: -15* P: -9* after manual Standing A: -9* after some manual  Ankle dorsiflexion                     Ankle plantarflexion                     Ankle inversion                     Ankle eversion                      (Blank rows = not tested)   LE MMT:   MMT Right 05/28/2021 Left 05/28/2021 Right 08/03/2021  Hip flexion       Hip extension       Hip abduction       Hip adduction       Hip internal rotation       Hip external rotation       Knee flexion 2/5  4/5  Knee extension 2/5    Ankle dorsiflexion       Ankle plantarflexion       Ankle inversion       Ankle eversion        (Blank rows = not tested)   GAIT: 08/03/2021  SPC use into clinic, reduced extension throughout gait cycle.   05/28/2021 Distance walked: 52' Assistive device utilized:  4 footed walker level of  assistance: SBA  verbal cues needed Comments: antalgic, right knee flexed in stance, limited to no increase flexion for swing, RLE abducted,    Functional Activities: Pt using LLE to move RLE in & out of bed.  PT min guard RLE and she was able to move RLE without assisting with LLE.    TODAY'S TREATMENT: 08/10/21 TherEx -Sci fit bike X9 min  for knee ROM -Leg press DL 75# moving into max flexion holding 5 seconds then max extension holding 5 sec 2 x 20 reps, RLE 37# 5s hold 1 x 10 reps -Standing gastroc stretch 3 X 30 seconds -March walking with focus on TKE during swing phase of contralateral limb 3 round trips at counter -Retro walking large steps with focus on TKE 3 round trips  Manual -Knee extension grade 3-4 mobilizations  in supine -Knee extension PROM with overpressure flexion and extension (emphasis on extension)   Vaso R knee 10 minutes High Pressure 34* post-treatment with knee extension prop for 3 min  08/07/21 TherEx -Leg press DL 75# moving into max flexion holding 5 seconds then max extension holding 5 sec 2 x 20 reps, RLE 37# 5s hold 1 x 10 reps -Seated LAQ with strap for terminal knee ext 1 x 15 reps. SPT demo, verbal & tactile cues to use strap for increasing active range.  -Seated AAROM knee flexion using LLE to flex RLE  Manual -Knee extension grade 3 mobilizations long sitting -Knee extension grade 3 mobilizations and PROM supine with towel roll prop -Knee extension belted mobilizations during standing yoga poses: downward dog, warrior 2, triangle, tree pose. PT cueing to use proper Yoga form grounding through LEs.  PT recommended trying standing poses at home near counter or support for safety.  Pt verbalized understanding.    Vaso R knee 6 minutes High Pressure 34* post-treatment with knee extension prop for 5 min, pt reported high sensitivity to cold and asked to stop  08/06/21 Therex -Sci fit bike seat 7 level 2 full revolutions X 11 min with verbal cueing for full straightening and bending. PT cued for full ext on push out & flex on bend requiring frequent reminders.  -Standing RLE knee flexion stretch and knee extension hamstring stretch on 8" step 3 x 20s each -RLE step up over and down on 6" step in //bars, verbal cues for knee extension - x4 with BUE support, x2 with fingertip touch support, x4  with no UE support, verbal cues on knee ext upon arising.  -PT reviewed updated HEP (see below for updated version) with verbal explanation and handout, pt verbalized understanding  Manual -Standing knee extension grade 3 mobs with belt and gastroc stretch 2x10 reps -Seated knee flexion PROM with overpressure and tibial IR/distraction to tolerance -Supine knee extension A-P grade 3-4 mobs  Vaso R knee 10 minutes High Pressure 34* post-treatment with knee extension prop for 8 min  08/03/21 Therex -Sci fit bike seat 6-7 full revolutions X 8 min -Leg press DL 75# moving into max flexion and holding 5 seconds then max extension and holding 5 sec 2 x 15 -Runner stretch 15 sec x 5 c Rt leg posterior -heel off step 15 sec x1 Rt Prone hang 2 mins prior to manual -heel prop X 5 min for extension stretch  Manual Prone extension stretching c overpressure, percussive device to hamstring in extension stretching.  Contact/relax to hamstring for extension.  Supine extension c ER mobilization c movement.    -Vaso R knee 10 minutes High  Pressure 34* post-treatment with knee extension prop   HOME EXERCISE PROGRAM: Access Code: [email or web address] URL: [email or web address] Date: 08/06/2021 Prepared by: Jamey Reas  Exercises - Supine Heel Slide with Strap  - 2-3 x daily - 7 x weekly - 2-3 sets - 10 reps - 5 seconds hold - Supine Quadriceps Stretch with Strap on Table  - 2-3 x daily - 7 x weekly - 1 sets - 2-3 reps - 20-30 seconds hold - Seated Knee Flexion Stretch  - 2-5 x daily - 7 x weekly - 2-3 sets - 10 reps - 5 sec hold - Seated Passive Knee Extension with Weight  - 2-5 x daily - 7 x weekly - 1 sets - 1 reps - 5-10 minutes hold - Seated Hamstring Stretch with Strap  - 2-4 x daily - 7 x weekly - 1 sets - 3 reps - 20-30 seconds hold - Sit to Stand with Counter Support  - 2-4 x daily - 7 x weekly - 2-3 sets - 10 reps - Gastroc Stretch on Step  - 2-4 x daily - 7 x weekly - 2-3 sets - 10 reps  - 20-30 seconds hold - Standing Hamstring Stretch with Step  - 1-2 x daily - 7 x weekly - 1 sets - 3-10 reps - 20-30 seconds hold - Standing Knee Flexion Stretch on Step  - 1-2 x daily - 7 x weekly - 1 sets - 3-10 reps - 20-30 seconds hold - Prone Knee Extension with Ankle Weight  - 1-2 x daily - 7 x weekly - 1 sets - 1 reps - 5-10 minutes hold - Prone Knee Flexion Extension AROM  - 1-2 x daily - 7 x weekly - 2 sets - 10 reps - 5 seconds hold - Prone Hip Internal Rotation AROM  - 1-2 x daily - 7 x weekly - 2 sets - 10 reps - 5 seconds hold - Prone Hip External Rotation AROM  - 1-2 x daily - 7 x weekly - 2 sets - 10 reps - 5 seconds hold - Prone Quadriceps Stretch with Strap  - 1-2 x daily - 7 x weekly - 1 sets - 3 reps - 20-30 seconds hold   ASSESSMENT:   CLINICAL IMPRESSION:   She does appear to be slowly improving in her ROM, we have emphasized extension ROM and gait lately. Pain levels appear to be improving but she is still limited by knee stiffness and will continue to benefit from PT.  OBJECTIVE IMPAIRMENTS Abnormal gait, decreased activity tolerance, decreased balance, decreased knowledge of condition, decreased knowledge of use of DME, decreased mobility, difficulty walking, decreased ROM, decreased strength, increased edema, increased muscle spasms, impaired flexibility, postural dysfunction, and pain.    ACTIVITY LIMITATIONS community activity, driving, and personal recreation like Yoga & walking dogs .    PERSONAL FACTORS 1-2 comorbidities: see PMH  are also affecting patient's functional outcome.    REHAB POTENTIAL: Good   CLINICAL DECISION MAKING: Stable/uncomplicated   EVALUATION COMPLEXITY: Low     GOALS: Goals reviewed with patient? Yes   SHORT TERM GOALS: Target date: 06/22/2021   Patient independent and verbalizes compliance with initial HEP Baseline: SEE OBJECTIVE DATA Goal status: Met 07/26/2021   2.  Patient reports 50% improvement in right knee pain. Baseline:  SEE OBJECTIVE DATA Goal status: on going 07/26/2021   3.  PROM right knee extension -3* to flexion 90* Baseline: SEE OBJECTIVE DATA Goal status: on going 05/29/2021   LONG TERM GOALS: Target date: 08/31/2021  Patient will improve FOTO score to 67% Baseline: SEE OBJECTIVE DATA Goal status:ongoing 08/03/2021   2.  Patient reports right knee pain </= 2/10 with standing & gait activities.  Baseline: SEE OBJECTIVE DATA Goal status: ongoing 08/03/2021   3.  Right Knee PROM 0* extension to 110* flexion Baseline: SEE OBJECTIVE DATA Goal status: ongoing 08/03/2021   4.  Right Knee AROM seated -3* extension to 100* flexion Baseline: SEE OBJECTIVE DATA Goal status: ongoing 08/03/2021   5.  Patient ambulates >500' community distances including negotiating ramps, curbs & stairs without device independently. Baseline: SEE OBJECTIVE DATA Goal status:ongoing 08/03/2021   6.  Patient reports ability to perform Yoga & walk her dogs. Baseline: SEE OBJECTIVE DATA Goal status: ongoing 08/03/2021   PLAN: PT FREQUENCY: 3x/week of manipulation, 4x/wk following week, 2-3xwk for 6 weeks   PT DURATION: 8 additional weeks   PLANNED INTERVENTIONS: Therapeutic exercises, Therapeutic activity, Neuromuscular re-education, Balance training, Gait training, Patient/Family education, Joint mobilization, Stair training, Vestibular training, DME instructions, Electrical stimulation, Cryotherapy, Moist heat, scar mobilization, Taping, Vasopneumatic device, physical performance testing and Manual therapy   PLAN FOR NEXT SESSION: Continue emphasis on knee extension ROM and active extension during functional activity, manual stretching at rest/standing with activity. Vaso at warmer temp or medium compression, limited by sensitivity. Will need recert next visit.  Elsie Ra, PT, DPT 08/10/21 10:40 AM

## 2021-08-13 NOTE — Therapy (Signed)
OUTPATIENT PHYSICAL THERAPY TREATMENT NOTE   Patient Name: Cheryl Tate MRN: [id number] DOB:1970-06-30, 51 y.o., female Today's Date: 08/13/2021   END OF SESSION:     Past Medical History:  Diagnosis Date   Anemia    Depression    Menorrhagia    Mood disorder ([place])    PONV (postoperative nausea and vomiting)    Von Willebrand's disease [facility])    followed by hemotologist Dr. Marin Olp   Past Surgical History:  Procedure Laterality Date   APPENDECTOMY  01/22/2004   DILATION AND CURETTAGE OF UTERUS  2002   x 2 in 2002   DILITATION & CURRETTAGE/HYSTROSCOPY WITH HYDROTHERMAL ABLATION N/A 11/22/2014   Procedure: HYSTEROSCOPY WITH HYDROTHERMAL ABLATION;  Surgeon: Dian Queen, MD;  Location: [place];  Service: Gynecology;  Laterality: N/A;   EXAM UNDER ANESTHESIA WITH MANIPULATION OF KNEE Right 07/12/2021   Procedure: RIGHT EXAM UNDER ANESTHESIA WITH MANIPULATION OF KNEE;  Surgeon: Meredith Pel, MD;  Location: [place];  Service: Orthopedics;  Laterality: Right;   HYSTEROSCOPY WITH D & C  08-08-2014  in [place]   MULTIPLE TOOTH EXTRACTIONS     TOTAL KNEE ARTHROPLASTY Right 05/22/2021   Procedure: RIGHT TOTAL KNEE ARTHROPLASTY;  Surgeon: Meredith Pel, MD;  Location: [place];  Service: Orthopedics;  Laterality: Right;   Patient Active Problem List   Diagnosis Date Noted   Fibrosis of right knee joint    Arthritis of right knee    S/P knee replacement 05/22/2021   BMI 33.0-33.9,adult 06/16/2015   Von Willebrand disease ([place]) 06/13/2015   Depression 12/29/2014   Rhinitis, allergic 12/28/2014    PCP: Eunice Blase, MD   REFERRING PROVIDER: Donella Stade, PA-C   REFERRING DIAG: [phone] (ICD-10-CM) - History of total knee arthroplasty, unspecified laterality  ONSET DATE: 05/22/2021 Right TKA   THERAPY DIAG:  No diagnosis found.  PERTINENT HISTORY: Von Willebrand disease, depression, Menorrhgia  PRECAUTIONS: None  SUBJECTIVE: *** She  relays she has been working hard at home on HEP, particularly extension.  Yoga?  PAIN:  NPRS scale: *** 1/10 this morning  Pain location: right knee all over.  Pain description: sharp, sore, cramps Aggravating factors: weather, stiffness in morning Relieving factors: ice, meds   OBJECTIVE:    PATIENT SURVEYS:  05/28/2021 FOTO 29% functional & target 67%   COGNITION: 05/28/2021 Overall cognitive status: Within functional limits for tasks assessed                EDEMA: 05/28/2021 RLE above knee 51.8cm around knee 48cm below knee  41.2cm LLE above knee 45.8cm around knee 40cm below knee 34.1cm   PALPATION: 05/28/2021 Bruising along medial knee & lower leg, tenderness over joint line, quads, hamstrings   LE ROM:   ROM P: Passive  A: Active Right 05/28/21 Right 05/31/21 Right 06/05/21 Right 06/06/21 Right 06/11/21 Right 06/14/21 Right 06/19/21 Right 06/26/21 Right 06/28/21 Right 07/02/21 Right 07/13/21 Right 07/19/21 Right 07/20/21 Right 07/23/2021 Right 07/27/21 Right 07/30/21 Right 08/06/21 Right 08/08/21  Hip flexion                     Hip extension                     Hip abduction                     Hip adduction  Hip internal rotation                     Hip external rotation                     Knee flexion Supine P: 64* Seated  A: 64* Supine AROM heel slide:61 PROM overpressure in heel slide : 66   Supine AA: 73* Seated  P: 75*  Seated P: 79* Seated P: 75 Seated P: 70 with muscle guarding limiting Seated P:78 after manual therapy Seated P: 75*  A: 64* after manual therapy Supine A 70 P 78 AA 84 (after exercise) Seated P:85 After maual therapy Seated P:93 after manual therapy Seated P: 97*  Seated P: 98* Seated A: 85* P: 100* After manual therapy   Knee extension Supine P: -10* Seated  A: -31* Seated AROM LAQ: -21 Seated LAQ A: -14* Supine P: -9* Supine A: -8 Supine P: -7* Supine A: -8 P:-6 Supine A:-8 P:-5  Seated P: -7* after  manual therapy Seated  A -16 (seated LAQ)   Supine P: -9* Supine  P:-9 after manual therapy Supine P: -8* after manual Supine A: -15* P: -9* after manual Standing A: -9* after some manual  Ankle dorsiflexion                     Ankle plantarflexion                     Ankle inversion                     Ankle eversion                      (Blank rows = not tested)   LE MMT:   MMT Right 05/28/2021 Left 05/28/2021 Right 08/03/2021  Hip flexion       Hip extension       Hip abduction       Hip adduction       Hip internal rotation       Hip external rotation       Knee flexion 2/5  4/5  Knee extension 2/5    Ankle dorsiflexion       Ankle plantarflexion       Ankle inversion       Ankle eversion        (Blank rows = not tested)   GAIT: 08/03/2021  SPC use into clinic, reduced extension throughout gait cycle.   05/28/2021 Distance walked: 65' Assistive device utilized:  4 footed walker level of assistance: SBA  verbal cues needed Comments: antalgic, right knee flexed in stance, limited to no increase flexion for swing, RLE abducted,    Functional Activities: Pt using LLE to move RLE in & out of bed.  PT min guard RLE and she was able to move RLE without assisting with LLE.    TODAY'S TREATMENT: 08/14/21 ***  08/10/21 TherEx -Sci fit bike X9 min for knee ROM -Leg press DL 75# moving into max flexion holding 5 seconds then max extension holding 5 sec 2 x 20 reps, RLE 37# 5s hold 1 x 10 reps -Standing gastroc stretch 3 X 30 seconds -March walking with focus on TKE during swing phase of contralateral limb 3 round trips at counter -Retro walking large steps with focus on TKE 3 round trips  Manual -Knee extension grade 3-4 mobilizations  in supine -Knee  extension PROM with overpressure flexion and extension (emphasis on extension)   Vaso R knee 10 minutes High Pressure 34* post-treatment with knee extension prop for 3 min  08/07/21 TherEx -Leg press DL 75# moving into max  flexion holding 5 seconds then max extension holding 5 sec 2 x 20 reps, RLE 37# 5s hold 1 x 10 reps -Seated LAQ with strap for terminal knee ext 1 x 15 reps. SPT demo, verbal & tactile cues to use strap for increasing active range.  -Seated AAROM knee flexion using LLE to flex RLE  Manual -Knee extension grade 3 mobilizations long sitting -Knee extension grade 3 mobilizations and PROM supine with towel roll prop -Knee extension belted mobilizations during standing yoga poses: downward dog, warrior 2, triangle, tree pose. PT cueing to use proper Yoga form grounding through LEs.  PT recommended trying standing poses at home near counter or support for safety.  Pt verbalized understanding.    Vaso R knee 6 minutes High Pressure 34* post-treatment with knee extension prop for 5 min, pt reported high sensitivity to cold and asked to stop  08/06/21 Therex -Sci fit bike seat 7 level 2 full revolutions X 11 min with verbal cueing for full straightening and bending. PT cued for full ext on push out & flex on bend requiring frequent reminders.  -Standing RLE knee flexion stretch and knee extension hamstring stretch on 8" step 3 x 20s each -RLE step up over and down on 6" step in //bars, verbal cues for knee extension - x4 with BUE support, x2 with fingertip touch support, x4 with no UE support, verbal cues on knee ext upon arising.  -PT reviewed updated HEP (see below for updated version) with verbal explanation and handout, pt verbalized understanding  Manual -Standing knee extension grade 3 mobs with belt and gastroc stretch 2x10 reps -Seated knee flexion PROM with overpressure and tibial IR/distraction to tolerance -Supine knee extension A-P grade 3-4 mobs  Vaso R knee 10 minutes High Pressure 34* post-treatment with knee extension prop for 8 min  08/03/21 Therex -Sci fit bike seat 6-7 full revolutions X 8 min -Leg press DL 75# moving into max flexion and holding 5 seconds then max extension and  holding 5 sec 2 x 15 -Runner stretch 15 sec x 5 c Rt leg posterior -heel off step 15 sec x1 Rt Prone hang 2 mins prior to manual -heel prop X 5 min for extension stretch  Manual Prone extension stretching c overpressure, percussive device to hamstring in extension stretching.  Contact/relax to hamstring for extension.  Supine extension c ER mobilization c movement.    -Vaso R knee 10 minutes High Pressure 34* post-treatment with knee extension prop   HOME EXERCISE PROGRAM: Access Code: [email or web address] URL: [email or web address] Date: 08/06/2021 Prepared by: Jamey Reas  Exercises - Supine Heel Slide with Strap  - 2-3 x daily - 7 x weekly - 2-3 sets - 10 reps - 5 seconds hold - Supine Quadriceps Stretch with Strap on Table  - 2-3 x daily - 7 x weekly - 1 sets - 2-3 reps - 20-30 seconds hold - Seated Knee Flexion Stretch  - 2-5 x daily - 7 x weekly - 2-3 sets - 10 reps - 5 sec hold - Seated Passive Knee Extension with Weight  - 2-5 x daily - 7 x weekly - 1 sets - 1 reps - 5-10 minutes hold - Seated Hamstring Stretch with Strap  - 2-4 x daily - 7 x weekly - 1  sets - 3 reps - 20-30 seconds hold - Sit to Stand with Counter Support  - 2-4 x daily - 7 x weekly - 2-3 sets - 10 reps - Gastroc Stretch on Step  - 2-4 x daily - 7 x weekly - 2-3 sets - 10 reps - 20-30 seconds hold - Standing Hamstring Stretch with Step  - 1-2 x daily - 7 x weekly - 1 sets - 3-10 reps - 20-30 seconds hold - Standing Knee Flexion Stretch on Step  - 1-2 x daily - 7 x weekly - 1 sets - 3-10 reps - 20-30 seconds hold - Prone Knee Extension with Ankle Weight  - 1-2 x daily - 7 x weekly - 1 sets - 1 reps - 5-10 minutes hold - Prone Knee Flexion Extension AROM  - 1-2 x daily - 7 x weekly - 2 sets - 10 reps - 5 seconds hold - Prone Hip Internal Rotation AROM  - 1-2 x daily - 7 x weekly - 2 sets - 10 reps - 5 seconds hold - Prone Hip External Rotation AROM  - 1-2 x daily - 7 x weekly - 2 sets - 10 reps - 5 seconds  hold - Prone Quadriceps Stretch with Strap  - 1-2 x daily - 7 x weekly - 1 sets - 3 reps - 20-30 seconds hold   ASSESSMENT:   CLINICAL IMPRESSION:   *** She does appear to be slowly improving in her ROM, we have emphasized extension ROM and gait lately. Pain levels appear to be improving but she is still limited by knee stiffness and will continue to benefit from PT.  OBJECTIVE IMPAIRMENTS Abnormal gait, decreased activity tolerance, decreased balance, decreased knowledge of condition, decreased knowledge of use of DME, decreased mobility, difficulty walking, decreased ROM, decreased strength, increased edema, increased muscle spasms, impaired flexibility, postural dysfunction, and pain.    ACTIVITY LIMITATIONS community activity, driving, and personal recreation like Yoga & walking dogs .    PERSONAL FACTORS 1-2 comorbidities: see PMH  are also affecting patient's functional outcome.    REHAB POTENTIAL: Good   CLINICAL DECISION MAKING: Stable/uncomplicated   EVALUATION COMPLEXITY: Low     GOALS: Goals reviewed with patient? Yes   SHORT TERM GOALS: Target date: 06/22/2021   Patient independent and verbalizes compliance with initial HEP Baseline: SEE OBJECTIVE DATA Goal status: Met 07/26/2021   2.  Patient reports 50% improvement in right knee pain. Baseline: SEE OBJECTIVE DATA Goal status: on going 07/26/2021   3.  PROM right knee extension -3* to flexion 90* Baseline: SEE OBJECTIVE DATA Goal status: on going 05/29/2021   LONG TERM GOALS: Target date: 08/31/2021   Patient will improve FOTO score to 67% Baseline: SEE OBJECTIVE DATA Goal status:ongoing 08/03/2021   2.  Patient reports right knee pain </= 2/10 with standing & gait activities.  Baseline: SEE OBJECTIVE DATA Goal status: ongoing 08/03/2021   3.  Right Knee PROM 0* extension to 110* flexion Baseline: SEE OBJECTIVE DATA Goal status: ongoing 08/03/2021   4.  Right Knee AROM seated -3* extension to 100*  flexion Baseline: SEE OBJECTIVE DATA Goal status: ongoing 08/03/2021   5.  Patient ambulates >500' community distances including negotiating ramps, curbs & stairs without device independently. Baseline: SEE OBJECTIVE DATA Goal status:ongoing 08/03/2021   6.  Patient reports ability to perform Yoga & walk her dogs. Baseline: SEE OBJECTIVE DATA Goal status: ongoing 08/03/2021   PLAN: PT FREQUENCY: 3x/week of manipulation, 4x/wk following week, 2-3xwk for 6  weeks   PT DURATION: 8 additional weeks   PLANNED INTERVENTIONS: Therapeutic exercises, Therapeutic activity, Neuromuscular re-education, Balance training, Gait training, Patient/Family education, Joint mobilization, Stair training, Vestibular training, DME instructions, Electrical stimulation, Cryotherapy, Moist heat, scar mobilization, Taping, Vasopneumatic device, physical performance testing and Manual therapy   PLAN FOR NEXT SESSION: ***Continue emphasis on knee extension ROM and active extension during functional activity, manual stretching at rest/standing with activity. Vaso at warmer temp or medium compression, limited by sensitivity. Will need recert next visit.  Jana Hakim, SPT 08/14/21, 12:25 PM  This entire session of physical therapy was performed under the direct supervision of PT signing evaluation /treatment. PT reviewed note and agrees.  Jamey Reas, PT, DPT 08/13/21 3:46 PM

## 2021-08-14 ENCOUNTER — Ambulatory Visit (INDEPENDENT_AMBULATORY_CARE_PROVIDER_SITE_OTHER): Payer: No Typology Code available for payment source | Admitting: Physical Therapy

## 2021-08-14 DIAGNOSIS — R600 Localized edema: Secondary | ICD-10-CM

## 2021-08-14 DIAGNOSIS — M25661 Stiffness of right knee, not elsewhere classified: Secondary | ICD-10-CM

## 2021-08-14 DIAGNOSIS — R2689 Other abnormalities of gait and mobility: Secondary | ICD-10-CM | POA: Diagnosis not present

## 2021-08-14 DIAGNOSIS — M25561 Pain in right knee: Secondary | ICD-10-CM | POA: Diagnosis not present

## 2021-08-14 DIAGNOSIS — M6281 Muscle weakness (generalized): Secondary | ICD-10-CM

## 2021-08-14 DIAGNOSIS — R2681 Unsteadiness on feet: Secondary | ICD-10-CM

## 2021-08-15 NOTE — Therapy (Signed)
OUTPATIENT PHYSICAL THERAPY TREATMENT NOTE   Patient Name: Cheryl Tate MRN: [id number] DOB:03/17/1970, 51 y.o., female Today's Date: 08/16/2021   END OF SESSION:   PT End of Session - 08/16/21 0930     Visit Number 37    Number of Visits 48    Date for PT Re-Evaluation 09/21/21    Authorization Type [employer]    Authorization Time Period [facility] CHOICE  NO VISIT LIMIT, PATIENT PAYS 100% UNTIL DED IS MET, DED $7500 [id number] MET, TERM DATE 09/19/2021    Progress Note Due on Visit 76    PT Start Time 0929    PT Stop Time 1026    PT Time Calculation (min) 57 min    Activity Tolerance Patient tolerated treatment well    Behavior During Therapy WFL for tasks assessed/performed               Past Medical History:  Diagnosis Date   Anemia    Depression    Menorrhagia    Mood disorder (HCC)    PONV (postoperative nausea and vomiting)    Von Willebrand's disease ([place])    followed by hemotologist Dr. Marin Olp   Past Surgical History:  Procedure Laterality Date   APPENDECTOMY  01/22/2004   DILATION AND CURETTAGE OF UTERUS  2002   x 2 in 2002   DILITATION & CURRETTAGE/HYSTROSCOPY WITH HYDROTHERMAL ABLATION N/A 11/22/2014   Procedure: HYSTEROSCOPY WITH HYDROTHERMAL ABLATION;  Surgeon: Dian Queen, MD;  Location: [place];  Service: Gynecology;  Laterality: N/A;   EXAM UNDER ANESTHESIA WITH MANIPULATION OF KNEE Right 07/12/2021   Procedure: RIGHT EXAM UNDER ANESTHESIA WITH MANIPULATION OF KNEE;  Surgeon: Meredith Pel, MD;  Location: [place];  Service: Orthopedics;  Laterality: Right;   HYSTEROSCOPY WITH D & C  08-08-2014  in [place]   MULTIPLE TOOTH EXTRACTIONS     TOTAL KNEE ARTHROPLASTY Right 05/22/2021   Procedure: RIGHT TOTAL KNEE ARTHROPLASTY;  Surgeon: Meredith Pel, MD;  Location: [place];  Service: Orthopedics;  Laterality: Right;   Patient Active Problem List   Diagnosis Date Noted   Fibrosis of right knee joint    Arthritis of right knee     S/P knee replacement 05/22/2021   BMI 33.0-33.9,adult 06/16/2015   Von Willebrand disease ([place]) 06/13/2015   Depression 12/29/2014   Rhinitis, allergic 12/28/2014    PCP: Eunice Blase, MD   REFERRING PROVIDER: Donella Stade, PA-C   REFERRING DIAG: [phone] (ICD-10-CM) - History of total knee arthroplasty, unspecified laterality  ONSET DATE: 05/22/2021 Right TKA   THERAPY DIAG:  Stiffness of right knee, not elsewhere classified  Muscle weakness (generalized)  Acute pain of right knee  Other abnormalities of gait and mobility  Localized edema  Unsteadiness on feet  PERTINENT HISTORY: Von Willebrand disease, depression, Menorrhgia  PRECAUTIONS: None  SUBJECTIVE: She continues to do the exercises at home mixing flexion and extension exercises. She biked in the morning prior to PT appointment. She noted a change in pain/tightness location to the lateral and posterolateral knee, previously pain was more on the medial side.  PAIN:  NPRS scale: 1-2/10 currently Pain location: right knee all over.  Pain description: sharp, sore, cramps Aggravating factors: weather, stiffness in morning Relieving factors: ice, meds   OBJECTIVE:    PATIENT SURVEYS:  05/28/2021 FOTO 29% functional & target 67% 08/14/2021 FOTO 71%   COGNITION: 05/28/2021 Overall cognitive status: Within functional limits for tasks assessed  EDEMA: 05/28/2021 RLE above knee 51.8cm around knee 48cm below knee  41.2cm LLE above knee 45.8cm around knee 40cm below knee 34.1cm   PALPATION: 05/28/2021 Bruising along medial knee & lower leg, tenderness over joint line, quads, hamstrings   LE ROM:   ROM P: Passive  A: Active Right 05/28/21 Right 05/31/21 Right 06/05/21 Right 06/06/21 Right 06/11/21 Right 06/14/21 Right 06/19/21 Right 06/26/21 Right 06/28/21 Right 07/02/21 Right 07/13/21 Right 07/19/21 Right 07/20/21 Right 07/23/2021 Right 07/27/21 Right 07/30/21 Right 08/06/21 Right 08/08/21 Right 08/14/21  Right  08/16/21  Hip flexion                       Hip extension                       Hip abduction                       Hip adduction                       Hip internal rotation                       Hip external rotation                       Knee flexion Supine P: 64* Seated  A: 64* Supine AROM heel slide:61 PROM overpressure in heel slide : 66   Supine AA: 73* Seated  P: 75*  Seated P: 79* Seated P: 75 Seated P: 70 with muscle guarding limiting Seated P:78 after manual therapy Seated P: 75*  A: 64* after manual therapy Supine A 70 P 78 AA 84 (after exercise) Seated P:85 After maual therapy Seated P:93 after manual therapy Seated P: 97*  Seated P: 98* Seated A: 85* P: 100* After manual therapy  Supine A: 94* P: 98* Seated A: 94* P: 105* Seated  P: 105* After manual  Knee extension Supine P: -10* Seated  A: -31* Seated AROM LAQ: -21 Seated LAQ A: -14* Supine P: -9* Supine A: -8 Supine P: -7* Supine A: -8 P:-6 Supine A:-8 P:-5  Seated P: -7* after manual therapy Seated  A -16 (seated LAQ)   Supine P: -9* Supine  P:-9 after manual therapy Supine P: -8* after manual Supine A: -15* P: -9* after manual Standing A: -9* after some manual Seated LAQ A: -15* Supine A: -13* P: -4*   Ankle dorsiflexion                       Ankle plantarflexion                       Ankle inversion                       Ankle eversion                        (Blank rows = not tested)   LE MMT:   MMT Right 05/28/2021 Left 05/28/2021 Right 08/03/2021  Hip flexion       Hip extension       Hip abduction       Hip adduction       Hip internal rotation       Hip external rotation  Knee flexion 2/5  4/5  Knee extension 2/5    Ankle dorsiflexion       Ankle plantarflexion       Ankle inversion       Ankle eversion        (Blank rows = not tested)   GAIT: 08/16/2021 no AD with improved Rt knee extension in stance and Rt knee flexion in swing  Pivot turns  present with reduced Rt knee extension  08/03/2021  SPC use into clinic, reduced extension throughout gait cycle.   05/28/2021 Distance walked: 26' Assistive device utilized:  4 footed walker level of assistance: SBA  verbal cues needed Comments: antalgic, right knee flexed in stance, limited to no increase flexion for swing, RLE abducted,    Functional Activities: Pt using LLE to move RLE in & out of bed.  PT min guard RLE and she was able to move RLE without assisting with LLE.    TODAY'S TREATMENT: 08/16/21 TherEx -Leg press 45* DL 81# moving into max flexion holding 5 seconds then max extension holding 5 sec 1 x 20 reps, Back flat DL 81# 5s flex/ext hold 1 x 15 reps, RLE 37# 5s hold 1 x 15 reps - verbal and tactile cues for knee extension  TherAct -PT verbal cues for Rt knee extension in stance including in turns with demo, pt returned demo  Manual -IASTM in knee flexion and extension to medial and lateral hamstring tendons, joint line ligaments - PT palpated less tightness in hamstring tendons following and pt reported less pain with active and passive knee flexion to end range -Knee flexion PROM to end range seated and supine hip extended with knee over edge of table  Vaso R knee 10 minutes High Pressure 34* post-treatment with knee extension prop for 8 min  08/14/21 TherEx -Scifit bike x 8 min seat 7 level 2 - BLE/BUE 4 min and BLEs only 4 min -Seated LAQ with active knee flexion and contralateral motion x 15 -Supine quad stretch with strap 2 x 30s -Supine heel prop towel roll quad sets 2 x 10 reps -Seated AROM knee flexion with 5s hold x 10 reps -PT education for plan at gym with appropriate warm up and cool down, arm exercise for break between leg exercises, doing both BLE and single LE exercise, and continuing yoga - pt verbalized understanding   08/10/21 TherEx -Sci fit bike X9 min for knee ROM -Leg press DL 75# moving into max flexion holding 5 seconds then max extension  holding 5 sec 2 x 20 reps, RLE 37# 5s hold 1 x 10 reps -Standing gastroc stretch 3 X 30 seconds -March walking with focus on TKE during swing phase of contralateral limb 3 round trips at counter -Retro walking large steps with focus on TKE 3 round trips  Manual -Knee extension grade 3-4 mobilizations  in supine -Knee extension PROM with overpressure flexion and extension (emphasis on extension)  Vaso R knee 10 minutes High Pressure 34* post-treatment with knee extension prop for 3 min  08/07/21 TherEx -Leg press DL 75# moving into max flexion holding 5 seconds then max extension holding 5 sec 2 x 20 reps, RLE 37# 5s hold 1 x 10 reps -Seated LAQ with strap for terminal knee ext 1 x 15 reps. SPT demo, verbal & tactile cues to use strap for increasing active range.  -Seated AAROM knee flexion using LLE to flex RLE  Manual -Knee extension grade 3 mobilizations long sitting -Knee extension grade 3 mobilizations  and PROM supine with towel roll prop -Knee extension belted mobilizations during standing yoga poses: downward dog, warrior 2, triangle, tree pose. PT cueing to use proper Yoga form grounding through LEs.  PT recommended trying standing poses at home near counter or support for safety.  Pt verbalized understanding.    Vaso R knee 6 minutes High Pressure 34* post-treatment with knee extension prop for 5 min, pt reported high sensitivity to cold and asked to stop   HOME EXERCISE PROGRAM: Access Code: [email or web address] URL: [email or web address] Date: 08/06/2021 Prepared by: Jamey Reas  Exercises - Supine Heel Slide with Strap  - 2-3 x daily - 7 x weekly - 2-3 sets - 10 reps - 5 seconds hold - Supine Quadriceps Stretch with Strap on Table  - 2-3 x daily - 7 x weekly - 1 sets - 2-3 reps - 20-30 seconds hold - Seated Knee Flexion Stretch  - 2-5 x daily - 7 x weekly - 2-3 sets - 10 reps - 5 sec hold - Seated Passive Knee Extension with Weight  - 2-5 x daily - 7 x weekly - 1 sets  - 1 reps - 5-10 minutes hold - Seated Hamstring Stretch with Strap  - 2-4 x daily - 7 x weekly - 1 sets - 3 reps - 20-30 seconds hold - Sit to Stand with Counter Support  - 2-4 x daily - 7 x weekly - 2-3 sets - 10 reps - Gastroc Stretch on Step  - 2-4 x daily - 7 x weekly - 2-3 sets - 10 reps - 20-30 seconds hold - Standing Hamstring Stretch with Step  - 1-2 x daily - 7 x weekly - 1 sets - 3-10 reps - 20-30 seconds hold - Standing Knee Flexion Stretch on Step  - 1-2 x daily - 7 x weekly - 1 sets - 3-10 reps - 20-30 seconds hold - Prone Knee Extension with Ankle Weight  - 1-2 x daily - 7 x weekly - 1 sets - 1 reps - 5-10 minutes hold - Prone Knee Flexion Extension AROM  - 1-2 x daily - 7 x weekly - 2 sets - 10 reps - 5 seconds hold - Prone Hip Internal Rotation AROM  - 1-2 x daily - 7 x weekly - 2 sets - 10 reps - 5 seconds hold - Prone Hip External Rotation AROM  - 1-2 x daily - 7 x weekly - 2 sets - 10 reps - 5 seconds hold - Prone Quadriceps Stretch with Strap  - 1-2 x daily - 7 x weekly - 1 sets - 3 reps - 20-30 seconds hold   ASSESSMENT:   CLINICAL IMPRESSION:   She is presenting with improved gait mechanics with improved knee extension in stance and knee flexion in swing, requiring verbal cueing for knee extension with pivot turns. IASTM improved subjective pain reporting, even during manual knee flexion to end range. She continues to benefit from quadriceps strengthening and range of motion mobility during open and closed chain activity. She continues to benefit from skilled PT.  OBJECTIVE IMPAIRMENTS Abnormal gait, decreased activity tolerance, decreased balance, decreased knowledge of condition, decreased knowledge of use of DME, decreased mobility, difficulty walking, decreased ROM, decreased strength, increased edema, increased muscle spasms, impaired flexibility, postural dysfunction, and pain.    ACTIVITY LIMITATIONS community activity, driving, and personal recreation like Yoga &  walking dogs .    PERSONAL FACTORS 1-2 comorbidities: see PMH  are also affecting patient's functional outcome.    REHAB POTENTIAL:  Good   CLINICAL DECISION MAKING: Stable/uncomplicated   EVALUATION COMPLEXITY: Low     GOALS: Goals reviewed with patient? Yes  SHORT TERM GOALS: Target date: 08/31/2021  Patient independent with updated HEP, including exercise at gym and with progression to yoga. Baseline: SEE OBJECTIVE DATA Goal status: NEW 08/14/2021  2.  Right Knee AROM seated LAQ to -6* extension Baseline: SEE OBJECTIVE DATA Goal status: NEW 08/14/2021   3.  PROM right knee extension -3* to flexion 90* Baseline: SEE OBJECTIVE DATA Goal status: on going 08/14/2021  LONG TERM GOALS: Target date: 09/21/2021   Patient will improve FOTO score to 67% Baseline: SEE OBJECTIVE DATA Goal status: MET 08/14/2021   2.  Patient reports right knee pain </= 2/10 with standing & gait activities.  Baseline: SEE OBJECTIVE DATA Goal status: ongoing 08/14/2021   3.  Right Knee PROM 0* extension to 110* flexion Baseline: SEE OBJECTIVE DATA Goal status: ongoing 08/14/2021   4.  Right Knee AROM seated -3* extension to 100* flexion Baseline: SEE OBJECTIVE DATA Goal status: ongoing 08/14/2021   5.  Patient ambulates >500' community distances including negotiating ramps, curbs & stairs without device independently. Baseline: SEE OBJECTIVE DATA Goal status:ongoing 08/14/2021   6.  Patient reports ability to perform Yoga & walk her dogs. Baseline: SEE OBJECTIVE DATA Goal status: ongoing 08/14/2021   PLAN: PT FREQUENCY: 2x/week   PT DURATION: 6 additional weeks   PLANNED INTERVENTIONS: Therapeutic exercises, Therapeutic activity, Neuromuscular re-education, Balance training, Gait training, Patient/Family education, Joint mobilization, Stair training, Aquatic training, DME instructions, Electrical stimulation, Cryotherapy, Moist heat, scar mobilization, Taping, Vasopneumatic device, physical  performance testing and Manual therapy   PLAN FOR NEXT SESSION: Continue emphasis on knee extension ROM and active extension during functional activity, manual stretching at rest/standing with activity. Alternating knee flexion and extension activity, including leg press with hip extension for mobility/strengthening. Vaso at end  [facility], SPT 08/16/21, 11:28 AM  This entire session of physical therapy was performed under the direct supervision of PT signing evaluation /treatment. PT reviewed note and agrees.  Jamey Reas, PT, DPT 08/16/21 11:30 AM

## 2021-08-16 ENCOUNTER — Ambulatory Visit (INDEPENDENT_AMBULATORY_CARE_PROVIDER_SITE_OTHER): Payer: No Typology Code available for payment source | Admitting: Physical Therapy

## 2021-08-16 ENCOUNTER — Encounter: Payer: Self-pay | Admitting: Physical Therapy

## 2021-08-16 ENCOUNTER — Other Ambulatory Visit: Payer: Self-pay

## 2021-08-16 DIAGNOSIS — M25661 Stiffness of right knee, not elsewhere classified: Secondary | ICD-10-CM | POA: Diagnosis not present

## 2021-08-16 DIAGNOSIS — M25561 Pain in right knee: Secondary | ICD-10-CM

## 2021-08-16 DIAGNOSIS — M6281 Muscle weakness (generalized): Secondary | ICD-10-CM | POA: Diagnosis not present

## 2021-08-16 DIAGNOSIS — R600 Localized edema: Secondary | ICD-10-CM

## 2021-08-16 DIAGNOSIS — R2689 Other abnormalities of gait and mobility: Secondary | ICD-10-CM

## 2021-08-16 DIAGNOSIS — R2681 Unsteadiness on feet: Secondary | ICD-10-CM

## 2021-08-16 MED ORDER — GABAPENTIN 300 MG PO CAPS: 300.0000 mg | ORAL_CAPSULE | Freq: Three times a day (TID) | ORAL | 0 refills | Status: AC

## 2021-08-20 NOTE — Therapy (Signed)
OUTPATIENT PHYSICAL THERAPY TREATMENT NOTE   Patient Name: Cheryl Tate MRN: [id number] DOB:[birth year], 51 y.o., female Today's Date: 08/21/2021   END OF SESSION:   PT End of Session - 08/21/21 1027     Visit Number 38    Number of Visits 48    Date for PT Re-Evaluation 09/21/21    Authorization Type [employer]    Authorization Time Period [facility] CHOICE  NO VISIT LIMIT, PATIENT PAYS 100% UNTIL DED IS MET, DED $7500 [id number] MET, TERM DATE 09/19/2021    Progress Note Due on Visit 70    PT Start Time 0932    PT Stop Time 1015    PT Time Calculation (min) 43 min    Activity Tolerance Patient tolerated treatment well    Behavior During Therapy WFL for tasks assessed/performed                Past Medical History:  Diagnosis Date   Anemia    Depression    Menorrhagia    Mood disorder (HCC)    PONV (postoperative nausea and vomiting)    Von Willebrand's disease ([place])    followed by hemotologist Dr. Marin Olp   Past Surgical History:  Procedure Laterality Date   APPENDECTOMY  01/22/2004   DILATION AND CURETTAGE OF UTERUS  2002   x 2 in 2002   DILITATION & CURRETTAGE/HYSTROSCOPY WITH HYDROTHERMAL ABLATION N/A 11/22/2014   Procedure: HYSTEROSCOPY WITH HYDROTHERMAL ABLATION;  Surgeon: Dian Queen, MD;  Location: [place];  Service: Gynecology;  Laterality: N/A;   EXAM UNDER ANESTHESIA WITH MANIPULATION OF KNEE Right 07/12/2021   Procedure: RIGHT EXAM UNDER ANESTHESIA WITH MANIPULATION OF KNEE;  Surgeon: Meredith Pel, MD;  Location: [place];  Service: Orthopedics;  Laterality: Right;   HYSTEROSCOPY WITH D & C  08-08-2014  in [place]   MULTIPLE TOOTH EXTRACTIONS     TOTAL KNEE ARTHROPLASTY Right 05/22/2021   Procedure: RIGHT TOTAL KNEE ARTHROPLASTY;  Surgeon: Meredith Pel, MD;  Location: [place];  Service: Orthopedics;  Laterality: Right;   Patient Active Problem List   Diagnosis Date Noted   Fibrosis of right knee joint    Arthritis of right knee     S/P knee replacement 05/22/2021   BMI 33.0-33.9,adult 06/16/2015   Von Willebrand disease ([place]) 06/13/2015   Depression 12/29/2014   Rhinitis, allergic 12/28/2014    PCP: Eunice Blase, MD   REFERRING PROVIDER: Donella Stade, PA-C   REFERRING DIAG: [phone] (ICD-10-CM) - History of total knee arthroplasty, unspecified laterality  ONSET DATE: 05/22/2021 Right TKA   THERAPY DIAG:  Stiffness of right knee, not elsewhere classified  Muscle weakness (generalized)  Acute pain of right knee  Other abnormalities of gait and mobility  Localized edema  Unsteadiness on feet  PERTINENT HISTORY: Von Willebrand disease, depression, Menorrhgia  PRECAUTIONS: None  SUBJECTIVE: [facility] seated elliptical, leg press, and arm exercises. She has been in and out of the hospital for her father this weekend. She has some increased stiffness over the weekend. She may be changing insurance on 9/1  PAIN:  NPRS scale: 0/10 currently, up to 1/10 over weekend Pain location: right knee all over.  Pain description: sharp, sore, cramps Aggravating factors: weather, stiffness in morning Relieving factors: ice, meds   OBJECTIVE:    PATIENT SURVEYS:  05/28/2021 FOTO 29% functional & target 67% 08/14/2021 FOTO 71%   COGNITION: 05/28/2021 Overall cognitive status: Within functional limits for tasks assessed  EDEMA: 05/28/2021 RLE above knee 51.8cm around knee 48cm below knee  41.2cm LLE above knee 45.8cm around knee 40cm below knee 34.1cm   PALPATION: 05/28/2021 Bruising along medial knee & lower leg, tenderness over joint line, quads, hamstrings   LE ROM:   ROM P: Passive  A: Active Right 05/28/21 Right 05/31/21 Right 06/05/21 Right 06/06/21 Right 06/11/21 Right 06/14/21 Right 06/19/21 Right 06/26/21 Right 06/28/21 Right 07/02/21 Right 07/13/21 Right 07/19/21 Right 07/20/21 Right 07/23/2021 Right 07/27/21 Right 07/30/21 Right 08/06/21 Right 08/08/21 Right 08/14/21  Right  08/16/21  Hip flexion                       Hip extension                       Hip abduction                       Hip adduction                       Hip internal rotation                       Hip external rotation                       Knee flexion Supine P: 64* Seated  A: 64* Supine AROM heel slide:61 PROM overpressure in heel slide : 66   Supine AA: 73* Seated  P: 75*  Seated P: 79* Seated P: 75 Seated P: 70 with muscle guarding limiting Seated P:78 after manual therapy Seated P: 75*  A: 64* after manual therapy Supine A 70 P 78 AA 84 (after exercise) Seated P:85 After maual therapy Seated P:93 after manual therapy Seated P: 97*  Seated P: 98* Seated A: 85* P: 100* After manual therapy  Supine A: 94* P: 98* Seated A: 94* P: 105* Seated  P: 105* After manual  Knee extension Supine P: -10* Seated  A: -31* Seated AROM LAQ: -21 Seated LAQ A: -14* Supine P: -9* Supine A: -8 Supine P: -7* Supine A: -8 P:-6 Supine A:-8 P:-5  Seated P: -7* after manual therapy Seated  A -16 (seated LAQ)   Supine P: -9* Supine  P:-9 after manual therapy Supine P: -8* after manual Supine A: -15* P: -9* after manual Standing A: -9* after some manual Seated LAQ A: -15* Supine A: -13* P: -4*   Ankle dorsiflexion                       Ankle plantarflexion                       Ankle inversion                       Ankle eversion                        (Blank rows = not tested)   LE MMT:   MMT Right 05/28/2021 Left 05/28/2021 Right 08/03/2021  Hip flexion       Hip extension       Hip abduction       Hip adduction       Hip internal rotation       Hip external rotation  Knee flexion 2/5  4/5  Knee extension 2/5    Ankle dorsiflexion       Ankle plantarflexion       Ankle inversion       Ankle eversion        (Blank rows = not tested)   GAIT: 08/16/2021 no AD with improved Rt knee extension in stance and Rt knee flexion in swing  Pivot turns  present with reduced Rt knee extension  08/03/2021  SPC use into clinic, reduced extension throughout gait cycle.   05/28/2021 Distance walked: 29' Assistive device utilized:  4 footed walker level of assistance: SBA  verbal cues needed Comments: antalgic, right knee flexed in stance, limited to no increase flexion for swing, RLE abducted,    Functional Activities: Pt using LLE to move RLE in & out of bed.  PT min guard RLE and she was able to move RLE without assisting with LLE.    TODAY'S TREATMENT: 08/21/21 TherEx -Leg press back 45* DL 81# moving into max flexion holding 5 seconds then max extension holding 5 sec 1 x 20 reps, Back flat DL 81# 5s flex/ext hold 1 x 15 reps, RLE 37# 5s hold 1 x 15 reps - PT manual assist for extension range -Standing calf stretch on incline 2 x 30s -Standing calf raises 2 x 10 reps -Standing quad stretch 2 x 30s -Seated leg extension machine BLE 20# x 15 reps, BLE concentric with RLE 10# x 15 reps with 3s hold and slow eccentric -Seated leg curl BLE 35# x 15 reps, RLE 15# x 15 reps -PT education for leg machines at gym, weight indications, exercise pacing, pt verbalized understanding  Manual -Knee extension grade 3 mobs with strap during standing calf stretch 2 x 12  08/16/21 TherEx -Leg press 45* DL 81# moving into max flexion holding 5 seconds then max extension holding 5 sec 1 x 20 reps, Back flat DL 81# 5s flex/ext hold 1 x 15 reps, RLE 37# 5s hold 1 x 15 reps - verbal and tactile cues for knee extension  TherAct -PT verbal cues for Rt knee extension in stance including in turns with demo, pt returned demo  Manual -IASTM in knee flexion and extension to medial and lateral hamstring tendons, joint line ligaments - PT palpated less tightness in hamstring tendons following and pt reported less pain with active and passive knee flexion to end range -Knee flexion PROM to end range seated and supine hip extended with knee over edge of table  Vaso R knee  10 minutes High Pressure 34* post-treatment with knee extension prop for 8 min  08/14/21 TherEx -Scifit bike x 8 min seat 7 level 2 - BLE/BUE 4 min and BLEs only 4 min -Seated LAQ with active knee flexion and contralateral motion x 15 -Supine quad stretch with strap 2 x 30s -Supine heel prop towel roll quad sets 2 x 10 reps -Seated AROM knee flexion with 5s hold x 10 reps -PT education for plan at gym with appropriate warm up and cool down, arm exercise for break between leg exercises, doing both BLE and single LE exercise, and continuing yoga - pt verbalized understanding   HOME EXERCISE PROGRAM: Access Code: [email or web address] URL: [email or web address] Date: 08/06/2021 Prepared by: Jamey Reas  Exercises - Supine Heel Slide with Strap  - 2-3 x daily - 7 x weekly - 2-3 sets - 10 reps - 5 seconds hold - Supine Quadriceps Stretch with Strap on Table  - 2-3 x daily -  7 x weekly - 1 sets - 2-3 reps - 20-30 seconds hold - Seated Knee Flexion Stretch  - 2-5 x daily - 7 x weekly - 2-3 sets - 10 reps - 5 sec hold - Seated Passive Knee Extension with Weight  - 2-5 x daily - 7 x weekly - 1 sets - 1 reps - 5-10 minutes hold - Seated Hamstring Stretch with Strap  - 2-4 x daily - 7 x weekly - 1 sets - 3 reps - 20-30 seconds hold - Sit to Stand with Counter Support  - 2-4 x daily - 7 x weekly - 2-3 sets - 10 reps - Gastroc Stretch on Step  - 2-4 x daily - 7 x weekly - 2-3 sets - 10 reps - 20-30 seconds hold - Standing Hamstring Stretch with Step  - 1-2 x daily - 7 x weekly - 1 sets - 3-10 reps - 20-30 seconds hold - Standing Knee Flexion Stretch on Step  - 1-2 x daily - 7 x weekly - 1 sets - 3-10 reps - 20-30 seconds hold - Prone Knee Extension with Ankle Weight  - 1-2 x daily - 7 x weekly - 1 sets - 1 reps - 5-10 minutes hold - Prone Knee Flexion Extension AROM  - 1-2 x daily - 7 x weekly - 2 sets - 10 reps - 5 seconds hold - Prone Hip Internal Rotation AROM  - 1-2 x daily - 7 x weekly - 2  sets - 10 reps - 5 seconds hold - Prone Hip External Rotation AROM  - 1-2 x daily - 7 x weekly - 2 sets - 10 reps - 5 seconds hold - Prone Quadriceps Stretch with Strap  - 1-2 x daily - 7 x weekly - 1 sets - 3 reps - 20-30 seconds hold   ASSESSMENT:   CLINICAL IMPRESSION:   She has increased knee extension in stance with gait and pivot turns. She has increased gym attendance and PT discussed LE machine options at gym and exercise pacing strategies which she appears to understand. She continues to benefit from skilled PT to address remaining strength, mobility, and functional deficits.  OBJECTIVE IMPAIRMENTS Abnormal gait, decreased activity tolerance, decreased balance, decreased knowledge of condition, decreased knowledge of use of DME, decreased mobility, difficulty walking, decreased ROM, decreased strength, increased edema, increased muscle spasms, impaired flexibility, postural dysfunction, and pain.    ACTIVITY LIMITATIONS community activity, driving, and personal recreation like Yoga & walking dogs .    PERSONAL FACTORS 1-2 comorbidities: see PMH  are also affecting patient's functional outcome.    REHAB POTENTIAL: Good   CLINICAL DECISION MAKING: Stable/uncomplicated   EVALUATION COMPLEXITY: Low     GOALS: Goals reviewed with patient? Yes  SHORT TERM GOALS: Target date: 08/31/2021  Patient independent with updated HEP, including exercise at gym and with progression to yoga. Baseline: SEE OBJECTIVE DATA Goal status: NEW 08/14/2021  2.  Right Knee AROM seated LAQ to -6* extension Baseline: SEE OBJECTIVE DATA Goal status: NEW 08/14/2021   3.  PROM right knee extension -3* to flexion 90* Baseline: SEE OBJECTIVE DATA Goal status: on going 08/14/2021  LONG TERM GOALS: Target date: 09/21/2021   Patient will improve FOTO score to 67% Baseline: SEE OBJECTIVE DATA Goal status: MET 08/14/2021   2.  Patient reports right knee pain </= 2/10 with standing & gait activities.   Baseline: SEE OBJECTIVE DATA Goal status: ongoing 08/14/2021   3.  Right Knee PROM 0* extension to 110*  flexion Baseline: SEE OBJECTIVE DATA Goal status: ongoing 08/14/2021   4.  Right Knee AROM seated -3* extension to 100* flexion Baseline: SEE OBJECTIVE DATA Goal status: ongoing 08/14/2021   5.  Patient ambulates >500' community distances including negotiating ramps, curbs & stairs without device independently. Baseline: SEE OBJECTIVE DATA Goal status:ongoing 08/14/2021   6.  Patient reports ability to perform Yoga & walk her dogs. Baseline: SEE OBJECTIVE DATA Goal status: ongoing 08/14/2021   PLAN: PT FREQUENCY: 2x/week   PT DURATION: 6 additional weeks   PLANNED INTERVENTIONS: Therapeutic exercises, Therapeutic activity, Neuromuscular re-education, Balance training, Gait training, Patient/Family education, Joint mobilization, Stair training, Aquatic training, DME instructions, Electrical stimulation, Cryotherapy, Moist heat, scar mobilization, Taping, Vasopneumatic device, physical performance testing and Manual therapy   PLAN FOR NEXT SESSION: Assess AROM/PROM and work towards STG. Evaluate stair negotiation. Progressive strengthening with alternating knee flexion/extension activity with manual therapy & exercises  Jana Hakim, SPT 08/21/21, 10:28 AM  This entire session of physical therapy was performed under the direct supervision of PT signing evaluation /treatment. PT reviewed note and agrees.  Jamey Reas, PT, DPT 08/21/21 11:26 AM

## 2021-08-21 ENCOUNTER — Ambulatory Visit (INDEPENDENT_AMBULATORY_CARE_PROVIDER_SITE_OTHER): Payer: No Typology Code available for payment source | Admitting: Physical Therapy

## 2021-08-21 ENCOUNTER — Encounter: Payer: Self-pay | Admitting: Physical Therapy

## 2021-08-21 DIAGNOSIS — M6281 Muscle weakness (generalized): Secondary | ICD-10-CM | POA: Diagnosis not present

## 2021-08-21 DIAGNOSIS — R2681 Unsteadiness on feet: Secondary | ICD-10-CM

## 2021-08-21 DIAGNOSIS — M25661 Stiffness of right knee, not elsewhere classified: Secondary | ICD-10-CM | POA: Diagnosis not present

## 2021-08-21 DIAGNOSIS — M25561 Pain in right knee: Secondary | ICD-10-CM

## 2021-08-21 DIAGNOSIS — R2689 Other abnormalities of gait and mobility: Secondary | ICD-10-CM | POA: Diagnosis not present

## 2021-08-21 DIAGNOSIS — R600 Localized edema: Secondary | ICD-10-CM

## 2021-08-23 ENCOUNTER — Ambulatory Visit (INDEPENDENT_AMBULATORY_CARE_PROVIDER_SITE_OTHER): Payer: No Typology Code available for payment source | Admitting: Surgical

## 2021-08-23 ENCOUNTER — Encounter: Payer: Self-pay | Admitting: Orthopedic Surgery

## 2021-08-23 ENCOUNTER — Ambulatory Visit (INDEPENDENT_AMBULATORY_CARE_PROVIDER_SITE_OTHER): Payer: No Typology Code available for payment source | Admitting: Physical Therapy

## 2021-08-23 ENCOUNTER — Encounter: Payer: Self-pay | Admitting: Physical Therapy

## 2021-08-23 DIAGNOSIS — M25561 Pain in right knee: Secondary | ICD-10-CM | POA: Diagnosis not present

## 2021-08-23 DIAGNOSIS — M6281 Muscle weakness (generalized): Secondary | ICD-10-CM | POA: Diagnosis not present

## 2021-08-23 DIAGNOSIS — M25661 Stiffness of right knee, not elsewhere classified: Secondary | ICD-10-CM

## 2021-08-23 DIAGNOSIS — R2681 Unsteadiness on feet: Secondary | ICD-10-CM

## 2021-08-23 DIAGNOSIS — R600 Localized edema: Secondary | ICD-10-CM

## 2021-08-23 DIAGNOSIS — R2689 Other abnormalities of gait and mobility: Secondary | ICD-10-CM

## 2021-08-23 DIAGNOSIS — Z96659 Presence of unspecified artificial knee joint: Secondary | ICD-10-CM

## 2021-08-23 MED ORDER — AMOXICILLIN 500 MG PO TABS: 2000.0000 mg | ORAL_TABLET | Freq: Once | ORAL | 0 refills | Status: AC

## 2021-08-23 NOTE — Progress Notes (Signed)
Post-Op Visit Note   Patient: Cheryl Tate           Date of Birth: 09/15/1970           MRN: [id number] Visit Date: 08/23/2021 PCP: Lavada Mesi, MD   Assessment & Plan:  Chief Complaint:  Chief Complaint  Patient presents with   Right Knee - Routine Post Op   Left Knee - Routine Post Op   Visit Diagnoses:  1. History of total knee arthroplasty, unspecified laterality     Plan: Patient is a 51 year old female who presents s/p right total knee arthroplasty on 05/22/2021 with subsequent right knee manipulation under anesthesia on 07/12/2021.  Doing well overall with no concerns.  Taking gabapentin twice daily and Advil just in the morning for pain control but overall does not really feel any pain.  No fevers or chills.  She is still working with physical therapy and going to the gym and keeping as active as possible.  She was recently measured around 100 degrees and PT.  Has dental appointment coming up in late August.  On exam, she has excellent quad strength rated 5/5.  No effusion noted.  She has a soft endpoint around 5 degrees of extension with 100 degrees of knee flexion.  No calf tenderness.  Negative Homans' sign.  Incision is well-healed with no evidence of infection or dehiscence.  She ambulates without significant antalgia.  Plan is to continue with physical therapy and home exercise program.  She is okay to transition to home exercise program full-time whenever she feels she is ready.  Follow-up in 2 months for clinical recheck which will likely be final check with Dr. August Saucer.  Follow-Up Instructions: No follow-ups on file.   Orders:  No orders of the defined types were placed in this encounter.  No orders of the defined types were placed in this encounter.   Imaging: No results found.  PMFS History: Patient Active Problem List   Diagnosis Date Noted   Fibrosis of right knee joint    Arthritis of right knee    S/P knee replacement 05/22/2021   BMI  33.0-33.9,adult 06/16/2015   Von Willebrand disease (HCC) 06/13/2015   Depression 12/29/2014   Rhinitis, allergic 12/28/2014   Past Medical History:  Diagnosis Date   Anemia    Depression    Menorrhagia    Mood disorder (HCC)    PONV (postoperative nausea and vomiting)    Von Willebrand's disease ([facility])    followed by hemotologist Dr. Myna Hidalgo    Family History  Problem Relation Age of Onset   Mental retardation Mother    Mental retardation Brother    Mental retardation Maternal Grandmother     Past Surgical History:  Procedure Laterality Date   APPENDECTOMY  01/22/2004   DILATION AND CURETTAGE OF UTERUS  2002   x 2 in 2002   DILITATION & CURRETTAGE/HYSTROSCOPY WITH HYDROTHERMAL ABLATION N/A 11/22/2014   Procedure: HYSTEROSCOPY WITH HYDROTHERMAL ABLATION;  Surgeon: Marcelle Overlie, MD;  Location: [facility];  Service: Gynecology;  Laterality: N/A;   EXAM UNDER ANESTHESIA WITH MANIPULATION OF KNEE Right 07/12/2021   Procedure: RIGHT EXAM UNDER ANESTHESIA WITH MANIPULATION OF KNEE;  Surgeon: Cammy Copa, MD;  Location: [facility] OR;  Service: Orthopedics;  Laterality: Right;   HYSTEROSCOPY WITH D & C  08-08-2014  in [place]   MULTIPLE TOOTH EXTRACTIONS     TOTAL KNEE ARTHROPLASTY Right 05/22/2021   Procedure: RIGHT TOTAL KNEE ARTHROPLASTY;  Surgeon: August Saucer,  Corrie Mckusick, MD;  Location: [facility] OR;  Service: Orthopedics;  Laterality: Right;   Social History   Occupational History   Occupation: Investment banker, operational, Transport planner  Tobacco Use   Smoking status: Every Day    Packs/day: 0.20    Years: 25.00    Total pack years: 5.00    Types: Cigarettes   Smokeless tobacco: Never   Tobacco comments:    2 cig daily  Vaping Use   Vaping Use: Never used  Substance and Sexual Activity   Alcohol use: Yes    Alcohol/week: 7.0 standard drinks of alcohol    Types: 7 Glasses of wine per week    Comment: one wine daily   Drug use: Yes    Types: Marijuana    Comment: helps with sleep  nightly   Sexual activity: Yes

## 2021-08-23 NOTE — Therapy (Signed)
OUTPATIENT PHYSICAL THERAPY TREATMENT NOTE   Patient Name: Cheryl Tate MRN: [id number] DOB:10-08-1970, 51 y.o., female Today's Date: 08/23/2021   END OF SESSION:   PT End of Session - 08/23/21 0931     Visit Number 39    Number of Visits 48    Date for PT Re-Evaluation 09/21/21    Authorization Type [employer]    Authorization Time Period [facility] CHOICE  NO VISIT LIMIT, PATIENT PAYS 100% UNTIL DED IS MET, DED $7500 [id number] MET, TERM DATE 09/19/2021    Progress Note Due on Visit 77    PT Start Time 0931    PT Stop Time 1014    PT Time Calculation (min) 43 min    Activity Tolerance Patient tolerated treatment well    Behavior During Therapy WFL for tasks assessed/performed              Past Medical History:  Diagnosis Date   Anemia    Depression    Menorrhagia    Mood disorder (HCC)    PONV (postoperative nausea and vomiting)    Von Willebrand's disease ([place])    followed by hemotologist Dr. Marin Olp   Past Surgical History:  Procedure Laterality Date   APPENDECTOMY  01/22/2004   DILATION AND CURETTAGE OF UTERUS  2002   x 2 in 2002   DILITATION & CURRETTAGE/HYSTROSCOPY WITH HYDROTHERMAL ABLATION N/A 11/22/2014   Procedure: HYSTEROSCOPY WITH HYDROTHERMAL ABLATION;  Surgeon: Dian Queen, MD;  Location: [place];  Service: Gynecology;  Laterality: N/A;   EXAM UNDER ANESTHESIA WITH MANIPULATION OF KNEE Right 07/12/2021   Procedure: RIGHT EXAM UNDER ANESTHESIA WITH MANIPULATION OF KNEE;  Surgeon: Meredith Pel, MD;  Location: [place];  Service: Orthopedics;  Laterality: Right;   HYSTEROSCOPY WITH D & C  08-08-2014  in [place]   MULTIPLE TOOTH EXTRACTIONS     TOTAL KNEE ARTHROPLASTY Right 05/22/2021   Procedure: RIGHT TOTAL KNEE ARTHROPLASTY;  Surgeon: Meredith Pel, MD;  Location: [place];  Service: Orthopedics;  Laterality: Right;   Patient Active Problem List   Diagnosis Date Noted   Fibrosis of right knee joint    Arthritis of right knee    S/P  knee replacement 05/22/2021   BMI 33.0-33.9,adult 06/16/2015   Von Willebrand disease ([place]) 06/13/2015   Depression 12/29/2014   Rhinitis, allergic 12/28/2014    PCP: Eunice Blase, MD   REFERRING PROVIDER: Donella Stade, PA-C   REFERRING DIAG: [phone] (ICD-10-CM) - History of total knee arthroplasty, unspecified laterality  ONSET DATE: 05/22/2021 Right TKA   THERAPY DIAG:  Stiffness of right knee, not elsewhere classified  Muscle weakness (generalized)  Acute pain of right knee  Other abnormalities of gait and mobility  Localized edema  Unsteadiness on feet  PERTINENT HISTORY: Von Willebrand disease, depression, Menorrhgia  PRECAUTIONS: None  SUBJECTIVE: She has more stiffness today and took Advil prior to appointment for inflammation. [facility] and Nustep, and propped her heel during a movie for extension. She lost her cat yesterday.  PAIN:  NPRS scale: 1/10 currently, range 0-1/10 since last appointment Pain location: right knee all over.  Pain description: sharp, sore, cramps Aggravating factors: weather, stiffness in morning Relieving factors: ice, meds   OBJECTIVE:    PATIENT SURVEYS:  05/28/2021 FOTO 29% functional & target 67% 08/14/2021 FOTO 71%   COGNITION: 05/28/2021 Overall cognitive status: Within functional limits for tasks assessed  EDEMA: 05/28/2021 RLE above knee 51.8cm around knee 48cm below knee  41.2cm LLE above knee 45.8cm around knee 40cm below knee 34.1cm   PALPATION: 05/28/2021 Bruising along medial knee & lower leg, tenderness over joint line, quads, hamstrings   LE ROM:   ROM P: Passive  A: Active Right 05/28/21 Right 05/31/21 Right 06/05/21 Right 06/06/21 Right 06/11/21 Right 06/14/21 Right 06/19/21 Right 06/26/21 Right 06/28/21 Right 07/02/21 Right 07/13/21 Right 07/19/21 Right 07/20/21 Right 07/23/2021 Right 07/27/21 Right 07/30/21 Right 08/06/21 Right 08/08/21 Right 08/14/21  Right  08/16/21 Right 08/23/21  Hip flexion                        Hip extension                        Hip abduction                        Hip adduction                        Hip internal rotation                        Hip external rotation                        Knee flexion Supine P: 64* Seated  A: 64* Supine AROM heel slide:61 PROM overpressure in heel slide : 66   Supine AA: 73* Seated  P: 75*  Seated P: 79* Seated P: 75 Seated P: 70 with muscle guarding limiting Seated P:78 after manual therapy Seated P: 75*  A: 64* after manual therapy Supine A 70 P 78 AA 84 (after exercise) Seated P:85 After maual therapy Seated P:93 after manual therapy Seated P: 97*  Seated P: 98* Seated A: 85* P: 100* After manual therapy  Supine A: 94* P: 98* Seated A: 94* P: 105* Seated  P: 105* After manual Seated A: 96* P: 108* Supine gravity assist AA: 100*  Knee extension Supine P: -10* Seated  A: -31* Seated AROM LAQ: -21 Seated LAQ A: -14* Supine P: -9* Supine A: -8 Supine P: -7* Supine A: -8 P:-6 Supine A:-8 P:-5  Seated P: -7* after manual therapy Seated  A -16 (seated LAQ)   Supine P: -9* Supine  P:-9 after manual therapy Supine P: -8* after manual Supine A: -15* P: -9* after manual Standing A: -9* after some manual Seated LAQ A: -15* Supine A: -13* P: -4*  Seated LAQ A: -15* Seated LAQ  posterior trunk lean  A: -13* Supine Heel prop A: -9* P: -2*  Ankle dorsiflexion                        Ankle plantarflexion                        Ankle inversion                        Ankle eversion                         (Blank rows = not tested)   LE MMT:   MMT Right 05/28/2021 Left 05/28/2021 Right 08/03/2021  Hip flexion  Hip extension       Hip abduction       Hip adduction       Hip internal rotation       Hip external rotation       Knee flexion 2/5  4/5  Knee extension 2/5    Ankle dorsiflexion       Ankle plantarflexion       Ankle  inversion       Ankle eversion        (Blank rows = not tested)   GAIT: 08/16/2021 no AD with improved Rt knee extension in stance and Rt knee flexion in swing  Pivot turns present with reduced Rt knee extension  08/03/2021  SPC use into clinic, reduced extension throughout gait cycle.   05/28/2021 Distance walked: 87' Assistive device utilized:  4 footed walker level of assistance: SBA  verbal cues needed Comments: antalgic, right knee flexed in stance, limited to no increase flexion for swing, RLE abducted,    Functional Activities: Pt using LLE to move RLE in & out of bed.  PT min guard RLE and she was able to move RLE without assisting with LLE.    TODAY'S TREATMENT: 08/23/21 TherEx -Leg press back 45* DL 87# moving into max flexion holding 5 seconds then max extension holding 5 sec 1 x 20 reps, RLE 43# 5s hold 1 x 20 reps -Staircase knee flexion stretch 2 x 20s -Staircase heel prop hamstring stretch 2 x 20s -Staircase standing gastroc stretch 2 x 20s  -Staircase stretches videoed on pt phone and added to HEP, pt verbalized understanding -Seated LAQ with active knee flexion and contralateral motion x 10 reps -Supine gravity assist knee flexion x 10 reps  TherAct -11 step staircase alternating pattern desc/ascend x 1 flight with LUE rail, x 1 flight no UE support - one descending step with decreased Rt quad control, full control in rest of steps  Manual -End range extension and flexion grade 2/3 mobs during leg press & supine  08/21/21 TherEx -Leg press back 45* DL 81# moving into max flexion holding 5 seconds then max extension holding 5 sec 1 x 20 reps, Back flat DL 81# 5s flex/ext hold 1 x 15 reps, RLE 37# 5s hold 1 x 15 reps - PT manual assist for extension range -Standing calf stretch on incline 2 x 30s -Standing calf raises 2 x 10 reps -Standing quad stretch 2 x 30s -Seated leg extension machine BLE 20# x 15 reps, BLE concentric with RLE 10# x 15 reps with 3s hold and slow  eccentric -Seated leg curl BLE 35# x 15 reps, RLE 15# x 15 reps -PT education for leg machines at gym, weight indications, exercise pacing, pt verbalized understanding  Manual -Knee extension grade 3 mobs with strap during standing calf stretch 2 x 12  08/16/21 TherEx -Leg press 45* DL 81# moving into max flexion holding 5 seconds then max extension holding 5 sec 1 x 20 reps, Back flat DL 81# 5s flex/ext hold 1 x 15 reps, RLE 37# 5s hold 1 x 15 reps - verbal and tactile cues for knee extension  TherAct -PT verbal cues for Rt knee extension in stance including in turns with demo, pt returned demo  Manual -IASTM in knee flexion and extension to medial and lateral hamstring tendons, joint line ligaments - PT palpated less tightness in hamstring tendons following and pt reported less pain with active and passive knee flexion to end range -Knee flexion PROM  to end range seated and supine hip extended with knee over edge of table  Vaso R knee 10 minutes High Pressure 34* post-treatment with knee extension prop for 8 min  HOME EXERCISE PROGRAM: Access Code: [email or web address] URL: [email or web address] Date: 08/06/2021 Prepared by: Jamey Reas  Exercises - Supine Heel Slide with Strap  - 2-3 x daily - 7 x weekly - 2-3 sets - 10 reps - 5 seconds hold - Supine Quadriceps Stretch with Strap on Table  - 2-3 x daily - 7 x weekly - 1 sets - 2-3 reps - 20-30 seconds hold - Seated Knee Flexion Stretch  - 2-5 x daily - 7 x weekly - 2-3 sets - 10 reps - 5 sec hold - Seated Passive Knee Extension with Weight  - 2-5 x daily - 7 x weekly - 1 sets - 1 reps - 5-10 minutes hold - Seated Hamstring Stretch with Strap  - 2-4 x daily - 7 x weekly - 1 sets - 3 reps - 20-30 seconds hold - Sit to Stand with Counter Support  - 2-4 x daily - 7 x weekly - 2-3 sets - 10 reps - Gastroc Stretch on Step  - 2-4 x daily - 7 x weekly - 2-3 sets - 10 reps - 20-30 seconds hold - Standing Hamstring Stretch with Step  -  1-2 x daily - 7 x weekly - 1 sets - 3-10 reps - 20-30 seconds hold - Standing Knee Flexion Stretch on Step  - 1-2 x daily - 7 x weekly - 1 sets - 3-10 reps - 20-30 seconds hold - Prone Knee Extension with Ankle Weight  - 1-2 x daily - 7 x weekly - 1 sets - 1 reps - 5-10 minutes hold - Prone Knee Flexion Extension AROM  - 1-2 x daily - 7 x weekly - 2 sets - 10 reps - 5 seconds hold - Prone Hip Internal Rotation AROM  - 1-2 x daily - 7 x weekly - 2 sets - 10 reps - 5 seconds hold - Prone Hip External Rotation AROM  - 1-2 x daily - 7 x weekly - 2 sets - 10 reps - 5 seconds hold - Prone Quadriceps Stretch with Strap  - 1-2 x daily - 7 x weekly - 1 sets - 3 reps - 20-30 seconds hold   ASSESSMENT:   CLINICAL IMPRESSION:   Her knee range is continuing to improve, seeing improvements in both active and passive flexion and extension through multiple positions. She navigates stairs with an alternating pattern safely and with control, able to use a full staircase without handrail assist. She is active at the gym and continues doing exercises outside of PT, and is continuing to progress in LE strength. She continues to benefit from skilled PT.  OBJECTIVE IMPAIRMENTS Abnormal gait, decreased activity tolerance, decreased balance, decreased knowledge of condition, decreased knowledge of use of DME, decreased mobility, difficulty walking, decreased ROM, decreased strength, increased edema, increased muscle spasms, impaired flexibility, postural dysfunction, and pain.    ACTIVITY LIMITATIONS community activity, driving, and personal recreation like Yoga & walking dogs .    PERSONAL FACTORS 1-2 comorbidities: see PMH  are also affecting patient's functional outcome.    REHAB POTENTIAL: Good   CLINICAL DECISION MAKING: Stable/uncomplicated   EVALUATION COMPLEXITY: Low     GOALS: Goals reviewed with patient? Yes  SHORT TERM GOALS: Target date: 08/31/2021  Patient independent with updated HEP, including  exercise at gym and with progression to yoga. Baseline: SEE  OBJECTIVE DATA Goal status: NEW 08/14/2021  2.  Right Knee AROM seated LAQ to -6* extension Baseline: SEE OBJECTIVE DATA Goal status: ongoing 08/23/21   3.  PROM right knee extension -3* to flexion 90* Baseline: SEE OBJECTIVE DATA Goal status: Met 08/23/21  LONG TERM GOALS: Target date: 09/21/2021   Patient will improve FOTO score to 67% Baseline: SEE OBJECTIVE DATA Goal status: MET 08/14/2021   2.  Patient reports right knee pain </= 2/10 with standing & gait activities.  Baseline: SEE OBJECTIVE DATA Goal status: ongoing 08/14/2021   3.  Right Knee PROM 0* extension to 110* flexion Baseline: SEE OBJECTIVE DATA Goal status: ongoing 08/14/2021   4.  Right Knee AROM seated -3* extension to 100* flexion Baseline: SEE OBJECTIVE DATA Goal status: ongoing 08/14/2021   5.  Patient ambulates >500' community distances including negotiating ramps, curbs & stairs without device independently. Baseline: SEE OBJECTIVE DATA Goal status:ongoing 08/14/2021   6.  Patient reports ability to perform Yoga & walk her dogs. Baseline: SEE OBJECTIVE DATA Goal status: ongoing 08/14/2021   PLAN: PT FREQUENCY: 2x/week   PT DURATION: 6 additional weeks   PLANNED INTERVENTIONS: Therapeutic exercises, Therapeutic activity, Neuromuscular re-education, Balance training, Gait training, Patient/Family education, Joint mobilization, Stair training, Aquatic training, DME instructions, Electrical stimulation, Cryotherapy, Moist heat, scar mobilization, Taping, Vasopneumatic device, physical performance testing and Manual therapy   PLAN FOR NEXT SESSION: Progressive quad strengthening with alternating knee flexion/extension activity, manual therapy to improve passive range  Jana Hakim, SPT 08/23/21, 10:44 AM  This entire session of physical therapy was performed under the direct supervision of PT signing evaluation /treatment. PT reviewed note and  agrees.  Jamey Reas, PT, DPT 08/23/21 10:46 AM

## 2021-08-27 ENCOUNTER — Ambulatory Visit (INDEPENDENT_AMBULATORY_CARE_PROVIDER_SITE_OTHER): Payer: No Typology Code available for payment source | Admitting: Physical Therapy

## 2021-08-27 ENCOUNTER — Encounter: Payer: Self-pay | Admitting: Physical Therapy

## 2021-08-27 DIAGNOSIS — M25661 Stiffness of right knee, not elsewhere classified: Secondary | ICD-10-CM

## 2021-08-27 DIAGNOSIS — M6281 Muscle weakness (generalized): Secondary | ICD-10-CM | POA: Diagnosis not present

## 2021-08-27 DIAGNOSIS — M25561 Pain in right knee: Secondary | ICD-10-CM

## 2021-08-27 DIAGNOSIS — R2689 Other abnormalities of gait and mobility: Secondary | ICD-10-CM | POA: Diagnosis not present

## 2021-08-27 DIAGNOSIS — R600 Localized edema: Secondary | ICD-10-CM

## 2021-08-27 NOTE — Therapy (Signed)
OUTPATIENT PHYSICAL THERAPY TREATMENT NOTE   Patient Name: Cheryl Tate MRN: [id number] DOB:1971-01-21, 51 y.o., female Today's Date: 08/27/2021   END OF SESSION:   PT End of Session - 08/27/21 0809     Visit Number 40    Number of Visits 48    Date for PT Re-Evaluation 09/21/21    Authorization Type [employer]    Authorization Time Period [facility] CHOICE  NO VISIT LIMIT, PATIENT PAYS 100% UNTIL DED IS MET, DED $7500 [id number] MET, TERM DATE 09/19/2021    Progress Note Due on Visit 75    PT Start Time 0800    PT Stop Time 0845    PT Time Calculation (min) 45 min    Activity Tolerance Patient tolerated treatment well    Behavior During Therapy WFL for tasks assessed/performed              Past Medical History:  Diagnosis Date   Anemia    Depression    Menorrhagia    Mood disorder (HCC)    PONV (postoperative nausea and vomiting)    Von Willebrand's disease ([place])    followed by hemotologist Dr. Marin Olp   Past Surgical History:  Procedure Laterality Date   APPENDECTOMY  01/22/2004   DILATION AND CURETTAGE OF UTERUS  2002   x 2 in 2002   DILITATION & CURRETTAGE/HYSTROSCOPY WITH HYDROTHERMAL ABLATION N/A 11/22/2014   Procedure: HYSTEROSCOPY WITH HYDROTHERMAL ABLATION;  Surgeon: Dian Queen, MD;  Location: [place];  Service: Gynecology;  Laterality: N/A;   EXAM UNDER ANESTHESIA WITH MANIPULATION OF KNEE Right 07/12/2021   Procedure: RIGHT EXAM UNDER ANESTHESIA WITH MANIPULATION OF KNEE;  Surgeon: Meredith Pel, MD;  Location: [place];  Service: Orthopedics;  Laterality: Right;   HYSTEROSCOPY WITH D & C  08-08-2014  in [place]   MULTIPLE TOOTH EXTRACTIONS     TOTAL KNEE ARTHROPLASTY Right 05/22/2021   Procedure: RIGHT TOTAL KNEE ARTHROPLASTY;  Surgeon: Meredith Pel, MD;  Location: [place];  Service: Orthopedics;  Laterality: Right;   Patient Active Problem List   Diagnosis Date Noted   Fibrosis of right knee joint    Arthritis of right knee    S/P  knee replacement 05/22/2021   BMI 33.0-33.9,adult 06/16/2015   Von Willebrand disease ([place]) 06/13/2015   Depression 12/29/2014   Rhinitis, allergic 12/28/2014    PCP: Eunice Blase, MD   REFERRING PROVIDER: Donella Stade, PA-C   REFERRING DIAG: [phone] (ICD-10-CM) - History of total knee arthroplasty, unspecified laterality  ONSET DATE: 05/22/2021 Right TKA   THERAPY DIAG:  Stiffness of right knee, not elsewhere classified  Muscle weakness (generalized)  Acute pain of right knee  Other abnormalities of gait and mobility  Localized edema  PERTINENT HISTORY: Von Willebrand disease, depression, Menorrhgia  PRECAUTIONS: None  SUBJECTIVE: She reports tooth infection so is on antibiotics for this, her knee is stiff but not too painful. She walked the stairs a few times before session as she arrived early  PAIN:  NPRS scale: 1-2/10 currently Pain location: right knee all over.  Pain description: sharp, sore, cramps Aggravating factors: weather, stiffness in morning Relieving factors: ice, meds   OBJECTIVE:    PATIENT SURVEYS:  05/28/2021 FOTO 29% functional & target 67% 08/14/2021 FOTO 71%   COGNITION: 05/28/2021 Overall cognitive status: Within functional limits for tasks assessed                EDEMA: 05/28/2021 RLE above knee 51.8cm around knee 48cm below  knee  41.2cm LLE above knee 45.8cm around knee 40cm below knee 34.1cm   PALPATION: 05/28/2021 Bruising along medial knee & lower leg, tenderness over joint line, quads, hamstrings   LE ROM:   ROM P: Passive  A: Active Right 05/28/21 Right 05/31/21 Right 06/05/21 Right 06/06/21 Right 06/11/21 Right 06/14/21 Right 06/19/21 Right 06/26/21 Right 06/28/21 Right 07/02/21 Right 07/13/21 Right 07/19/21 Right 07/20/21 Right 07/23/2021 Right 07/27/21 Right 07/30/21 Right 08/06/21 Right 08/08/21 Right 08/14/21 Right  08/16/21 Right 08/23/21  Hip flexion                        Hip extension                        Hip abduction                         Hip adduction                        Hip internal rotation                        Hip external rotation                        Knee flexion Supine P: 64* Seated  A: 64* Supine AROM heel slide:61 PROM overpressure in heel slide : 66   Supine AA: 73* Seated  P: 75*  Seated P: 79* Seated P: 75 Seated P: 70 with muscle guarding limiting Seated P:78 after manual therapy Seated P: 75*  A: 64* after manual therapy Supine A 70 P 78 AA 84 (after exercise) Seated P:85 After maual therapy Seated P:93 after manual therapy Seated P: 97*  Seated P: 98* Seated A: 85* P: 100* After manual therapy  Supine A: 94* P: 98* Seated A: 94* P: 105* Seated  P: 105* After manual Seated A: 96* P: 108* Supine gravity assist AA: 100*  Knee extension Supine P: -10* Seated  A: -31* Seated AROM LAQ: -21 Seated LAQ A: -14* Supine P: -9* Supine A: -8 Supine P: -7* Supine A: -8 P:-6 Supine A:-8 P:-5  Seated P: -7* after manual therapy Seated  A -16 (seated LAQ)   Supine P: -9* Supine  P:-9 after manual therapy Supine P: -8* after manual Supine A: -15* P: -9* after manual Standing A: -9* after some manual Seated LAQ A: -15* Supine A: -13* P: -4*  Seated LAQ A: -15* Seated LAQ  posterior trunk lean  A: -13* Supine Heel prop A: -9* P: -2*  Ankle dorsiflexion                        Ankle plantarflexion                        Ankle inversion                        Ankle eversion                         (Blank rows = not tested)   LE MMT:   MMT Right 05/28/2021 Left 05/28/2021 Right 08/03/2021  Hip flexion       Hip extension  Hip abduction       Hip adduction       Hip internal rotation       Hip external rotation       Knee flexion 2/5  4/5  Knee extension 2/5    Ankle dorsiflexion       Ankle plantarflexion       Ankle inversion       Ankle eversion        (Blank rows = not tested)   GAIT: 08/16/2021 no AD with improved Rt knee extension in  stance and Rt knee flexion in swing  Pivot turns present with reduced Rt knee extension  08/03/2021  SPC use into clinic, reduced extension throughout gait cycle.   05/28/2021 Distance walked: 82' Assistive device utilized:  4 footed walker level of assistance: SBA  verbal cues needed Comments: antalgic, right knee flexed in stance, limited to no increase flexion for swing, RLE abducted,    Functional Activities: Pt using LLE to move RLE in & out of bed.  PT min guard RLE and she was able to move RLE without assisting with LLE.    TODAY'S TREATMENT: 08/27/21 TherEx -Sci fit bike L5 8 min -Leg press back 45* DL 87# moving into max flexion holding 5 seconds then max extension holding 5 sec 1 x 20 reps, RLE 50# 5s hold 1 x 20 reps -Staircase knee flexion stretch 10 x 10s -Staircase heel prop hamstring stretch 3 x 20s -Slantboard stretch 30 sec X3 -Seated knee extension machine 10# DL with slow eccentrics and holding flexion stretch 3 seconds (set up with 2nd notch from most extension ROM)  Manual -Rt knee PROM in flexion and extension with overpressure and mobs grade 3-4. IASTM to hamstrings In prone.   08/23/21 TherEx -Leg press back 45* DL 87# moving into max flexion holding 5 seconds then max extension holding 5 sec 1 x 20 reps, RLE 43# 5s hold 1 x 20 reps -Staircase knee flexion stretch 2 x 20s -Staircase heel prop hamstring stretch 2 x 20s -Staircase standing gastroc stretch 2 x 20s  -Staircase stretches videoed on pt phone and added to HEP, pt verbalized understanding -Seated LAQ with active knee flexion and contralateral motion x 10 reps -Supine gravity assist knee flexion x 10 reps  TherAct -11 step staircase alternating pattern desc/ascend x 1 flight with LUE rail, x 1 flight no UE support - one descending step with decreased Rt quad control, full control in rest of steps  Manual -End range extension and flexion grade 2/3 mobs during leg press & supine  08/21/21 TherEx -Leg  press back 45* DL 81# moving into max flexion holding 5 seconds then max extension holding 5 sec 1 x 20 reps, Back flat DL 81# 5s flex/ext hold 1 x 15 reps, RLE 37# 5s hold 1 x 15 reps - PT manual assist for extension range -Standing calf stretch on incline 2 x 30s -Standing calf raises 2 x 10 reps -Standing quad stretch 2 x 30s -Seated leg extension machine BLE 20# x 15 reps, BLE concentric with RLE 10# x 15 reps with 3s hold and slow eccentric -Seated leg curl BLE 35# x 15 reps, RLE 15# x 15 reps -PT education for leg machines at gym, weight indications, exercise pacing, pt verbalized understanding  Manual -Knee extension grade 3 mobs with strap during standing calf stretch 2 x 12  08/16/21 TherEx -Leg press 45* DL 81# moving into max flexion holding 5 seconds then max  extension holding 5 sec 1 x 20 reps, Back flat DL 81# 5s flex/ext hold 1 x 15 reps, RLE 37# 5s hold 1 x 15 reps - verbal and tactile cues for knee extension  TherAct -PT verbal cues for Rt knee extension in stance including in turns with demo, pt returned demo  Manual -IASTM in knee flexion and extension to medial and lateral hamstring tendons, joint line ligaments - PT palpated less tightness in hamstring tendons following and pt reported less pain with active and passive knee flexion to end range -Knee flexion PROM to end range seated and supine hip extended with knee over edge of table  Vaso R knee 10 minutes High Pressure 34* post-treatment with knee extension prop for 8 min  HOME EXERCISE PROGRAM: Access Code: [email or web address] URL: [email or web address] Date: 08/06/2021 Prepared by: Jamey Reas  Exercises - Supine Heel Slide with Strap  - 2-3 x daily - 7 x weekly - 2-3 sets - 10 reps - 5 seconds hold - Supine Quadriceps Stretch with Strap on Table  - 2-3 x daily - 7 x weekly - 1 sets - 2-3 reps - 20-30 seconds hold - Seated Knee Flexion Stretch  - 2-5 x daily - 7 x weekly - 2-3 sets - 10 reps - 5 sec  hold - Seated Passive Knee Extension with Weight  - 2-5 x daily - 7 x weekly - 1 sets - 1 reps - 5-10 minutes hold - Seated Hamstring Stretch with Strap  - 2-4 x daily - 7 x weekly - 1 sets - 3 reps - 20-30 seconds hold - Sit to Stand with Counter Support  - 2-4 x daily - 7 x weekly - 2-3 sets - 10 reps - Gastroc Stretch on Step  - 2-4 x daily - 7 x weekly - 2-3 sets - 10 reps - 20-30 seconds hold - Standing Hamstring Stretch with Step  - 1-2 x daily - 7 x weekly - 1 sets - 3-10 reps - 20-30 seconds hold - Standing Knee Flexion Stretch on Step  - 1-2 x daily - 7 x weekly - 1 sets - 3-10 reps - 20-30 seconds hold - Prone Knee Extension with Ankle Weight  - 1-2 x daily - 7 x weekly - 1 sets - 1 reps - 5-10 minutes hold - Prone Knee Flexion Extension AROM  - 1-2 x daily - 7 x weekly - 2 sets - 10 reps - 5 seconds hold - Prone Hip Internal Rotation AROM  - 1-2 x daily - 7 x weekly - 2 sets - 10 reps - 5 seconds hold - Prone Hip External Rotation AROM  - 1-2 x daily - 7 x weekly - 2 sets - 10 reps - 5 seconds hold - Prone Quadriceps Stretch with Strap  - 1-2 x daily - 7 x weekly - 1 sets - 3 reps - 20-30 seconds hold   ASSESSMENT:   CLINICAL IMPRESSION:   She felt more stiff today so worked on ROM emphasis. She reports improvement after session. PT recommending to continue POC.  OBJECTIVE IMPAIRMENTS Abnormal gait, decreased activity tolerance, decreased balance, decreased knowledge of condition, decreased knowledge of use of DME, decreased mobility, difficulty walking, decreased ROM, decreased strength, increased edema, increased muscle spasms, impaired flexibility, postural dysfunction, and pain.    ACTIVITY LIMITATIONS community activity, driving, and personal recreation like Yoga & walking dogs .    PERSONAL FACTORS 1-2 comorbidities: see PMH  are also affecting patient's functional outcome.  REHAB POTENTIAL: Good   CLINICAL DECISION MAKING: Stable/uncomplicated   EVALUATION COMPLEXITY:  Low     GOALS: Goals reviewed with patient? Yes  SHORT TERM GOALS: Target date: 08/31/2021  Patient independent with updated HEP, including exercise at gym and with progression to yoga. Baseline: SEE OBJECTIVE DATA Goal status: NEW 08/14/2021  2.  Right Knee AROM seated LAQ to -6* extension Baseline: SEE OBJECTIVE DATA Goal status: ongoing 08/23/21   3.  PROM right knee extension -3* to flexion 90* Baseline: SEE OBJECTIVE DATA Goal status: Met 08/23/21  LONG TERM GOALS: Target date: 09/21/2021   Patient will improve FOTO score to 67% Baseline: SEE OBJECTIVE DATA Goal status: MET 08/14/2021   2.  Patient reports right knee pain </= 2/10 with standing & gait activities.  Baseline: SEE OBJECTIVE DATA Goal status: ongoing 08/14/2021   3.  Right Knee PROM 0* extension to 110* flexion Baseline: SEE OBJECTIVE DATA Goal status: ongoing 08/14/2021   4.  Right Knee AROM seated -3* extension to 100* flexion Baseline: SEE OBJECTIVE DATA Goal status: ongoing 08/14/2021   5.  Patient ambulates >500' community distances including negotiating ramps, curbs & stairs without device independently. Baseline: SEE OBJECTIVE DATA Goal status:ongoing 08/14/2021   6.  Patient reports ability to perform Yoga & walk her dogs. Baseline: SEE OBJECTIVE DATA Goal status: ongoing 08/14/2021   PLAN: PT FREQUENCY: 2x/week   PT DURATION: 6 additional weeks   PLANNED INTERVENTIONS: Therapeutic exercises, Therapeutic activity, Neuromuscular re-education, Balance training, Gait training, Patient/Family education, Joint mobilization, Stair training, Aquatic training, DME instructions, Electrical stimulation, Cryotherapy, Moist heat, scar mobilization, Taping, Vasopneumatic device, physical performance testing and Manual therapy   PLAN FOR NEXT SESSION: Progressive quad strengthening with alternating knee flexion/extension activity, manual therapy to improve passive range  Elsie Ra, PT, DPT 08/27/21 8:49 AM

## 2021-08-29 NOTE — Therapy (Signed)
OUTPATIENT PHYSICAL THERAPY TREATMENT NOTE   Patient Name: Cheryl Tate MRN: [id number] DOB:Feb 22, 1970, 51 y.o., female Today's Date: 08/29/2021   END OF SESSION:      Past Medical History:  Diagnosis Date   Anemia    Depression    Menorrhagia    Mood disorder ([place])    PONV (postoperative nausea and vomiting)    Von Willebrand's disease [facility])    followed by hemotologist Dr. Marin Olp   Past Surgical History:  Procedure Laterality Date   APPENDECTOMY  01/22/2004   DILATION AND CURETTAGE OF UTERUS  2002   x 2 in 2002   DILITATION & CURRETTAGE/HYSTROSCOPY WITH HYDROTHERMAL ABLATION N/A 11/22/2014   Procedure: HYSTEROSCOPY WITH HYDROTHERMAL ABLATION;  Surgeon: Dian Queen, MD;  Location: [place];  Service: Gynecology;  Laterality: N/A;   EXAM UNDER ANESTHESIA WITH MANIPULATION OF KNEE Right 07/12/2021   Procedure: RIGHT EXAM UNDER ANESTHESIA WITH MANIPULATION OF KNEE;  Surgeon: Meredith Pel, MD;  Location: [place];  Service: Orthopedics;  Laterality: Right;   HYSTEROSCOPY WITH D & C  08-08-2014  in [place]   MULTIPLE TOOTH EXTRACTIONS     TOTAL KNEE ARTHROPLASTY Right 05/22/2021   Procedure: RIGHT TOTAL KNEE ARTHROPLASTY;  Surgeon: Meredith Pel, MD;  Location: [facility];  Service: Orthopedics;  Laterality: Right;   Patient Active Problem List   Diagnosis Date Noted   Fibrosis of right knee joint    Arthritis of right knee    S/P knee replacement 05/22/2021   BMI 33.0-33.9,adult 06/16/2015   Von Willebrand disease ([place]) 06/13/2015   Depression 12/29/2014   Rhinitis, allergic 12/28/2014    PCP: Eunice Blase, MD   REFERRING PROVIDER: Donella Stade, PA-C   REFERRING DIAG: [phone] (ICD-10-CM) - History of total knee arthroplasty, unspecified laterality  ONSET DATE: 05/22/2021 Right TKA   THERAPY DIAG:  No diagnosis found.  PERTINENT HISTORY: Von Willebrand disease, depression, Menorrhgia  PRECAUTIONS: None  SUBJECTIVE: *** She  reports tooth infection so is on antibiotics for this, her knee is stiff but not too painful. She walked the stairs a few times before session as she arrived early  PAIN:  NPRS scale: *** 1-2/10 currently Pain location: right knee all over.  Pain description: sharp, sore, cramps Aggravating factors: weather, stiffness in morning Relieving factors: ice, meds   OBJECTIVE:    PATIENT SURVEYS:  05/28/2021 FOTO 29% functional & target 67% 08/14/2021 FOTO 71%   COGNITION: 05/28/2021 Overall cognitive status: Within functional limits for tasks assessed                EDEMA: 05/28/2021 RLE above knee 51.8cm around knee 48cm below knee  41.2cm LLE above knee 45.8cm around knee 40cm below knee 34.1cm   PALPATION: 05/28/2021 Bruising along medial knee & lower leg, tenderness over joint line, quads, hamstrings   LE ROM:   ROM P: Passive  A: Active Right 05/28/21 Right 05/31/21 Right 06/05/21 Right 06/06/21 Right 06/11/21 Right 06/14/21 Right 06/19/21 Right 06/26/21 Right 06/28/21 Right 07/02/21 Right 07/13/21 Right 07/19/21 Right 07/20/21 Right 07/23/2021 Right 07/27/21 Right 07/30/21 Right 08/06/21 Right 08/08/21 Right 08/14/21 Right  08/16/21 Right 08/23/21  Hip flexion                        Hip extension                        Hip abduction  Hip adduction                        Hip internal rotation                        Hip external rotation                        Knee flexion Supine P: 64* Seated  A: 64* Supine AROM heel slide:61 PROM overpressure in heel slide : 66   Supine AA: 73* Seated  P: 75*  Seated P: 79* Seated P: 75 Seated P: 70 with muscle guarding limiting Seated P:78 after manual therapy Seated P: 75*  A: 64* after manual therapy Supine A 70 P 78 AA 84 (after exercise) Seated P:85 After maual therapy Seated P:93 after manual therapy Seated P: 97*  Seated P: 98* Seated A: 85* P: 100* After manual therapy  Supine A: 94* P: 98* Seated A:  94* P: 105* Seated  P: 105* After manual Seated A: 96* P: 108* Supine gravity assist AA: 100*  Knee extension Supine P: -10* Seated  A: -31* Seated AROM LAQ: -21 Seated LAQ A: -14* Supine P: -9* Supine A: -8 Supine P: -7* Supine A: -8 P:-6 Supine A:-8 P:-5  Seated P: -7* after manual therapy Seated  A -16 (seated LAQ)   Supine P: -9* Supine  P:-9 after manual therapy Supine P: -8* after manual Supine A: -15* P: -9* after manual Standing A: -9* after some manual Seated LAQ A: -15* Supine A: -13* P: -4*  Seated LAQ A: -15* Seated LAQ  posterior trunk lean  A: -13* Supine Heel prop A: -9* P: -2*  Ankle dorsiflexion                        Ankle plantarflexion                        Ankle inversion                        Ankle eversion                         (Blank rows = not tested)   LE MMT:   MMT Right 05/28/2021 Left 05/28/2021 Right 08/03/2021  Hip flexion       Hip extension       Hip abduction       Hip adduction       Hip internal rotation       Hip external rotation       Knee flexion 2/5  4/5  Knee extension 2/5    Ankle dorsiflexion       Ankle plantarflexion       Ankle inversion       Ankle eversion        (Blank rows = not tested)   GAIT: 08/16/2021 no AD with improved Rt knee extension in stance and Rt knee flexion in swing  Pivot turns present with reduced Rt knee extension  08/03/2021  SPC use into clinic, reduced extension throughout gait cycle.   05/28/2021 Distance walked: 14' Assistive device utilized:  4 footed walker level of assistance: SBA  verbal cues needed Comments: antalgic, right knee flexed in stance, limited to no increase flexion for swing, RLE abducted,  Functional Activities: Pt using LLE to move RLE in & out of bed.  PT min guard RLE and she was able to move RLE without assisting with LLE.    TODAY'S TREATMENT: 08/30/21 ***  08/27/21 TherEx -Sci fit bike L5 8 min -Leg press back 45* DL 87# moving into max  flexion holding 5 seconds then max extension holding 5 sec 1 x 20 reps, RLE 50# 5s hold 1 x 20 reps -Staircase knee flexion stretch 10 x 10s -Staircase heel prop hamstring stretch 3 x 20s -Slantboard stretch 30 sec X3 -Seated knee extension machine 10# DL with slow eccentrics and holding flexion stretch 3 seconds (set up with 2nd notch from most extension ROM)  Manual -Rt knee PROM in flexion and extension with overpressure and mobs grade 3-4. IASTM to hamstrings In prone.   08/23/21 TherEx -Leg press back 45* DL 87# moving into max flexion holding 5 seconds then max extension holding 5 sec 1 x 20 reps, RLE 43# 5s hold 1 x 20 reps -Staircase knee flexion stretch 2 x 20s -Staircase heel prop hamstring stretch 2 x 20s -Staircase standing gastroc stretch 2 x 20s  -Staircase stretches videoed on pt phone and added to HEP, pt verbalized understanding -Seated LAQ with active knee flexion and contralateral motion x 10 reps -Supine gravity assist knee flexion x 10 reps  TherAct -11 step staircase alternating pattern desc/ascend x 1 flight with LUE rail, x 1 flight no UE support - one descending step with decreased Rt quad control, full control in rest of steps  Manual -End range extension and flexion grade 2/3 mobs during leg press & supine  08/21/21 TherEx -Leg press back 45* DL 81# moving into max flexion holding 5 seconds then max extension holding 5 sec 1 x 20 reps, Back flat DL 81# 5s flex/ext hold 1 x 15 reps, RLE 37# 5s hold 1 x 15 reps - PT manual assist for extension range -Standing calf stretch on incline 2 x 30s -Standing calf raises 2 x 10 reps -Standing quad stretch 2 x 30s -Seated leg extension machine BLE 20# x 15 reps, BLE concentric with RLE 10# x 15 reps with 3s hold and slow eccentric -Seated leg curl BLE 35# x 15 reps, RLE 15# x 15 reps -PT education for leg machines at gym, weight indications, exercise pacing, pt verbalized understanding  Manual -Knee extension grade 3  mobs with strap during standing calf stretch 2 x 12  08/16/21 TherEx -Leg press 45* DL 81# moving into max flexion holding 5 seconds then max extension holding 5 sec 1 x 20 reps, Back flat DL 81# 5s flex/ext hold 1 x 15 reps, RLE 37# 5s hold 1 x 15 reps - verbal and tactile cues for knee extension  TherAct -PT verbal cues for Rt knee extension in stance including in turns with demo, pt returned demo  Manual -IASTM in knee flexion and extension to medial and lateral hamstring tendons, joint line ligaments - PT palpated less tightness in hamstring tendons following and pt reported less pain with active and passive knee flexion to end range -Knee flexion PROM to end range seated and supine hip extended with knee over edge of table  Vaso R knee 10 minutes High Pressure 34* post-treatment with knee extension prop for 8 min  HOME EXERCISE PROGRAM: Access Code: [email or web address] URL: [email or web address] Date: 08/06/2021 Prepared by: Jamey Reas  Exercises - Supine Heel Slide with Strap  - 2-3 x daily - 7 x weekly -  2-3 sets - 10 reps - 5 seconds hold - Supine Quadriceps Stretch with Strap on Table  - 2-3 x daily - 7 x weekly - 1 sets - 2-3 reps - 20-30 seconds hold - Seated Knee Flexion Stretch  - 2-5 x daily - 7 x weekly - 2-3 sets - 10 reps - 5 sec hold - Seated Passive Knee Extension with Weight  - 2-5 x daily - 7 x weekly - 1 sets - 1 reps - 5-10 minutes hold - Seated Hamstring Stretch with Strap  - 2-4 x daily - 7 x weekly - 1 sets - 3 reps - 20-30 seconds hold - Sit to Stand with Counter Support  - 2-4 x daily - 7 x weekly - 2-3 sets - 10 reps - Gastroc Stretch on Step  - 2-4 x daily - 7 x weekly - 2-3 sets - 10 reps - 20-30 seconds hold - Standing Hamstring Stretch with Step  - 1-2 x daily - 7 x weekly - 1 sets - 3-10 reps - 20-30 seconds hold - Standing Knee Flexion Stretch on Step  - 1-2 x daily - 7 x weekly - 1 sets - 3-10 reps - 20-30 seconds hold - Prone Knee Extension  with Ankle Weight  - 1-2 x daily - 7 x weekly - 1 sets - 1 reps - 5-10 minutes hold - Prone Knee Flexion Extension AROM  - 1-2 x daily - 7 x weekly - 2 sets - 10 reps - 5 seconds hold - Prone Hip Internal Rotation AROM  - 1-2 x daily - 7 x weekly - 2 sets - 10 reps - 5 seconds hold - Prone Hip External Rotation AROM  - 1-2 x daily - 7 x weekly - 2 sets - 10 reps - 5 seconds hold - Prone Quadriceps Stretch with Strap  - 1-2 x daily - 7 x weekly - 1 sets - 3 reps - 20-30 seconds hold   ASSESSMENT:   CLINICAL IMPRESSION:   *** She felt more stiff today so worked on ROM emphasis. She reports improvement after session. PT recommending to continue POC.  OBJECTIVE IMPAIRMENTS Abnormal gait, decreased activity tolerance, decreased balance, decreased knowledge of condition, decreased knowledge of use of DME, decreased mobility, difficulty walking, decreased ROM, decreased strength, increased edema, increased muscle spasms, impaired flexibility, postural dysfunction, and pain.    ACTIVITY LIMITATIONS community activity, driving, and personal recreation like Yoga & walking dogs .    PERSONAL FACTORS 1-2 comorbidities: see PMH  are also affecting patient's functional outcome.    REHAB POTENTIAL: Good   CLINICAL DECISION MAKING: Stable/uncomplicated   EVALUATION COMPLEXITY: Low     GOALS: Goals reviewed with patient? Yes  SHORT TERM GOALS: Target date: 08/31/2021  Patient independent with updated HEP, including exercise at gym and with progression to yoga. Baseline: SEE OBJECTIVE DATA Goal status: NEW 08/14/2021  2.  Right Knee AROM seated LAQ to -6* extension Baseline: SEE OBJECTIVE DATA Goal status: ongoing 08/23/21   3.  PROM right knee extension -3* to flexion 90* Baseline: SEE OBJECTIVE DATA Goal status: Met 08/23/21  LONG TERM GOALS: Target date: 09/21/2021   Patient will improve FOTO score to 67% Baseline: SEE OBJECTIVE DATA Goal status: MET 08/14/2021   2.  Patient reports right knee  pain </= 2/10 with standing & gait activities.  Baseline: SEE OBJECTIVE DATA Goal status: ongoing 08/14/2021   3.  Right Knee PROM 0* extension to 110* flexion Baseline: SEE OBJECTIVE DATA Goal  status: ongoing 08/14/2021   4.  Right Knee AROM seated -3* extension to 100* flexion Baseline: SEE OBJECTIVE DATA Goal status: ongoing 08/14/2021   5.  Patient ambulates >500' community distances including negotiating ramps, curbs & stairs without device independently. Baseline: SEE OBJECTIVE DATA Goal status:ongoing 08/14/2021   6.  Patient reports ability to perform Yoga & walk her dogs. Baseline: SEE OBJECTIVE DATA Goal status: ongoing 08/14/2021   PLAN: PT FREQUENCY: 2x/week   PT DURATION: 6 additional weeks   PLANNED INTERVENTIONS: Therapeutic exercises, Therapeutic activity, Neuromuscular re-education, Balance training, Gait training, Patient/Family education, Joint mobilization, Stair training, Aquatic training, DME instructions, Electrical stimulation, Cryotherapy, Moist heat, scar mobilization, Taping, Vasopneumatic device, physical performance testing and Manual therapy   PLAN FOR NEXT SESSION: *** Progressive quad strengthening with alternating knee flexion/extension activity, manual therapy to improve passive range  Elsie Ra, PT, DPT 08/29/21 4:51 PM

## 2021-08-30 ENCOUNTER — Ambulatory Visit (INDEPENDENT_AMBULATORY_CARE_PROVIDER_SITE_OTHER): Payer: No Typology Code available for payment source | Admitting: Physical Therapy

## 2021-08-30 ENCOUNTER — Encounter: Payer: Self-pay | Admitting: Physical Therapy

## 2021-08-30 DIAGNOSIS — M25561 Pain in right knee: Secondary | ICD-10-CM

## 2021-08-30 DIAGNOSIS — R2689 Other abnormalities of gait and mobility: Secondary | ICD-10-CM

## 2021-08-30 DIAGNOSIS — M25661 Stiffness of right knee, not elsewhere classified: Secondary | ICD-10-CM | POA: Diagnosis not present

## 2021-08-30 DIAGNOSIS — M6281 Muscle weakness (generalized): Secondary | ICD-10-CM

## 2021-08-30 DIAGNOSIS — R600 Localized edema: Secondary | ICD-10-CM

## 2021-08-30 DIAGNOSIS — R2681 Unsteadiness on feet: Secondary | ICD-10-CM

## 2021-09-03 ENCOUNTER — Encounter: Payer: No Typology Code available for payment source | Admitting: Physical Therapy

## 2021-09-10 ENCOUNTER — Ambulatory Visit (INDEPENDENT_AMBULATORY_CARE_PROVIDER_SITE_OTHER): Payer: No Typology Code available for payment source | Admitting: Physical Therapy

## 2021-09-10 ENCOUNTER — Encounter: Payer: Self-pay | Admitting: Physical Therapy

## 2021-09-10 DIAGNOSIS — R2689 Other abnormalities of gait and mobility: Secondary | ICD-10-CM

## 2021-09-10 DIAGNOSIS — R2681 Unsteadiness on feet: Secondary | ICD-10-CM

## 2021-09-10 DIAGNOSIS — M25661 Stiffness of right knee, not elsewhere classified: Secondary | ICD-10-CM | POA: Diagnosis not present

## 2021-09-10 DIAGNOSIS — M25561 Pain in right knee: Secondary | ICD-10-CM

## 2021-09-10 DIAGNOSIS — R600 Localized edema: Secondary | ICD-10-CM

## 2021-09-10 DIAGNOSIS — M6281 Muscle weakness (generalized): Secondary | ICD-10-CM | POA: Diagnosis not present

## 2021-09-10 NOTE — Therapy (Signed)
OUTPATIENT PHYSICAL THERAPY TREATMENT NOTE   Patient Name: Cheryl Tate MRN: [id number] DOB:12/08/1970, 51 y.o., female Today's Date: 09/10/2021   END OF SESSION:   PT End of Session - 09/10/21 0847     Visit Number 42    Number of Visits 48    Date for PT Re-Evaluation 09/21/21    Authorization Type U[employer]    Authorization Time Period [facility] CHOICE  NO VISIT LIMIT, PATIENT PAYS 100% UNTIL DED IS MET, DED $7500 [id number] MET, TERM DATE 09/19/2021    Progress Note Due on Visit 42    PT Start Time 0845    PT Stop Time 0929    PT Time Calculation (min) 44 min    Activity Tolerance Patient tolerated treatment well    Behavior During Therapy WFL for tasks assessed/performed             Past Medical History:  Diagnosis Date   Anemia    Depression    Menorrhagia    Mood disorder (HCC)    PONV (postoperative nausea and vomiting)    Von Willebrand's disease ([place])    followed by hemotologist Dr. Marin Olp   Past Surgical History:  Procedure Laterality Date   APPENDECTOMY  01/22/2004   DILATION AND CURETTAGE OF UTERUS  2002   x 2 in 2002   DILITATION & CURRETTAGE/HYSTROSCOPY WITH HYDROTHERMAL ABLATION N/A 11/22/2014   Procedure: HYSTEROSCOPY WITH HYDROTHERMAL ABLATION;  Surgeon: Dian Queen, MD;  Location: [place];  Service: Gynecology;  Laterality: N/A;   EXAM UNDER ANESTHESIA WITH MANIPULATION OF KNEE Right 07/12/2021   Procedure: RIGHT EXAM UNDER ANESTHESIA WITH MANIPULATION OF KNEE;  Surgeon: Meredith Pel, MD;  Location: [place];  Service: Orthopedics;  Laterality: Right;   HYSTEROSCOPY WITH D & C  08-08-2014  in [place]   MULTIPLE TOOTH EXTRACTIONS     TOTAL KNEE ARTHROPLASTY Right 05/22/2021   Procedure: RIGHT TOTAL KNEE ARTHROPLASTY;  Surgeon: Meredith Pel, MD;  Location: [place];  Service: Orthopedics;  Laterality: Right;   Patient Active Problem List   Diagnosis Date Noted   Fibrosis of right knee joint    Arthritis of right knee    S/P  knee replacement 05/22/2021   BMI 33.0-33.9,adult 06/16/2015   Von Willebrand disease ([place]) 06/13/2015   Depression 12/29/2014   Rhinitis, allergic 12/28/2014    PCP: Eunice Blase, MD   REFERRING PROVIDER: Donella Stade, PA-C   REFERRING DIAG: [phone] (ICD-10-CM) - History of total knee arthroplasty, unspecified laterality  ONSET DATE: 05/22/2021 Right TKA   THERAPY DIAG:  Stiffness of right knee, not elsewhere classified  Muscle weakness (generalized)  Acute pain of right knee  Other abnormalities of gait and mobility  Localized edema  Unsteadiness on feet  PERTINENT HISTORY: Von Willebrand disease, depression, Menorrhgia  PRECAUTIONS: None  SUBJECTIVE: She had a good time on her vacation. She reports some increased stiffness over the trip. She had the chance to do a lot of swimming and go to the [facility] there, using the nustep, recumbent elliptical, leg extension machine, and arm exercises. She hasn't been taking pain medication or gabapentin.  PAIN:  NPRS scale: 0/10 currently, highest 2/10 over vacation with stiffness pain Pain location: right knee all over.  Pain description: sharp, sore, cramps Aggravating factors: weather, stiffness in morning Relieving factors: ice, meds   OBJECTIVE:    PATIENT SURVEYS:  05/28/2021 FOTO 29% functional & target 67% 08/14/2021 FOTO 71%   COGNITION: 05/28/2021 Overall cognitive status: Within  functional limits for tasks assessed                EDEMA: 05/28/2021 RLE above knee 51.8cm around knee 48cm below knee  41.2cm LLE above knee 45.8cm around knee 40cm below knee 34.1cm   PALPATION: 05/28/2021 Bruising along medial knee & lower leg, tenderness over joint line, quads, hamstrings   LE ROM:   ROM P: Passive  A: Active Right 05/28/21 Right 05/31/21 Right 06/05/21 Right 06/06/21 Right 06/11/21 Right 06/14/21 Right 06/19/21 Right 06/26/21 Right 06/28/21 Right 07/02/21 Right 07/13/21 Right 07/19/21 Right 07/20/21 Right 07/23/2021  Right 07/27/21 Right 07/30/21 Right 08/06/21 Right 08/08/21 Right 08/14/21 Right  08/16/21 Right 08/23/21 Right 08/30/21 Right 09/10/21  Hip flexion                          Hip extension                          Hip abduction                          Hip adduction                          Hip internal rotation                          Hip external rotation                          Knee flexion Supine P: 64* Seated  A: 64* Supine AROM heel slide:61 PROM overpressure in heel slide : 66   Supine AA: 73* Seated  P: 75*  Seated P: 79* Seated P: 75 Seated P: 70 with muscle guarding limiting Seated P:78 after manual therapy Seated P: 75*  A: 64* after manual therapy Supine A 70 P 78 AA 84 (after exercise) Seated P:85 After maual therapy Seated P:93 after manual therapy Seated P: 97*  Seated P: 98* Seated A: 85* P: 100* After manual therapy  Supine A: 94* P: 98* Seated A: 94* P: 105* Seated  P: 105* After manual Seated A: 96* P: 108* Supine gravity assist AA: 100* Seated A: 94* P: 98* Seated A: 90* P: 95*  Seated after manual A: 96* P: 101*  Knee extension Supine P: -10* Seated  A: -31* Seated AROM LAQ: -21 Seated LAQ A: -14* Supine P: -9* Supine A: -8 Supine P: -7* Supine A: -8 P:-6 Supine A:-8 P:-5  Seated P: -7* after manual therapy Seated  A -16 (seated LAQ)   Supine P: -9* Supine  P:-9 after manual therapy Supine P: -8* after manual Supine A: -15* P: -9* after manual Standing A: -9* after some manual Seated LAQ A: -15* Supine A: -13* P: -4*  Seated LAQ A: -15* Seated LAQ  posterior trunk lean  A: -13* Supine Heel prop A: -9* P: -2* Standing A: -6*  After IASTM Standing  A: -4* SLS Seated LAQ A: -12*  Ankle dorsiflexion                          Ankle plantarflexion                          Ankle inversion  Ankle eversion                           (Blank rows = not tested)   LE MMT:   MMT Right 05/28/2021  Left 05/28/2021 Right 08/03/2021  Hip flexion       Hip extension       Hip abduction       Hip adduction       Hip internal rotation       Hip external rotation       Knee flexion 2/5  4/5  Knee extension 2/5    Ankle dorsiflexion       Ankle plantarflexion       Ankle inversion       Ankle eversion        (Blank rows = not tested)   GAIT: 08/30/2021 no AD with even WB between BLE and even step length, remains with reduced Rt knee extension in stance  08/16/2021 no AD with improved Rt knee extension in stance and Rt knee flexion in swing  Pivot turns present with reduced Rt knee extension  08/03/2021  SPC use into clinic, reduced extension throughout gait cycle.   05/28/2021 Distance walked: 36' Assistive device utilized:  4 footed walker level of assistance: SBA  verbal cues needed Comments: antalgic, right knee flexed in stance, limited to no increase flexion for swing, RLE abducted,    Functional Activities: Pt using LLE to move RLE in & out of bed.  PT min guard RLE and she was able to move RLE without assisting with LLE.    TODAY'S TREATMENT: 09/10/21 TherEx -Sci fit bike L3.8 seat 7 for 8 min - pt self-selected resistance to match gym use. PT recommended having base resistance and increasing 2 increments for 1-2 min during cycling. Pt verbalized understanding.  -Standing gastroc stretch at step 3 x 30s  Yoga modifications with TKR to enable pt goal -Supine AAROM knee flexion stretch to resemble yoga pose 2 x 1 min. PT recommended matching LLE to RLE flexion.  Kneeling quadriped with pillow under knees -Quadruped cat cow x 30s -Quadruped knee flexion stretch 2 x 30s -Seated LAQ with active knee flexion and contralateral leg motion x 15 reps  Manual -Rt knee extension belted grade 3 mobs in standing gastroc stretch 3 x 10 -Rt knee flexion PROM with tibial distraction/IR  08/30/21 TherEx -Recumbent bike seat 4 rocking 8 min with 5 sec hold flexion -Knee flexion stretch  standing to seated x 6, visual demo and verbal cues for knee bend without heel rise -Seated SLR with abduction over obstacle x 8, verbal cueing for knee extension and pt reported anterior knee squeezing -Seated hamstring stretch 2 x 30s -Standing pre-gait drill to increase knee extension during RLE weight bearing  Manual -IASTM to lateral hamstrings and IT band in supine heel prop  Vaso R knee 10 minutes Med Pressure 34* post-treatment with RLE elevated  08/27/21 TherEx -Sci fit bike L5 8 min -Leg press back 45* DL 87# moving into max flexion holding 5 seconds then max extension holding 5 sec 1 x 20 reps, RLE 50# 5s hold 1 x 20 reps -Staircase knee flexion stretch 10 x 10s -Staircase heel prop hamstring stretch 3 x 20s -Slantboard stretch 30 sec X3 -Seated knee extension machine 10# DL with slow eccentrics and holding flexion stretch 3 seconds (set up with 2nd notch from most extension ROM)  Manual -Rt knee PROM in flexion  and extension with overpressure and mobs grade 3-4. IASTM to hamstrings In prone.    HOME EXERCISE PROGRAM: Access Code: [email or web address] URL: [email or web address] Date: 08/06/2021 Prepared by: Jamey Reas  Exercises - Supine Heel Slide with Strap  - 2-3 x daily - 7 x weekly - 2-3 sets - 10 reps - 5 seconds hold - Supine Quadriceps Stretch with Strap on Table  - 2-3 x daily - 7 x weekly - 1 sets - 2-3 reps - 20-30 seconds hold - Seated Knee Flexion Stretch  - 2-5 x daily - 7 x weekly - 2-3 sets - 10 reps - 5 sec hold - Seated Passive Knee Extension with Weight  - 2-5 x daily - 7 x weekly - 1 sets - 1 reps - 5-10 minutes hold - Seated Hamstring Stretch with Strap  - 2-4 x daily - 7 x weekly - 1 sets - 3 reps - 20-30 seconds hold - Sit to Stand with Counter Support  - 2-4 x daily - 7 x weekly - 2-3 sets - 10 reps - Gastroc Stretch on Step  - 2-4 x daily - 7 x weekly - 2-3 sets - 10 reps - 20-30 seconds hold - Standing Hamstring Stretch with Step  - 1-2 x  daily - 7 x weekly - 1 sets - 3-10 reps - 20-30 seconds hold - Standing Knee Flexion Stretch on Step  - 1-2 x daily - 7 x weekly - 1 sets - 3-10 reps - 20-30 seconds hold - Prone Knee Extension with Ankle Weight  - 1-2 x daily - 7 x weekly - 1 sets - 1 reps - 5-10 minutes hold - Prone Knee Flexion Extension AROM  - 1-2 x daily - 7 x weekly - 2 sets - 10 reps - 5 seconds hold - Prone Hip Internal Rotation AROM  - 1-2 x daily - 7 x weekly - 2 sets - 10 reps - 5 seconds hold - Prone Hip External Rotation AROM  - 1-2 x daily - 7 x weekly - 2 sets - 10 reps - 5 seconds hold - Prone Quadriceps Stretch with Strap  - 1-2 x daily - 7 x weekly - 1 sets - 3 reps - 20-30 seconds hold   ASSESSMENT:   CLINICAL IMPRESSION:   She remained active over vacation with swimming and gym attendance. She reported some increased stiffness with flexion and her active and passive flexion ranges today were decreased, but improved following manual knee flexion. Seated and standing knee extension AROM values both improved. She was advised to continue standing yoga and begin supine and quadruped positions within comfortable knee range symmetrically, and to return to PT with any specific poses that are limitations for her. She continues to benefit from PT.  OBJECTIVE IMPAIRMENTS Abnormal gait, decreased activity tolerance, decreased balance, decreased knowledge of condition, decreased knowledge of use of DME, decreased mobility, difficulty walking, decreased ROM, decreased strength, increased edema, increased muscle spasms, impaired flexibility, postural dysfunction, and pain.    ACTIVITY LIMITATIONS community activity, driving, and personal recreation like Yoga & walking dogs .    PERSONAL FACTORS 1-2 comorbidities: see PMH  are also affecting patient's functional outcome.    REHAB POTENTIAL: Good   CLINICAL DECISION MAKING: Stable/uncomplicated   EVALUATION COMPLEXITY: Low     GOALS: Goals reviewed with patient?  Yes  SHORT TERM GOALS: Target date: 08/31/2021  Patient independent with updated HEP, including exercise at gym and with progression to yoga. Baseline: SEE OBJECTIVE DATA Goal status:  met 08/30/2021  2.  Right Knee AROM seated LAQ to -6* extension Baseline: SEE OBJECTIVE DATA Goal status: ongoing 09/10/21   3.  PROM right knee extension -3* to flexion 90* Baseline: SEE OBJECTIVE DATA Goal status: Met 08/23/21  LONG TERM GOALS: Target date: 09/21/2021   Patient will improve FOTO score to 67% Baseline: SEE OBJECTIVE DATA Goal status: MET 08/14/2021   2.  Patient reports right knee pain </= 2/10 with standing & gait activities.  Baseline: SEE OBJECTIVE DATA Goal status: ongoing 09/10/2021   3.  Right Knee PROM 0* extension to 110* flexion Baseline: SEE OBJECTIVE DATA Goal status: ongoing 09/10/2021   4.  Right Knee AROM seated -3* extension to 100* flexion Baseline: SEE OBJECTIVE DATA Goal status: ongoing 09/10/2021   5.  Patient ambulates >500' community distances including negotiating ramps, curbs & stairs without device independently. Baseline: SEE OBJECTIVE DATA Goal status:ongoing 09/10/2021   6.  Patient reports ability to perform Yoga & walk her dogs. Baseline: SEE OBJECTIVE DATA Goal status: ongoing 09/10/2021   PLAN: PT FREQUENCY: 2x/week   PT DURATION: 6 additional weeks   PLANNED INTERVENTIONS: Therapeutic exercises, Therapeutic activity, Neuromuscular re-education, Balance training, Gait training, Patient/Family education, Joint mobilization, Stair training, Aquatic training, DME instructions, Electrical stimulation, Cryotherapy, Moist heat, scar mobilization, Taping, Vasopneumatic device, physical performance testing and Manual therapy   PLAN FOR NEXT SESSION: Progressive quad strengthening with alternating knee flexion/extension activity, manual therapy to improve passive range, continue encouraging discharge (target 9/1) planning for ongoing exercises  Jana Hakim,  SPT 09/10/2021 12:19 PM  This entire session of physical therapy was performed under the direct supervision of PT signing evaluation /treatment. PT reviewed note and agrees.  Jamey Reas, PT, DPT 09/10/21 12:25 PM

## 2021-09-13 ENCOUNTER — Encounter: Payer: Self-pay | Admitting: Physical Therapy

## 2021-09-13 ENCOUNTER — Ambulatory Visit (INDEPENDENT_AMBULATORY_CARE_PROVIDER_SITE_OTHER): Payer: No Typology Code available for payment source | Admitting: Physical Therapy

## 2021-09-13 DIAGNOSIS — M25661 Stiffness of right knee, not elsewhere classified: Secondary | ICD-10-CM

## 2021-09-13 DIAGNOSIS — R600 Localized edema: Secondary | ICD-10-CM

## 2021-09-13 DIAGNOSIS — R2689 Other abnormalities of gait and mobility: Secondary | ICD-10-CM

## 2021-09-13 DIAGNOSIS — M6281 Muscle weakness (generalized): Secondary | ICD-10-CM

## 2021-09-13 DIAGNOSIS — M25561 Pain in right knee: Secondary | ICD-10-CM

## 2021-09-13 DIAGNOSIS — R2681 Unsteadiness on feet: Secondary | ICD-10-CM

## 2021-09-13 NOTE — Therapy (Signed)
OUTPATIENT PHYSICAL THERAPY TREATMENT NOTE   Patient Name: Cheryl Tate MRN: [id number] DOB:19-May-1970, 51 y.o., female Today's Date: 09/13/2021   END OF SESSION:   PT End of Session - 09/13/21 0927     Visit Number 43    Number of Visits 48    Date for PT Re-Evaluation 09/21/21    Authorization Type [employer]    Authorization Time Period [employer] CHOICE  NO VISIT LIMIT, PATIENT PAYS 100% UNTIL DED IS MET, DED $7500 [id number] MET, TERM DATE 09/19/2021    Progress Note Due on Visit 70    PT Start Time 0930    PT Stop Time 1015    PT Time Calculation (min) 45 min    Activity Tolerance Patient tolerated treatment well    Behavior During Therapy WFL for tasks assessed/performed             Past Medical History:  Diagnosis Date   Anemia    Depression    Menorrhagia    Mood disorder (HCC)    PONV (postoperative nausea and vomiting)    Von Willebrand's disease ([place])    followed by hemotologist Dr. Marin Olp   Past Surgical History:  Procedure Laterality Date   APPENDECTOMY  01/22/2004   DILATION AND CURETTAGE OF UTERUS  2002   x 2 in 2002   DILITATION & CURRETTAGE/HYSTROSCOPY WITH HYDROTHERMAL ABLATION N/A 11/22/2014   Procedure: HYSTEROSCOPY WITH HYDROTHERMAL ABLATION;  Surgeon: Dian Queen, MD;  Location: [facility];  Service: Gynecology;  Laterality: N/A;   EXAM UNDER ANESTHESIA WITH MANIPULATION OF KNEE Right 07/12/2021   Procedure: RIGHT EXAM UNDER ANESTHESIA WITH MANIPULATION OF KNEE;  Surgeon: Meredith Pel, MD;  Location: [facility];  Service: Orthopedics;  Laterality: Right;   HYSTEROSCOPY WITH D & C  08-08-2014  in [place]   MULTIPLE TOOTH EXTRACTIONS     TOTAL KNEE ARTHROPLASTY Right 05/22/2021   Procedure: RIGHT TOTAL KNEE ARTHROPLASTY;  Surgeon: Meredith Pel, MD;  Location: [facility];  Service: Orthopedics;  Laterality: Right;   Patient Active Problem List   Diagnosis Date Noted   Fibrosis of right knee joint    Arthritis of right knee    S/P  knee replacement 05/22/2021   BMI 33.0-33.9,adult 06/16/2015   Von Willebrand disease ([place]) 06/13/2015   Depression 12/29/2014   Rhinitis, allergic 12/28/2014    PCP: Eunice Blase, MD   REFERRING PROVIDER: Donella Stade, PA-C   REFERRING DIAG: [phone] (ICD-10-CM) - History of total knee arthroplasty, unspecified laterality  ONSET DATE: 05/22/2021 Right TKA   THERAPY DIAG:  Stiffness of right knee, not elsewhere classified  Muscle weakness (generalized)  Acute pain of right knee  Other abnormalities of gait and mobility  Localized edema  Unsteadiness on feet  PERTINENT HISTORY: Von Willebrand disease, depression, Menorrhgia  PRECAUTIONS: None  SUBJECTIVE: She reports she is going to the gym to help with her strength and ROM when not in PT. Denies pain today but concerned with her knee stiffness.   PAIN:  NPRS scale: 0/10 currently Pain location: right knee all over.  Pain description: sharp, sore, cramps Aggravating factors: weather, stiffness in morning Relieving factors: ice, meds   OBJECTIVE:    PATIENT SURVEYS:  05/28/2021 FOTO 29% functional & target 67% 08/14/2021 FOTO 71%   COGNITION: 05/28/2021 Overall cognitive status: Within functional limits for tasks assessed                EDEMA: 05/28/2021 RLE above knee 51.8cm around knee 48cm  below knee  41.2cm LLE above knee 45.8cm around knee 40cm below knee 34.1cm   PALPATION: 05/28/2021 Bruising along medial knee & lower leg, tenderness over joint line, quads, hamstrings   LE ROM:   ROM P: Passive  A: Active Right 05/28/21 Right 05/31/21 Right 06/05/21 Right 06/06/21 Right 06/11/21 Right 06/14/21 Right 06/19/21 Right 06/26/21 Right 06/28/21 Right 07/02/21 Right 07/13/21 Right 07/19/21 Right 07/20/21 Right 07/23/2021 Right 07/27/21 Right 07/30/21 Right 08/06/21 Right 08/08/21 Right 08/14/21 Right  08/16/21 Right 08/23/21 Right 08/30/21 Right 09/10/21 Right 09/13/21  Hip flexion                           Hip  extension                           Hip abduction                           Hip adduction                           Hip internal rotation                           Hip external rotation                           Knee flexion Supine P: 64* Seated  A: 64* Supine AROM heel slide:61 PROM overpressure in heel slide : 66   Supine AA: 73* Seated  P: 75*  Seated P: 79* Seated P: 75 Seated P: 70 with muscle guarding limiting Seated P:78 after manual therapy Seated P: 75*  A: 64* after manual therapy Supine A 70 P 78 AA 84 (after exercise) Seated P:85 After maual therapy Seated P:93 after manual therapy Seated P: 97*  Seated P: 98* Seated A: 85* P: 100* After manual therapy  Supine A: 94* P: 98* Seated A: 94* P: 105* Seated  P: 105* After manual Seated A: 96* P: 108* Supine gravity assist AA: 100* Seated A: 94* P: 98* Seated A: 90* P: 95*  Seated after manual A: 96* P: 101* Supine A:98 P:103  Knee extension Supine P: -10* Seated  A: -31* Seated AROM LAQ: -21 Seated LAQ A: -14* Supine P: -9* Supine A: -8 Supine P: -7* Supine A: -8 P:-6 Supine A:-8 P:-5  Seated P: -7* after manual therapy Seated  A -16 (seated LAQ)   Supine P: -9* Supine  P:-9 after manual therapy Supine P: -8* after manual Supine A: -15* P: -9* after manual Standing A: -9* after some manual Seated LAQ A: -15* Supine A: -13* P: -4*  Seated LAQ A: -15* Seated LAQ  posterior trunk lean  A: -13* Supine Heel prop A: -9* P: -2* Standing A: -6*  After IASTM Standing  A: -4* SLS Seated LAQ A: -12*   Ankle dorsiflexion                           Ankle plantarflexion                           Ankle inversion  Ankle eversion                            (Blank rows = not tested)   LE MMT:   MMT Right 05/28/2021 Left 05/28/2021 Right 08/03/2021  Hip flexion       Hip extension       Hip abduction       Hip adduction       Hip internal rotation        Hip external rotation       Knee flexion 2/5  4/5  Knee extension 2/5    Ankle dorsiflexion       Ankle plantarflexion       Ankle inversion       Ankle eversion        (Blank rows = not tested)   GAIT: 08/30/2021 no AD with even WB between BLE and even step length, remains with reduced Rt knee extension in stance  08/16/2021 no AD with improved Rt knee extension in stance and Rt knee flexion in swing  Pivot turns present with reduced Rt knee extension  08/03/2021  SPC use into clinic, reduced extension throughout gait cycle.   05/28/2021 Distance walked: 44' Assistive device utilized:  4 footed walker level of assistance: SBA  verbal cues needed Comments: antalgic, right knee flexed in stance, limited to no increase flexion for swing, RLE abducted,    Functional Activities: Pt using LLE to move RLE in & out of bed.  PT min guard RLE and she was able to move RLE without assisting with LLE.    TODAY'S TREATMENT: 09/13/21 [employer] -Sci fit bike L3. seat 6 for 8 min -   -Standing gastroc stretch at step 3 x 30s -Standing knee flexion lunge stretch 5 sec X 15 -TRX squats 5 sec 2X10 holding 5 sec at maximal flexion -TRX lunges X 10 bilat, holding 2 sec at max flexion -Kneeling quadriped with pillow under knees for knee flexion stretch 10 x 10s  Manual -Rt knee flexion PROM with overpressure and flexion mobs in prone and in supine.  09/10/21 [employer] -Sci fit bike L3.8 seat 7 for 8 min - pt self-selected resistance to match gym use. PT recommended having base resistance and increasing 2 increments for 1-2 min during cycling. Pt verbalized understanding.  -Standing gastroc stretch at step 3 x 30s  Yoga modifications with TKR to enable pt goal -Supine AAROM knee flexion stretch to resemble yoga pose 2 x 1 min. PT recommended matching LLE to RLE flexion.  Kneeling quadriped with pillow under knees -Quadruped cat cow x 30s -Quadruped knee flexion stretch 2 x 30s -Seated LAQ with active  knee flexion and contralateral leg motion x 15 reps  Manual -Rt knee extension belted grade 3 mobs in standing gastroc stretch 3 x 10 -Rt knee flexion PROM with tibial distraction/IR     HOME EXERCISE PROGRAM: Access Code: [email or web address] URL: [email or web address] Date: 08/06/2021 Prepared by: Jamey Reas  Exercises - Supine Heel Slide with Strap  - 2-3 x daily - 7 x weekly - 2-3 sets - 10 reps - 5 seconds hold - Supine Quadriceps Stretch with Strap on Table  - 2-3 x daily - 7 x weekly - 1 sets - 2-3 reps - 20-30 seconds hold - Seated Knee Flexion Stretch  - 2-5 x daily - 7 x weekly - 2-3 sets - 10 reps - 5 sec hold - Seated Passive Knee Extension with Weight  -  2-5 x daily - 7 x weekly - 1 sets - 1 reps - 5-10 minutes hold - Seated Hamstring Stretch with Strap  - 2-4 x daily - 7 x weekly - 1 sets - 3 reps - 20-30 seconds hold - Sit to Stand with Counter Support  - 2-4 x daily - 7 x weekly - 2-3 sets - 10 reps - Gastroc Stretch on Step  - 2-4 x daily - 7 x weekly - 2-3 sets - 10 reps - 20-30 seconds hold - Standing Hamstring Stretch with Step  - 1-2 x daily - 7 x weekly - 1 sets - 3-10 reps - 20-30 seconds hold - Standing Knee Flexion Stretch on Step  - 1-2 x daily - 7 x weekly - 1 sets - 3-10 reps - 20-30 seconds hold - Prone Knee Extension with Ankle Weight  - 1-2 x daily - 7 x weekly - 1 sets - 1 reps - 5-10 minutes hold - Prone Knee Flexion Extension AROM  - 1-2 x daily - 7 x weekly - 2 sets - 10 reps - 5 seconds hold - Prone Hip Internal Rotation AROM  - 1-2 x daily - 7 x weekly - 2 sets - 10 reps - 5 seconds hold - Prone Hip External Rotation AROM  - 1-2 x daily - 7 x weekly - 2 sets - 10 reps - 5 seconds hold - Prone Quadriceps Stretch with Strap  - 1-2 x daily - 7 x weekly - 1 sets - 3 reps - 20-30 seconds hold   ASSESSMENT:   CLINICAL IMPRESSION:   She was concerned with her flexion ROM so emphasized this today in session with stretching, strengthening into her max  tolerated flexion, and with manual therapy. Her knee ROM measurements did show progress after session. I added TRX squats and lunges for her to work on at [employer] and we took video on her phone so she can remember proper set up and technique. Continue current POC.  OBJECTIVE IMPAIRMENTS Abnormal gait, decreased activity tolerance, decreased balance, decreased knowledge of condition, decreased knowledge of use of DME, decreased mobility, difficulty walking, decreased ROM, decreased strength, increased edema, increased muscle spasms, impaired flexibility, postural dysfunction, and pain.    ACTIVITY LIMITATIONS community activity, driving, and personal recreation like Yoga & walking dogs .    PERSONAL FACTORS 1-2 comorbidities: see PMH  are also affecting patient's functional outcome.    REHAB POTENTIAL: Good   CLINICAL DECISION MAKING: Stable/uncomplicated   EVALUATION COMPLEXITY: Low     GOALS: Goals reviewed with patient? Yes  SHORT TERM GOALS: Target date: 08/31/2021  Patient independent with updated HEP, including exercise at gym and with progression to yoga. Baseline: SEE OBJECTIVE DATA Goal status: met 08/30/2021  2.  Right Knee AROM seated LAQ to -6* extension Baseline: SEE OBJECTIVE DATA Goal status: ongoing 09/10/21   3.  PROM right knee extension -3* to flexion 90* Baseline: SEE OBJECTIVE DATA Goal status: Met 08/23/21  LONG TERM GOALS: Target date: 09/21/2021   Patient will improve FOTO score to 67% Baseline: SEE OBJECTIVE DATA Goal status: MET 08/14/2021   2.  Patient reports right knee pain </= 2/10 with standing & gait activities.  Baseline: SEE OBJECTIVE DATA Goal status: ongoing 09/10/2021   3.  Right Knee PROM 0* extension to 110* flexion Baseline: SEE OBJECTIVE DATA Goal status: ongoing 09/10/2021   4.  Right Knee AROM seated -3* extension to 100* flexion Baseline: SEE OBJECTIVE DATA Goal status: ongoing 09/10/2021  5.  Patient ambulates >500' community  distances including negotiating ramps, curbs & stairs without device independently. Baseline: SEE OBJECTIVE DATA Goal status:ongoing 09/10/2021   6.  Patient reports ability to perform Yoga & walk her dogs. Baseline: SEE OBJECTIVE DATA Goal status: ongoing 09/10/2021   PLAN: PT FREQUENCY: 2x/week   PT DURATION: 6 additional weeks   PLANNED INTERVENTIONS: Therapeutic exercises, Therapeutic activity, Neuromuscular re-education, Balance training, Gait training, Patient/Family education, Joint mobilization, Stair training, Aquatic training, DME instructions, Electrical stimulation, Cryotherapy, Moist heat, scar mobilization, Taping, Vasopneumatic device, physical performance testing and Manual therapy   PLAN FOR NEXT SESSION: Progressive quad strengthening with alternating knee flexion/extension activity, manual therapy to improve passive range, continue encouraging discharge (target 9/1) planning for ongoing exercises  Elsie Ra, PT, DPT 09/13/21 9:28 AM

## 2021-09-17 ENCOUNTER — Telehealth: Payer: Self-pay | Admitting: *Deleted

## 2021-09-17 ENCOUNTER — Encounter: Payer: Self-pay | Admitting: Physical Therapy

## 2021-09-17 ENCOUNTER — Other Ambulatory Visit: Payer: Self-pay | Admitting: *Deleted

## 2021-09-17 ENCOUNTER — Inpatient Hospital Stay: Payer: No Typology Code available for payment source | Attending: Hematology & Oncology

## 2021-09-17 ENCOUNTER — Ambulatory Visit (INDEPENDENT_AMBULATORY_CARE_PROVIDER_SITE_OTHER): Payer: No Typology Code available for payment source | Admitting: Physical Therapy

## 2021-09-17 DIAGNOSIS — D6800 Von Willebrand disease, unspecified: Secondary | ICD-10-CM | POA: Insufficient documentation

## 2021-09-17 DIAGNOSIS — R2689 Other abnormalities of gait and mobility: Secondary | ICD-10-CM

## 2021-09-17 DIAGNOSIS — M25561 Pain in right knee: Secondary | ICD-10-CM

## 2021-09-17 DIAGNOSIS — M25661 Stiffness of right knee, not elsewhere classified: Secondary | ICD-10-CM

## 2021-09-17 DIAGNOSIS — R600 Localized edema: Secondary | ICD-10-CM

## 2021-09-17 DIAGNOSIS — M6281 Muscle weakness (generalized): Secondary | ICD-10-CM | POA: Diagnosis not present

## 2021-09-17 DIAGNOSIS — R2681 Unsteadiness on feet: Secondary | ICD-10-CM

## 2021-09-17 LAB — CBC WITH DIFFERENTIAL (CANCER CENTER ONLY)
Abs Immature Granulocytes: 0.02 10*3/uL (ref 0.00–0.07)
Basophils Absolute: 0 10*3/uL (ref 0.0–0.1)
Basophils Relative: 1 %
Eosinophils Absolute: 0.1 10*3/uL (ref 0.0–0.5)
Eosinophils Relative: 1 %
HCT: 35.4 % — ABNORMAL LOW (ref 36.0–46.0)
Hemoglobin: 11.3 g/dL — ABNORMAL LOW (ref 12.0–15.0)
Immature Granulocytes: 0 %
Lymphocytes Relative: 39 %
Lymphs Abs: 2.6 10*3/uL (ref 0.7–4.0)
MCH: 29.2 pg (ref 26.0–34.0)
MCHC: 31.9 g/dL (ref 30.0–36.0)
MCV: 91.5 fL (ref 80.0–100.0)
Monocytes Absolute: 0.5 10*3/uL (ref 0.1–1.0)
Monocytes Relative: 8 %
Neutro Abs: 3.3 10*3/uL (ref 1.7–7.7)
Neutrophils Relative %: 51 %
Platelet Count: 284 10*3/uL (ref 150–400)
RBC: 3.87 MIL/uL (ref 3.87–5.11)
RDW: 13.5 % (ref 11.5–15.5)
WBC Count: 6.5 10*3/uL (ref 4.0–10.5)
nRBC: 0 % (ref 0.0–0.2)

## 2021-09-17 LAB — COMPREHENSIVE METABOLIC PANEL
ALT: 13 U/L (ref 0–44)
AST: 15 U/L (ref 15–41)
Albumin: 4.5 g/dL (ref 3.5–5.0)
Alkaline Phosphatase: 52 U/L (ref 38–126)
Anion gap: 6 (ref 5–15)
BUN: 13 mg/dL (ref 6–20)
CO2: 28 mmol/L (ref 22–32)
Calcium: 8.9 mg/dL (ref 8.9–10.3)
Chloride: 105 mmol/L (ref 98–111)
Creatinine, Ser: 0.5 mg/dL (ref 0.44–1.00)
GFR, Estimated: >60 mL/min (ref >60)
Glucose, Bld: 86 mg/dL (ref 70–99)
Potassium: 3.9 mmol/L (ref 3.5–5.1)
Sodium: 139 mmol/L (ref 135–145)
Total Bilirubin: 0.4 mg/dL (ref 0.3–1.2)
Total Protein: 7.3 g/dL (ref 6.5–8.1)

## 2021-09-17 LAB — PROTIME-INR
INR: 1 (ref 0.8–1.2)
Prothrombin Time: 12.6 seconds (ref 11.4–15.2)

## 2021-09-17 LAB — APTT: aPTT: 30 seconds (ref 24–36)

## 2021-09-17 NOTE — Therapy (Signed)
OUTPATIENT PHYSICAL THERAPY TREATMENT NOTE   Patient Name: Cheryl Tate MRN: [id number] DOB:[birth year], 51 y.o., female Today's Date: 09/17/2021   END OF SESSION:   PT End of Session - 09/17/21 1015     Visit Number 44    Number of Visits 48    Date for PT Re-Evaluation 09/21/21    Authorization Type [employer]    Authorization Time Period [facility] CHOICE  NO VISIT LIMIT, PATIENT PAYS 100% UNTIL DED IS MET, DED $7500 [id number] MET, TERM DATE 09/19/2021    Progress Note Due on Visit 52    PT Start Time 1015    PT Stop Time 1059    PT Time Calculation (min) 44 min    Activity Tolerance Patient tolerated treatment well    Behavior During Therapy WFL for tasks assessed/performed             Past Medical History:  Diagnosis Date   Anemia    Depression    Menorrhagia    Mood disorder (HCC)    PONV (postoperative nausea and vomiting)    Von Willebrand's disease ([place])    followed by hemotologist Dr. Marin Olp   Past Surgical History:  Procedure Laterality Date   APPENDECTOMY  01/22/2004   DILATION AND CURETTAGE OF UTERUS  2002   x 2 in 2002   DILITATION & CURRETTAGE/HYSTROSCOPY WITH HYDROTHERMAL ABLATION N/A 11/22/2014   Procedure: HYSTEROSCOPY WITH HYDROTHERMAL ABLATION;  Surgeon: Dian Queen, MD;  Location: [place];  Service: Gynecology;  Laterality: N/A;   EXAM UNDER ANESTHESIA WITH MANIPULATION OF KNEE Right 07/12/2021   Procedure: RIGHT EXAM UNDER ANESTHESIA WITH MANIPULATION OF KNEE;  Surgeon: Meredith Pel, MD;  Location: [place];  Service: Orthopedics;  Laterality: Right;   HYSTEROSCOPY WITH D & C  08-08-2014  in [place]   MULTIPLE TOOTH EXTRACTIONS     TOTAL KNEE ARTHROPLASTY Right 05/22/2021   Procedure: RIGHT TOTAL KNEE ARTHROPLASTY;  Surgeon: Meredith Pel, MD;  Location: [place];  Service: Orthopedics;  Laterality: Right;   Patient Active Problem List   Diagnosis Date Noted   Fibrosis of right knee joint    Arthritis of right knee    S/P  knee replacement 05/22/2021   BMI 33.0-33.9,adult 06/16/2015   Von Willebrand disease ([place]) 06/13/2015   Depression 12/29/2014   Rhinitis, allergic 12/28/2014    PCP: Eunice Blase, MD   REFERRING PROVIDER: Donella Stade, PA-C   REFERRING DIAG: [phone] (ICD-10-CM) - History of total knee arthroplasty, unspecified laterality  ONSET DATE: 05/22/2021 Right TKA   THERAPY DIAG:  Stiffness of right knee, not elsewhere classified  Muscle weakness (generalized)  Acute pain of right knee  Other abnormalities of gait and mobility  Localized edema  Unsteadiness on feet  PERTINENT HISTORY: Von Willebrand disease, depression, Menorrhgia  PRECAUTIONS: None  SUBJECTIVE: She had increased pain this morning and took a tylenol, likely due to the weather. She went to the gym yesterday with biking and leg press, and she plans to go again today. She has increased stiffness in the morning. She has tried some yoga movements, including the quadruped motions on the bed with some increased knee pain.  PAIN:  NPRS scale: 0/10 currently Pain location: right knee all over.  Pain description: sharp, sore, cramps Aggravating factors: weather, stiffness in morning Relieving factors: ice, meds   OBJECTIVE:    PATIENT SURVEYS:  05/28/2021 FOTO 29% functional & target 67% 08/14/2021 FOTO 71%   COGNITION: 05/28/2021 Overall cognitive status: Within  functional limits for tasks assessed                EDEMA: 05/28/2021 RLE above knee 51.8cm around knee 48cm below knee  41.2cm LLE above knee 45.8cm around knee 40cm below knee 34.1cm   PALPATION: 05/28/2021 Bruising along medial knee & lower leg, tenderness over joint line, quads, hamstrings   LE ROM:   ROM P: Passive  A: Active Right 05/28/21 Right 05/31/21 Right 06/05/21 Right 06/06/21 Right 06/11/21 Right 06/14/21 Right 06/19/21 Right 06/26/21 Right 06/28/21 Right 07/02/21 Right 07/13/21 Right 07/19/21 Right 07/20/21 Right 07/23/2021 Right 07/27/21 Right  07/30/21 Right 08/06/21 Right 08/08/21 Right 08/14/21 Right  08/16/21 Right 08/23/21 Right 08/30/21 Right 09/10/21 Right 09/13/21  Hip flexion                           Hip extension                           Hip abduction                           Hip adduction                           Hip internal rotation                           Hip external rotation                           Knee flexion Supine P: 64* Seated  A: 64* Supine AROM heel slide:61 PROM overpressure in heel slide : 66   Supine AA: 73* Seated  P: 75*  Seated P: 79* Seated P: 75 Seated P: 70 with muscle guarding limiting Seated P:78 after manual therapy Seated P: 75*  A: 64* after manual therapy Supine A 70 P 78 AA 84 (after exercise) Seated P:85 After maual therapy Seated P:93 after manual therapy Seated P: 97*  Seated P: 98* Seated A: 85* P: 100* After manual therapy  Supine A: 94* P: 98* Seated A: 94* P: 105* Seated  P: 105* After manual Seated A: 96* P: 108* Supine gravity assist AA: 100* Seated A: 94* P: 98* Seated A: 90* P: 95*  Seated after manual A: 96* P: 101* Supine A:98 P:103  Knee extension Supine P: -10* Seated  A: -31* Seated AROM LAQ: -21 Seated LAQ A: -14* Supine P: -9* Supine A: -8 Supine P: -7* Supine A: -8 P:-6 Supine A:-8 P:-5  Seated P: -7* after manual therapy Seated  A -16 (seated LAQ)   Supine P: -9* Supine  P:-9 after manual therapy Supine P: -8* after manual Supine A: -15* P: -9* after manual Standing A: -9* after some manual Seated LAQ A: -15* Supine A: -13* P: -4*  Seated LAQ A: -15* Seated LAQ  posterior trunk lean  A: -13* Supine Heel prop A: -9* P: -2* Standing A: -6*  After IASTM Standing  A: -4* SLS Seated LAQ A: -12*   Ankle dorsiflexion                           Ankle plantarflexion  Ankle inversion                           Ankle eversion                            (Blank rows = not tested)   LE  MMT:   MMT Right 05/28/2021 Right 08/03/2021 Left 09/17/21 Right  09/17/21  Hip flexion       Hip extension       Hip abduction       Hip adduction       Hip internal rotation       Hip external rotation       Knee flexion 2/5 4/5 35.8# & 35.7#  24.0# & 26.8# RLE:LLE 71%  Knee extension 2/5  48.8# & 51.5# 45.4# & 46.8# RLE:LLE 93%  Ankle dorsiflexion       Ankle plantarflexion       Ankle inversion       Ankle eversion        (Blank rows = not tested)   GAIT: 08/30/2021 no AD with even WB between BLE and even step length, remains with reduced Rt knee extension in stance  08/16/2021 no AD with improved Rt knee extension in stance and Rt knee flexion in swing  Pivot turns present with reduced Rt knee extension  08/03/2021  SPC use into clinic, reduced extension throughout gait cycle.   05/28/2021 Distance walked: 11' Assistive device utilized:  4 footed walker level of assistance: SBA  verbal cues needed Comments: antalgic, right knee flexed in stance, limited to no increase flexion for swing, RLE abducted,    Functional Activities: Pt using LLE to move RLE in & out of bed.  PT min guard RLE and she was able to move RLE without assisting with LLE.    TODAY'S TREATMENT: 09/17/21 TherEx -Sci fit bike BLEs seat 6 for 8 min - 3 min level 1, 5 min level 3 -PT education to use pillow for quadruped yoga to reduce knee flexion during activity for comfort & her ability to control amount of knee flexion, pt verbalized understanding -Supine knee flexion gravity assist AAROM x 10 reps -Supine AAROM knee flexion stretch to resemble yoga pose 2 x 30s. PT instructed in increasing flexion actively first then passively. Pt verbalized & return demo understanding.  -Seated quad set with heel prop 5s hold AROM then 5 sec AAROM using her hands to manually increase ext range x 10 reps -Seated hamstring curl with blue band x 10 reps with 5s hold. PT instructed with verbal & demo cues for adding to HEP; pt  verbalized & return demo understanding.   Manual -Rt knee flexion PROM with tibial distraction/IR  09/13/21 [employer]Ex -Sci fit bike L3. seat 6 for 8 min -   -Standing gastroc stretch at step 3 x 30s -Standing knee flexion lunge stretch 5 sec X 15 -TRX squats 5 sec 2X10 holding 5 sec at maximal flexion -TRX lunges X 10 bilat, holding 2 sec at max flexion -Kneeling quadriped with pillow under knees for knee flexion stretch 10 x 10s  Manual -Rt knee flexion PROM with overpressure and flexion mobs in prone and in supine.  09/10/21 TherEx -Sci fit bike L3.8 seat 7 for 8 min - pt self-selected resistance to match gym use. PT recommended having base resistance and increasing 2 increments for 1-2 min during cycling. Pt verbalized understanding.  -  Standing gastroc stretch at step 3 x 30s  Yoga modifications with TKR to enable pt goal -Supine AAROM knee flexion stretch to resemble yoga pose 2 x 1 min. PT recommended matching LLE to RLE flexion.  Kneeling quadriped with pillow under knees -Quadruped cat cow x 30s -Quadruped knee flexion stretch 2 x 30s -Seated LAQ with active knee flexion and contralateral leg motion x 15 reps  Manual -Rt knee extension belted grade 3 mobs in standing gastroc stretch 3 x 10 -Rt knee flexion PROM with tibial distraction/IR     HOME EXERCISE PROGRAM: Access Code: [email or web address] URL: [email or web address] Date: 08/06/2021 Prepared by: Jamey Reas  Exercises - Supine Heel Slide with Strap  - 2-3 x daily - 7 x weekly - 2-3 sets - 10 reps - 5 seconds hold - Supine Quadriceps Stretch with Strap on Table  - 2-3 x daily - 7 x weekly - 1 sets - 2-3 reps - 20-30 seconds hold - Seated Knee Flexion Stretch  - 2-5 x daily - 7 x weekly - 2-3 sets - 10 reps - 5 sec hold - Seated Passive Knee Extension with Weight  - 2-5 x daily - 7 x weekly - 1 sets - 1 reps - 5-10 minutes hold - Seated Hamstring Stretch with Strap  - 2-4 x daily - 7 x weekly - 1 sets - 3  reps - 20-30 seconds hold - Sit to Stand with Counter Support  - 2-4 x daily - 7 x weekly - 2-3 sets - 10 reps - Gastroc Stretch on Step  - 2-4 x daily - 7 x weekly - 2-3 sets - 10 reps - 20-30 seconds hold - Standing Hamstring Stretch with Step  - 1-2 x daily - 7 x weekly - 1 sets - 3-10 reps - 20-30 seconds hold - Standing Knee Flexion Stretch on Step  - 1-2 x daily - 7 x weekly - 1 sets - 3-10 reps - 20-30 seconds hold - Prone Knee Extension with Ankle Weight  - 1-2 x daily - 7 x weekly - 1 sets - 1 reps - 5-10 minutes hold - Prone Knee Flexion Extension AROM  - 1-2 x daily - 7 x weekly - 2 sets - 10 reps - 5 seconds hold - Prone Hip Internal Rotation AROM  - 1-2 x daily - 7 x weekly - 2 sets - 10 reps - 5 seconds hold - Prone Hip External Rotation AROM  - 1-2 x daily - 7 x weekly - 2 sets - 10 reps - 5 seconds hold - Prone Quadriceps Stretch with Strap  - 1-2 x daily - 7 x weekly - 1 sets - 3 reps - 20-30 seconds hold   ASSESSMENT:   CLINICAL IMPRESSION:   She presented today with some increased pain and she and PT discussed discharge planning, including expected functional and knee-specific limitations with suggestion to continue activity beyond leaving PT. Her Rt knee extension strength is 93% of Lt and knee flexion is 71% of Lt, both above expected levels for discharge and she was given an additional hamstring strengthening activity to use on days not attending gym. She is expected to discharge from PT at next appointment, likely meet most of LTGs but may not fully meet range goals though she has significantly improved her range.  OBJECTIVE IMPAIRMENTS Abnormal gait, decreased activity tolerance, decreased balance, decreased knowledge of condition, decreased knowledge of use of DME, decreased mobility, difficulty walking, decreased ROM, decreased strength, increased edema, increased muscle spasms,  impaired flexibility, postural dysfunction, and pain.    ACTIVITY LIMITATIONS community  activity, driving, and personal recreation like Yoga & walking dogs .    PERSONAL FACTORS 1-2 comorbidities: see PMH  are also affecting patient's functional outcome.    REHAB POTENTIAL: Good   CLINICAL DECISION MAKING: Stable/uncomplicated   EVALUATION COMPLEXITY: Low     GOALS: Goals reviewed with patient? Yes  SHORT TERM GOALS: Target date: 08/31/2021  Patient independent with updated HEP, including exercise at gym and with progression to yoga. Baseline: SEE OBJECTIVE DATA Goal status: met 08/30/2021  2.  Right Knee AROM seated LAQ to -6* extension Baseline: SEE OBJECTIVE DATA Goal status: ongoing 09/10/21   3.  PROM right knee extension -3* to flexion 90* Baseline: SEE OBJECTIVE DATA Goal status: Met 08/23/21  LONG TERM GOALS: Target date: 09/21/2021   Patient will improve FOTO score to 67% Baseline: SEE OBJECTIVE DATA Goal status: MET 08/14/2021   2.  Patient reports right knee pain </= 2/10 with standing & gait activities.  Baseline: SEE OBJECTIVE DATA Goal status: ongoing 09/10/2021   3.  Right Knee PROM 0* extension to 110* flexion Baseline: SEE OBJECTIVE DATA Goal status: ongoing 09/10/2021   4.  Right Knee AROM seated -3* extension to 100* flexion Baseline: SEE OBJECTIVE DATA Goal status: ongoing 09/10/2021   5.  Patient ambulates >500' community distances including negotiating ramps, curbs & stairs without device independently. Baseline: SEE OBJECTIVE DATA Goal status:ongoing 09/10/2021   6.  Patient reports ability to perform Yoga & walk her dogs. Baseline: SEE OBJECTIVE DATA Goal status: ongoing 09/10/2021   PLAN: PT FREQUENCY: 2x/week   PT DURATION: 6 additional weeks   PLANNED INTERVENTIONS: Therapeutic exercises, Therapeutic activity, Neuromuscular re-education, Balance training, Gait training, Patient/Family education, Joint mobilization, Stair training, Aquatic training, DME instructions, Electrical stimulation, Cryotherapy, Moist heat, scar  mobilization, Taping, Vasopneumatic device, physical performance testing and Manual therapy   PLAN FOR NEXT SESSION: discharge (target 9/1) planning for ongoing exercises & gym exercise discussion, scifit, check LTGs including reassess AROM, PROM & FOTO, update HEP and discuss yoga suggestions  Jana Hakim, SPT 09/17/2021, 11:15 AM  This entire session of physical therapy was performed under the direct supervision of PT signing evaluation /treatment. PT reviewed note and agrees.  Jamey Reas, PT, DPT 09/17/21 11:28 AM

## 2021-09-17 NOTE — Telephone Encounter (Signed)
Patient called stating that she needs a tooth extraction and is being seen in our office for Von Willebrands.  Asked if ok to get a tooth extraction.  Emeline Gins NP said she will need more recent labwork to see if she needs an infusion prior to tooth being extracted.  Appt made for today for labwork

## 2021-09-19 ENCOUNTER — Other Ambulatory Visit: Payer: Self-pay | Admitting: Family

## 2021-09-19 ENCOUNTER — Encounter: Payer: Self-pay | Admitting: Physical Therapy

## 2021-09-19 ENCOUNTER — Ambulatory Visit (INDEPENDENT_AMBULATORY_CARE_PROVIDER_SITE_OTHER): Payer: No Typology Code available for payment source | Admitting: Physical Therapy

## 2021-09-19 DIAGNOSIS — M25661 Stiffness of right knee, not elsewhere classified: Secondary | ICD-10-CM | POA: Diagnosis not present

## 2021-09-19 DIAGNOSIS — M25561 Pain in right knee: Secondary | ICD-10-CM

## 2021-09-19 DIAGNOSIS — M6281 Muscle weakness (generalized): Secondary | ICD-10-CM

## 2021-09-19 DIAGNOSIS — R2689 Other abnormalities of gait and mobility: Secondary | ICD-10-CM | POA: Diagnosis not present

## 2021-09-19 DIAGNOSIS — R2681 Unsteadiness on feet: Secondary | ICD-10-CM

## 2021-09-19 DIAGNOSIS — R600 Localized edema: Secondary | ICD-10-CM

## 2021-09-19 NOTE — Therapy (Addendum)
OUTPATIENT PHYSICAL THERAPY TREATMENT NOTE   Patient Name: Cheryl Tate MRN: [id number] DOB:12-26-1970, 51 y.o., female Today's Date: 09/19/2021  PHYSICAL THERAPY DISCHARGE SUMMARY  Visits from Start of Care: 45  Current functional level related to goals / functional outcomes: See assessment below   Remaining deficits: See assessment below   Education / Equipment: See assessment and objective below   Patient agrees to discharge. Patient goals were partially met. Patient is being discharged due to maximized rehab potential.   END OF SESSION:   PT End of Session - 09/19/21 1037     Visit Number 45    Number of Visits 48    Date for PT Re-Evaluation 09/21/21    Authorization Type [employer]    Authorization Time Period [facility] CHOICE  NO VISIT LIMIT, PATIENT PAYS 100% UNTIL DED IS MET, DED $7500 [id number] MET, TERM DATE 09/19/2021    Progress Note Due on Visit 44    PT Start Time 1015    PT Stop Time 1058    PT Time Calculation (min) 43 min    Activity Tolerance Patient tolerated treatment well    Behavior During Therapy WFL for tasks assessed/performed             Past Medical History:  Diagnosis Date   Anemia    Depression    Menorrhagia    Mood disorder (HCC)    PONV (postoperative nausea and vomiting)    Von Willebrand's disease ([place])    followed by hemotologist Dr. Marin Olp   Past Surgical History:  Procedure Laterality Date   APPENDECTOMY  01/22/2004   DILATION AND CURETTAGE OF UTERUS  2002   x 2 in 2002   DILITATION & CURRETTAGE/HYSTROSCOPY WITH HYDROTHERMAL ABLATION N/A 11/22/2014   Procedure: HYSTEROSCOPY WITH HYDROTHERMAL ABLATION;  Surgeon: Dian Queen, MD;  Location: [facility];  Service: Gynecology;  Laterality: N/A;   EXAM UNDER ANESTHESIA WITH MANIPULATION OF KNEE Right 07/12/2021   Procedure: RIGHT EXAM UNDER ANESTHESIA WITH MANIPULATION OF KNEE;  Surgeon: Meredith Pel, MD;  Location: [facility];  Service: Orthopedics;   Laterality: Right;   HYSTEROSCOPY WITH D & C  08-08-2014  in [place]   MULTIPLE TOOTH EXTRACTIONS     TOTAL KNEE ARTHROPLASTY Right 05/22/2021   Procedure: RIGHT TOTAL KNEE ARTHROPLASTY;  Surgeon: Meredith Pel, MD;  Location: [facility];  Service: Orthopedics;  Laterality: Right;   Patient Active Problem List   Diagnosis Date Noted   Fibrosis of right knee joint    Arthritis of right knee    S/P knee replacement 05/22/2021   BMI 33.0-33.9,adult 06/16/2015   Von Willebrand disease ([place]) 06/13/2015   Depression 12/29/2014   Rhinitis, allergic 12/28/2014    PCP: Eunice Blase, MD   REFERRING PROVIDER: Donella Stade, PA-C   REFERRING DIAG: [phone] (ICD-10-CM) - History of total knee arthroplasty, unspecified laterality  ONSET DATE: 05/22/2021 Right TKA   THERAPY DIAG:  Stiffness of right knee, not elsewhere classified  Muscle weakness (generalized)  Acute pain of right knee  Other abnormalities of gait and mobility  Localized edema  Unsteadiness on feet  PERTINENT HISTORY: Von Willebrand disease, depression, Menorrhgia  PRECAUTIONS: None  SUBJECTIVE: She has increased stiffness today, and didn't go to the gym this morning which could be part of it. She has no pain unless when doing exercises.  PAIN:  NPRS scale: 0/10 currently Pain location: right knee all over.  Pain description: sharp, sore, cramps Aggravating factors: weather, stiffness in morning  Relieving factors: ice, meds   OBJECTIVE:    PATIENT SURVEYS:  05/28/2021 FOTO 29% functional & target 67% 08/14/2021 FOTO 70.6% 09/19/2021  FOTO 70.4%   COGNITION: 05/28/2021 Overall cognitive status: Within functional limits for tasks assessed                EDEMA: 05/28/2021 RLE above knee 51.8cm around knee 48cm below knee  41.2cm LLE above knee 45.8cm around knee 40cm below knee 34.1cm   PALPATION: 05/28/2021 Bruising along medial knee & lower leg, tenderness over joint line, quads, hamstrings   LE ROM:    ROM P: Passive  A: Active Right 05/28/21 Right 05/31/21 Right 06/05/21 Right 06/06/21 Right 06/11/21 Right 06/14/21 Right 06/19/21 Right 06/26/21 Right 06/28/21 Right 07/02/21 Right 07/13/21 Right 07/19/21 Right 07/20/21 Right 07/23/2021 Right 07/27/21 Right 07/30/21 Right 08/06/21 Right 08/08/21 Right 08/14/21 Right  08/16/21 Right 08/23/21 Right 08/30/21 Right 09/10/21 Right 09/13/21 Right  09/19/21  Hip flexion                            Hip extension                            Hip abduction                            Hip adduction                            Hip internal rotation                            Hip external rotation                            Knee flexion Supine P: 64* Seated  A: 64* Supine AROM heel slide:61 PROM overpressure in heel slide : 66   Supine AA: 73* Seated  P: 75*  Seated P: 79* Seated P: 75 Seated P: 70 with muscle guarding limiting Seated P:78 after manual therapy Seated P: 75*  A: 64* after manual therapy Supine A 70 P 78 AA 84 (after exercise) Seated P:85 After maual therapy Seated P:93 after manual therapy Seated P: 97*  Seated P: 98* Seated A: 85* P: 100* After manual therapy  Supine A: 94* P: 98* Seated A: 94* P: 105* Seated  P: 105* After manual Seated A: 96* P: 108* Supine gravity assist AA: 100* Seated A: 94* P: 98* Seated A: 90* P: 95*  Seated after manual A: 96* P: 101* Supine A:98 P:103 Seated A: 95* P: 110* Supine  A: 96*  Knee extension Supine P: -10* Seated  A: -31* Seated AROM LAQ: -21 Seated LAQ A: -14* Supine P: -9* Supine A: -8 Supine P: -7* Supine A: -8 P:-6 Supine A:-8 P:-5  Seated P: -7* after manual therapy Seated  A -16 (seated LAQ)   Supine P: -9* Supine  P:-9 after manual therapy Supine P: -8* after manual Supine A: -15* P: -9* after manual Standing A: -9* after some manual Seated LAQ A: -15* Supine A: -13* P: -4*  Seated LAQ A: -15* Seated LAQ  posterior trunk lean  A:  -13* Supine Heel prop A: -9* P: -2* Standing A: -6*  After IASTM Standing  A: -4* SLS Seated LAQ A: -12*  Seated LAQ A: -12* Supine heel prop P: 0*  Ankle dorsiflexion                            Ankle plantarflexion                            Ankle inversion                            Ankle eversion                             (Blank rows = not tested)   LE MMT:   MMT Right 05/28/2021 Right 08/03/2021 Left 09/17/21 Right  09/17/21  Hip flexion       Hip extension       Hip abduction       Hip adduction       Hip internal rotation       Hip external rotation       Knee flexion 2/5 4/5 35.8# & 35.7#  24.0# & 26.8# RLE:LLE 71%  Knee extension 2/5  48.8# & 51.5# 45.4# & 46.8# RLE:LLE 93%  Ankle dorsiflexion       Ankle plantarflexion       Ankle inversion       Ankle eversion        (Blank rows = not tested)   GAIT: 08/30/2021 no AD with even WB between BLE and even step length, remains with reduced Rt knee extension in stance  08/16/2021 no AD with improved Rt knee extension in stance and Rt knee flexion in swing  Pivot turns present with reduced Rt knee extension  08/03/2021  SPC use into clinic, reduced extension throughout gait cycle.   05/28/2021 Distance walked: 34' Assistive device utilized:  4 footed walker level of assistance: SBA  verbal cues needed Comments: antalgic, right knee flexed in stance, limited to no increase flexion for swing, RLE abducted,    Functional Activities: Pt using LLE to move RLE in & out of bed.  PT min guard RLE and she was able to move RLE without assisting with LLE.    TODAY'S TREATMENT: 09/19/21 TherEx -Recumbent bike BLEs seat 4 rocking for 8 min and slow full revolutions 2 min -PT education on yoga and gym attendance with pacing strengthening exercises for improved strengthening and recovery, pt verbalized understanding -PT education on Gentle Lead and safe dog walking practice to reduce risk of injury, pt verbalized  understanding -Supine gravity AAROM knee flexion 5s hold x 5 reps -AROM/PROM measurements as noted in objective  09/17/21 TherEx -Sci fit bike BLEs seat 6 for 8 min - 3 min level 1, 5 min level 3 -PT education to use pillow for quadruped yoga to reduce knee flexion during activity for comfort & her ability to control amount of knee flexion, pt verbalized understanding -Supine knee flexion gravity assist AAROM x 10 reps -Supine AAROM knee flexion stretch to resemble yoga pose 2 x 30s. PT instructed in increasing flexion actively first then passively. Pt verbalized & return demo understanding.  -Seated quad set with heel prop 5s hold AROM then 5 sec AAROM using her hands to manually increase ext range x 10 reps -Seated hamstring curl with blue band x 10 reps with 5s hold.  PT instructed with verbal & demo cues for adding to HEP; pt verbalized & return demo understanding.   Manual -Rt knee flexion PROM with tibial distraction/IR  09/13/21 TherEx -Sci fit bike L3. seat 6 for 8 min -   -Standing gastroc stretch at step 3 x 30s -Standing knee flexion lunge stretch 5 sec X 15 -TRX squats 5 sec 2X10 holding 5 sec at maximal flexion -TRX lunges X 10 bilat, holding 2 sec at max flexion -Kneeling quadriped with pillow under knees for knee flexion stretch 10 x 10s  Manual -Rt knee flexion PROM with overpressure and flexion mobs in prone and in supine.  09/10/21 TherEx -Sci fit bike L3.8 seat 7 for 8 min - pt self-selected resistance to match gym use. PT recommended having base resistance and increasing 2 increments for 1-2 min during cycling. Pt verbalized understanding.  -Standing gastroc stretch at step 3 x 30s  Yoga modifications with TKR to enable pt goal -Supine AAROM knee flexion stretch to resemble yoga pose 2 x 1 min. PT recommended matching LLE to RLE flexion.  Kneeling quadriped with pillow under knees -Quadruped cat cow x 30s -Quadruped knee flexion stretch 2 x 30s -Seated LAQ with  active knee flexion and contralateral leg motion x 15 reps  Manual -Rt knee extension belted grade 3 mobs in standing gastroc stretch 3 x 10 -Rt knee flexion PROM with tibial distraction/IR     HOME EXERCISE PROGRAM: Access Code: [email or web address] URL: [email or web address] Date: 08/06/2021 Prepared by: Jamey Reas  Exercises - Supine Heel Slide with Strap  - 2-3 x daily - 7 x weekly - 2-3 sets - 10 reps - 5 seconds hold - Supine Quadriceps Stretch with Strap on Table  - 2-3 x daily - 7 x weekly - 1 sets - 2-3 reps - 20-30 seconds hold - Seated Knee Flexion Stretch  - 2-5 x daily - 7 x weekly - 2-3 sets - 10 reps - 5 sec hold - Seated Passive Knee Extension with Weight  - 2-5 x daily - 7 x weekly - 1 sets - 1 reps - 5-10 minutes hold - Seated Hamstring Stretch with Strap  - 2-4 x daily - 7 x weekly - 1 sets - 3 reps - 20-30 seconds hold - Sit to Stand with Counter Support  - 2-4 x daily - 7 x weekly - 2-3 sets - 10 reps - Gastroc Stretch on Step  - 2-4 x daily - 7 x weekly - 2-3 sets - 10 reps - 20-30 seconds hold - Standing Hamstring Stretch with Step  - 1-2 x daily - 7 x weekly - 1 sets - 3-10 reps - 20-30 seconds hold - Standing Knee Flexion Stretch on Step  - 1-2 x daily - 7 x weekly - 1 sets - 3-10 reps - 20-30 seconds hold - Prone Knee Extension with Ankle Weight  - 1-2 x daily - 7 x weekly - 1 sets - 1 reps - 5-10 minutes hold - Prone Knee Flexion Extension AROM  - 1-2 x daily - 7 x weekly - 2 sets - 10 reps - 5 seconds hold - Prone Hip Internal Rotation AROM  - 1-2 x daily - 7 x weekly - 2 sets - 10 reps - 5 seconds hold - Prone Hip External Rotation AROM  - 1-2 x daily - 7 x weekly - 2 sets - 10 reps - 5 seconds hold - Prone Quadriceps Stretch with Strap  - 1-2 x daily - 7 x  weekly - 1 sets - 3 reps - 20-30 seconds hold   ASSESSMENT:   CLINICAL IMPRESSION:   Simranjit was discharged today after meeting most short term and long term goals, reflecting significant  improvement in LE impairments and functional abilities. Her Rt knee flexion strength is > 70% of Lt and extension is > 90% of Lt, demonstrating improved active strength in the RL, though not meeting the AROM goals despite significant improvement in active range. Her passive ROM, perceived functional ability, community ambulation, pain, and ability to do tasks for recreation have all improved since evaluation as indicated below in LTGs. She was discharged to home with suggestions for continued exercise, and reported no further questions or concerns.  OBJECTIVE IMPAIRMENTS Abnormal gait, decreased activity tolerance, decreased balance, decreased knowledge of condition, decreased knowledge of use of DME, decreased mobility, difficulty walking, decreased ROM, decreased strength, increased edema, increased muscle spasms, impaired flexibility, postural dysfunction, and pain.    ACTIVITY LIMITATIONS community activity, driving, and personal recreation like Yoga & walking dogs .    PERSONAL FACTORS 1-2 comorbidities: see PMH  are also affecting patient's functional outcome.    REHAB POTENTIAL: Good   CLINICAL DECISION MAKING: Stable/uncomplicated   EVALUATION COMPLEXITY: Low     GOALS: Goals reviewed with patient? Yes  SHORT TERM GOALS: Target date: 08/31/2021  Patient independent with updated HEP, including exercise at gym and with progression to yoga. Baseline: SEE OBJECTIVE DATA Goal status: met 08/30/2021  2.  Right Knee AROM seated LAQ to -6* extension Baseline: SEE OBJECTIVE DATA Goal status: not met 09/19/2021   3.  PROM right knee extension -3* to flexion 90* Baseline: SEE OBJECTIVE DATA Goal status: Met 08/23/21  LONG TERM GOALS: Target date: 09/21/2021   Patient will improve FOTO score to 67% Baseline: SEE OBJECTIVE DATA Goal status: MET 08/14/2021   2.  Patient reports right knee pain </= 2/10 with standing & gait activities.  Baseline: SEE OBJECTIVE DATA Goal status: met  09/19/2021   3.  Right Knee PROM 0* extension to 110* flexion Baseline: SEE OBJECTIVE DATA Goal status: met 09/19/2021   4.  Right Knee AROM seated -3* extension to 100* flexion Baseline: SEE OBJECTIVE DATA Goal status: not met 09/19/2021 but significantly improved  5.  Patient ambulates >500' community distances including negotiating ramps, curbs & stairs without device independently. Baseline: SEE OBJECTIVE DATA Goal status: met 09/19/21   6.  Patient reports ability to perform Yoga & walk her dogs. Baseline: SEE OBJECTIVE DATA Goal status: met 09/19/2021   PLAN: PT FREQUENCY: 2x/week   PT DURATION: 6 additional weeks   PLANNED INTERVENTIONS: Therapeutic exercises, Therapeutic activity, Neuromuscular re-education, Balance training, Gait training, Patient/Family education, Joint mobilization, Stair training, Aquatic training, DME instructions, Electrical stimulation, Cryotherapy, Moist heat, scar mobilization, Taping, Vasopneumatic device, physical performance testing and Manual therapy   PLAN FOR NEXT SESSION: discharge from PT services today 09/19/21  Jana Hakim, SPT 09/19/2021, 1:08 PM  This entire session of physical therapy was performed under the direct supervision of PT signing evaluation /treatment. PT reviewed note and agrees.  Jamey Reas, PT, DPT 09/19/21 1:11 PM

## 2021-09-21 ENCOUNTER — Other Ambulatory Visit: Payer: Self-pay | Admitting: Family

## 2021-09-21 ENCOUNTER — Other Ambulatory Visit: Payer: Self-pay | Admitting: Hematology & Oncology

## 2021-09-21 DIAGNOSIS — D6800 Von Willebrand disease, unspecified: Secondary | ICD-10-CM

## 2021-09-21 LAB — VON WILLEBRAND PANEL
Coagulation Factor VIII: 38 % — ABNORMAL LOW (ref 56–140)
Ristocetin Co-factor, Plasma: 36 % — ABNORMAL LOW (ref 50–200)
Von Willebrand Antigen, Plasma: 42 % — ABNORMAL LOW (ref 50–200)

## 2021-09-21 LAB — COAG STUDIES INTERP REPORT

## 2021-09-21 MED ORDER — ANTIHEMOPHILIC FACTOR-VWF 250-600 UNITS IV SOLR: 40.0000 [IU]/kg | Freq: Every day | INTRAVENOUS | Status: DC

## 2021-09-25 ENCOUNTER — Telehealth: Payer: Self-pay

## 2021-09-25 NOTE — Telephone Encounter (Signed)
Pt called in stating that she was advised to get an infusion prior to her tooth extraction but does not believe she needs it. Pt states she has not needed this the last two extractions shes had and is not interested in getting it. Gaylord Shih who states that she has the right to decline it if she feels that she doesn't need it. Pt advised. Awaiting dentist office to fax over form to complete prior to extraction. Will state that pt declined infusion.

## 2021-10-15 ENCOUNTER — Encounter: Payer: Self-pay | Admitting: Hematology & Oncology

## 2021-10-24 ENCOUNTER — Ambulatory Visit: Payer: No Typology Code available for payment source | Admitting: Orthopedic Surgery

## 2021-11-02 ENCOUNTER — Ambulatory Visit (INDEPENDENT_AMBULATORY_CARE_PROVIDER_SITE_OTHER): Payer: 59 | Admitting: Orthopedic Surgery

## 2021-11-02 ENCOUNTER — Encounter: Payer: Self-pay | Admitting: Hematology & Oncology

## 2021-11-02 DIAGNOSIS — M24661 Ankylosis, right knee: Secondary | ICD-10-CM | POA: Diagnosis not present

## 2021-11-04 ENCOUNTER — Encounter: Payer: Self-pay | Admitting: Orthopedic Surgery

## 2021-11-04 NOTE — Progress Notes (Signed)
Office Visit Note   Patient: Cheryl Tate           Date of Birth: Jul 14, 1970           MRN: [id number] Visit Date: 11/02/2021 Requested by: Eunice Blase, MD [address] [place],  [place] [address] PCP: Eunice Blase, MD  Subjective: Chief Complaint  Patient presents with   Right Knee - Follow-up    HPI: Cheryl Tate is a 51 y.o. female who presents to the office reporting right knee pain and stiffness.  She underwent right total knee replacement 05/22/2021.  Manipulation under anesthesia 07/12/2021.  Has some frustration with bending the knee.  She has achieved full extension.  And overall she has achieved a lot more flexion than she had 6 weeks ago.  Her mother passed away in [place] and that did delay her rehab some.  She does yoga every morning.  Pain has improved but she still reports symptoms with flexion..                ROS: All systems reviewed are negative as they relate to the chief complaint within the history of present illness.  Patient denies fevers or chills.  Assessment & Plan: Visit Diagnoses:  1. Arthrofibrosis of knee joint, right     Plan: Impression is residual right knee arthrofibrosis which has improved.  We done 1 manipulation and really the only other thing to do at this time would be open synovectomy which I am not too keen on in this patient with blood clotting disorder.  She does have functional range of motion.  I think there is a chance that over the next 3 to 4 months with the lot of dedicated efforts she could get about 10 more degrees.  Plan to see her back as needed.  Follow-Up Instructions: No follow-ups on file.   Orders:  No orders of the defined types were placed in this encounter.  No orders of the defined types were placed in this encounter.     Procedures: No procedures performed   Clinical Data: No additional findings.  Objective: Vital Signs: LMP 08/05/2015   Physical Exam:  Constitutional:  Patient appears well-developed HEENT:  Head: Normocephalic Eyes:EOM are normal Neck: Normal range of motion Cardiovascular: Normal rate Pulmonary/chest: Effort normal Neurologic: Patient is alert Skin: Skin is warm Psychiatric: Patient has normal mood and affect  Ortho Exam: Ortho exam demonstrates intact extensor mechanism.  Range of motion today cold is 0-90.  Collaterals are stable to varus valgus stress at 0 30 degrees.  Patella tracks well.  Has pretty reasonable medial to lateral patella mobility as well in that right knee in extension  Specialty Comments:  No specialty comments available.  Imaging: No results found.   PMFS History: Patient Active Problem List   Diagnosis Date Noted   Fibrosis of right knee joint    Arthritis of right knee    S/P knee replacement 05/22/2021   BMI 33.0-33.9,adult 06/16/2015   Von Willebrand disease ([place]) 06/13/2015   Depression 12/29/2014   Rhinitis, allergic 12/28/2014   Past Medical History:  Diagnosis Date   Anemia    Depression    Menorrhagia    Mood disorder (HCC)    PONV (postoperative nausea and vomiting)    Von Willebrand's disease ([place])    followed by hemotologist Dr. Marin Olp    Family History  Problem Relation Age of Onset   Mental retardation Mother    Mental retardation Brother  Mental retardation Maternal Grandmother     Past Surgical History:  Procedure Laterality Date   APPENDECTOMY  01/22/2004   DILATION AND CURETTAGE OF UTERUS  2002   x 2 in 2002   DILITATION & CURRETTAGE/HYSTROSCOPY WITH HYDROTHERMAL ABLATION N/A 11/22/2014   Procedure: HYSTEROSCOPY WITH HYDROTHERMAL ABLATION;  Surgeon: Dian Queen, MD;  Location: [facility];  Service: Gynecology;  Laterality: N/A;   EXAM UNDER ANESTHESIA WITH MANIPULATION OF KNEE Right 07/12/2021   Procedure: RIGHT EXAM UNDER ANESTHESIA WITH MANIPULATION OF KNEE;  Surgeon: Meredith Pel, MD;  Location: [facility];  Service: Orthopedics;  Laterality:  Right;   HYSTEROSCOPY WITH D & C  08-08-2014  in [place]   MULTIPLE TOOTH EXTRACTIONS     TOTAL KNEE ARTHROPLASTY Right 05/22/2021   Procedure: RIGHT TOTAL KNEE ARTHROPLASTY;  Surgeon: Meredith Pel, MD;  Location: [facility];  Service: Orthopedics;  Laterality: Right;   Social History   Occupational History   Occupation: Biomedical scientist, Tree surgeon  Tobacco Use   Smoking status: Every Day    Packs/day: 0.20    Years: 25.00    Total pack years: 5.00    Types: Cigarettes   Smokeless tobacco: Never   Tobacco comments:    2 cig daily  Vaping Use   Vaping Use: Never used  Substance and Sexual Activity   Alcohol use: Yes    Alcohol/week: 7.0 standard drinks of alcohol    Types: 7 Glasses of wine per week    Comment: one wine daily   Drug use: Yes    Types: Marijuana    Comment: helps with sleep nightly   Sexual activity: Yes

## 2022-02-11 ENCOUNTER — Encounter: Payer: Self-pay | Admitting: Hematology & Oncology

## 2022-02-13 ENCOUNTER — Encounter: Payer: Self-pay | Admitting: Hematology & Oncology

## 2022-02-13 ENCOUNTER — Ambulatory Visit (INDEPENDENT_AMBULATORY_CARE_PROVIDER_SITE_OTHER): Payer: BLUE CROSS/BLUE SHIELD | Admitting: Physical Therapy

## 2022-02-13 ENCOUNTER — Other Ambulatory Visit: Payer: Self-pay

## 2022-02-13 ENCOUNTER — Encounter: Payer: Self-pay | Admitting: Physical Therapy

## 2022-02-13 DIAGNOSIS — M25511 Pain in right shoulder: Secondary | ICD-10-CM

## 2022-02-13 DIAGNOSIS — G8929 Other chronic pain: Secondary | ICD-10-CM

## 2022-02-13 NOTE — Therapy (Signed)
OUTPATIENT PHYSICAL THERAPY SHOULDER EVALUATION   Patient Name: Cheryl Tate MRN: [id number] DOB:[birth year], 52 y.o., female Today's Date: 02/13/2022  END OF SESSION:  PT End of Session - 02/13/22 0900     Visit Number 0             Past Medical History:  Diagnosis Date   Anemia    Depression    Menorrhagia    Mood disorder (HCC)    PONV (postoperative nausea and vomiting)    Von Willebrand's disease ([place])    followed by hemotologist Dr. Marin Olp   Past Surgical History:  Procedure Laterality Date   APPENDECTOMY  01/22/2004   DILATION AND CURETTAGE OF UTERUS  2002   x 2 in 2002   DILITATION & CURRETTAGE/HYSTROSCOPY WITH HYDROTHERMAL ABLATION N/A 11/22/2014   Procedure: HYSTEROSCOPY WITH HYDROTHERMAL ABLATION;  Surgeon: Dian Queen, MD;  Location: [facility];  Service: Gynecology;  Laterality: N/A;   EXAM UNDER ANESTHESIA WITH MANIPULATION OF KNEE Right 07/12/2021   Procedure: RIGHT EXAM UNDER ANESTHESIA WITH MANIPULATION OF KNEE;  Surgeon: Meredith Pel, MD;  Location: [facility];  Service: Orthopedics;  Laterality: Right;   HYSTEROSCOPY WITH D & C  08-08-2014  in [place]   MULTIPLE TOOTH EXTRACTIONS     TOTAL KNEE ARTHROPLASTY Right 05/22/2021   Procedure: RIGHT TOTAL KNEE ARTHROPLASTY;  Surgeon: Meredith Pel, MD;  Location: [facility];  Service: Orthopedics;  Laterality: Right;   Patient Active Problem List   Diagnosis Date Noted   Fibrosis of right knee joint    Arthritis of right knee    S/P knee replacement 05/22/2021   BMI 33.0-33.9,adult 06/16/2015   Von Willebrand disease ([place]) 06/13/2015   Depression 12/29/2014   Rhinitis, allergic 12/28/2014    PCP: Eunice Blase, MD  REFERRING PROVIDER: Eunice Blase, MD  REFERRING DIAG: M75.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  Recently became more painful waking her up.  On 02/07/2022, Dr. Junius Roads used diagnostic US and diagnosed calcium deposits.    PERTINENT HISTORY: Right TKA 05/22/21, Von Willebrand disease, depression, Menorrhgia    Patient found out that [name] is out of her insurance Network so her cost was too  high & she choose to terminate PT evaluation & seek PT care at an in Network facility.    Jamey Reas, PT, DPT 02/13/2022, 9:00 AM

## 2022-12-13 ENCOUNTER — Encounter: Payer: Self-pay | Admitting: Family Medicine

## 2022-12-13 ENCOUNTER — Other Ambulatory Visit: Payer: Self-pay | Admitting: Obstetrics and Gynecology

## 2022-12-13 DIAGNOSIS — R928 Other abnormal and inconclusive findings on diagnostic imaging of breast: Secondary | ICD-10-CM

## 2023-01-01 ENCOUNTER — Other Ambulatory Visit: Payer: BLUE CROSS/BLUE SHIELD

## 2023-05-20 ENCOUNTER — Other Ambulatory Visit: Payer: Self-pay | Admitting: Family Medicine

## 2023-05-20 DIAGNOSIS — E278 Other specified disorders of adrenal gland: Secondary | ICD-10-CM

## 2023-07-15 IMAGING — CR DG KNEE 3 VIEWS*R*
4 series · 4 of 4 positions shown · non-contrast
Comparison: None.

CLINICAL DATA: Contusion of right knee, fall. Pain in lateral upper
tibia area

EXAM:
RIGHT KNEE - 3 VIEW

[w knee ap right]
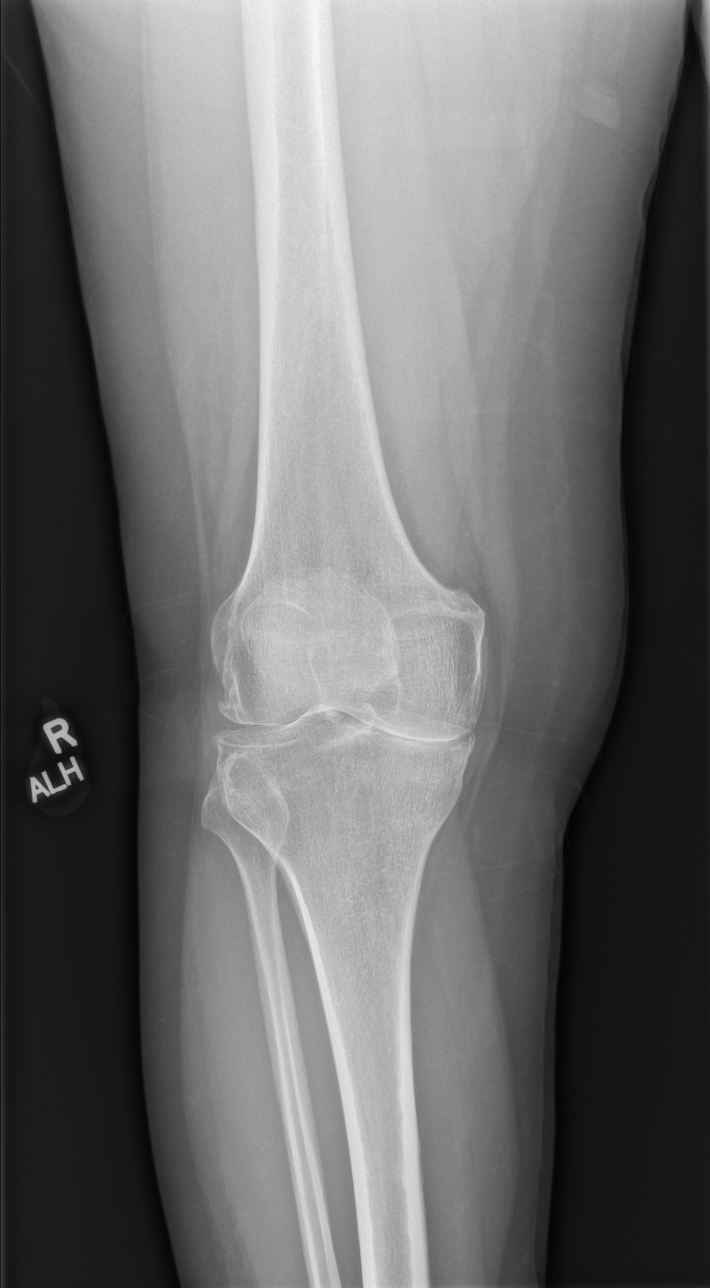

[w knee lat right (1 of 2)]
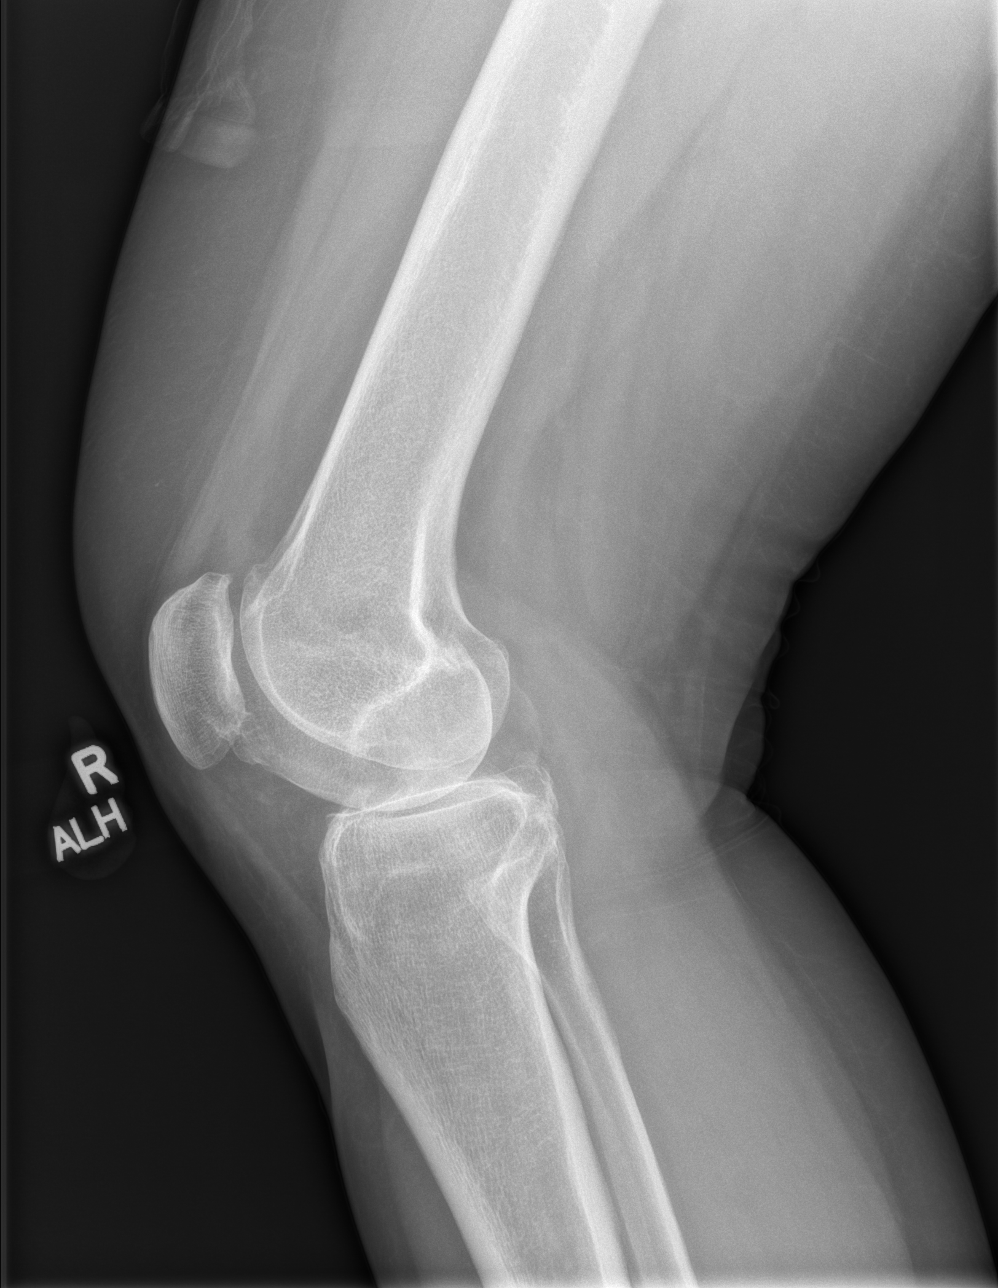

[w knee lat right (2 of 2)]
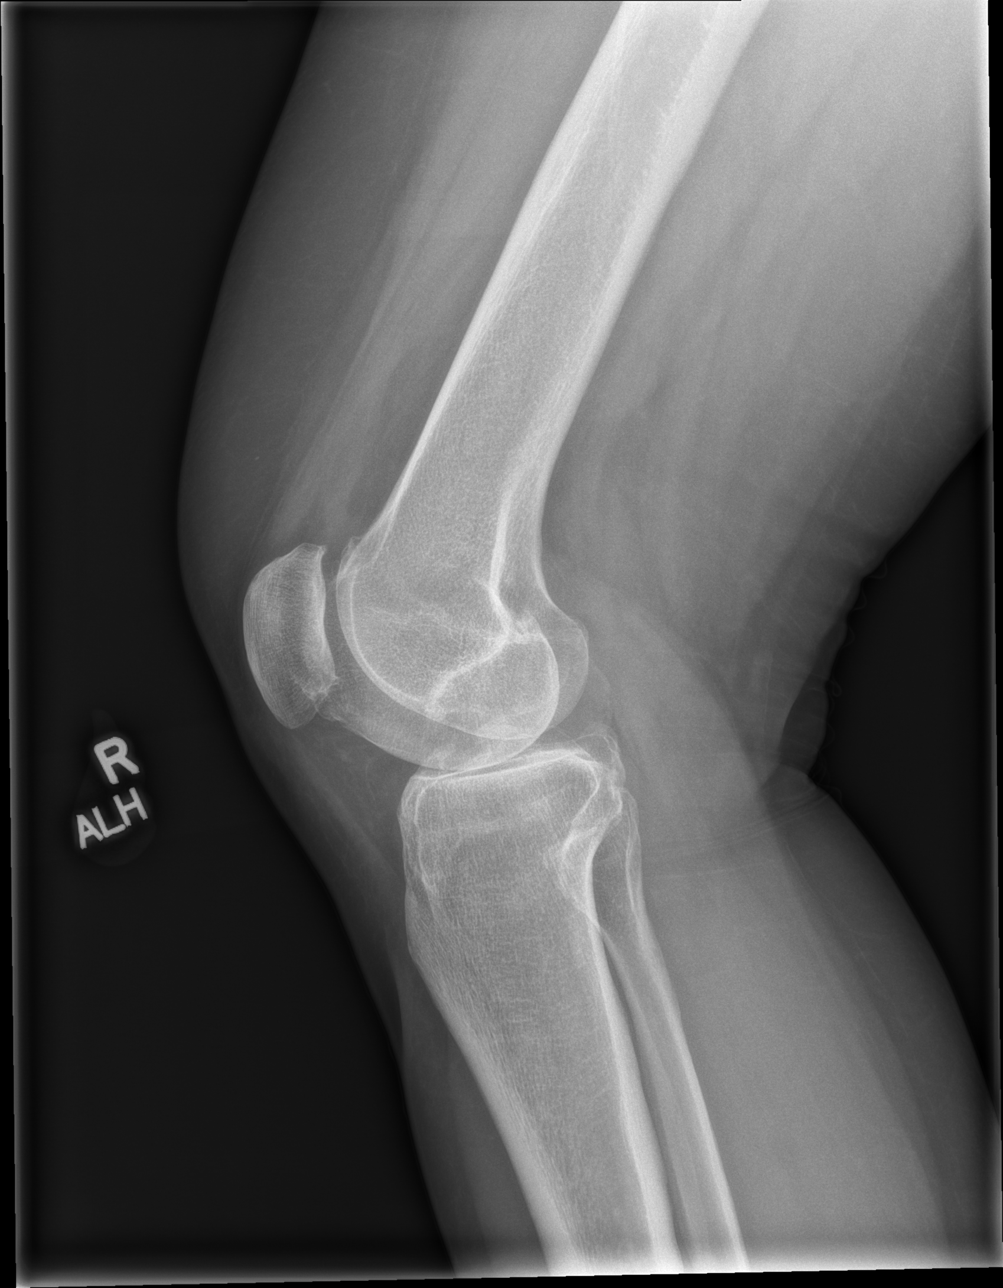

[x knee sunrise right]
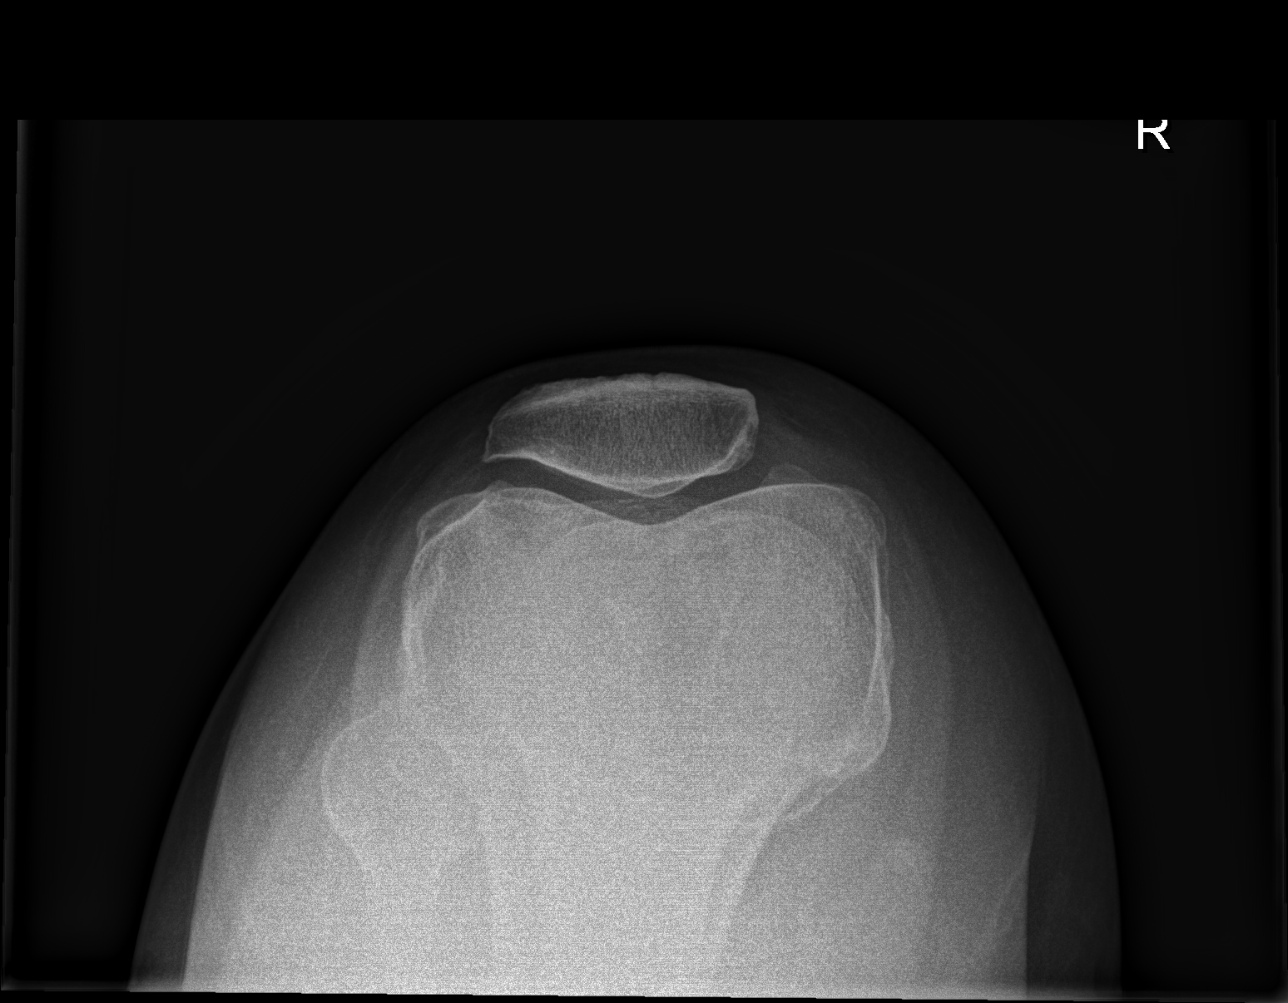

[4 of 4 positions shown; findings below may reference images not displayed]

FINDINGS: There is no acute fracture or dislocation. Knee alignment is normal.
There is moderate medial and lateral compartment joint space
narrowing with associated subchondral sclerosis and osteophytosis.
There is mild patellofemoral joint space narrowing. There is a trace
suprapatellar effusion.
IMPRESSION: 1. No acute fracture or dislocation.
2. Tricompartmental joint space narrowing with associated
osteophytosis primarily affecting the medial and lateral
compartments. Trace suprapatellar effusion.

## 2023-10-24 ENCOUNTER — Other Ambulatory Visit: Payer: Self-pay | Admitting: Family

## 2023-10-24 DIAGNOSIS — D6800 Von Willebrand disease, unspecified: Secondary | ICD-10-CM

## 2023-10-27 ENCOUNTER — Inpatient Hospital Stay: Attending: Hematology & Oncology

## 2023-10-27 ENCOUNTER — Inpatient Hospital Stay: Admitting: Family

## 2023-10-27 DIAGNOSIS — Z79899 Other long term (current) drug therapy: Secondary | ICD-10-CM | POA: Diagnosis not present

## 2023-10-27 DIAGNOSIS — D6800 Von Willebrand disease, unspecified: Secondary | ICD-10-CM | POA: Diagnosis present

## 2023-10-27 DIAGNOSIS — E278 Other specified disorders of adrenal gland: Secondary | ICD-10-CM | POA: Diagnosis not present

## 2023-10-27 DIAGNOSIS — Z886 Allergy status to analgesic agent status: Secondary | ICD-10-CM | POA: Insufficient documentation

## 2023-10-27 DIAGNOSIS — N920 Excessive and frequent menstruation with regular cycle: Secondary | ICD-10-CM | POA: Diagnosis not present

## 2023-10-27 DIAGNOSIS — Z888 Allergy status to other drugs, medicaments and biological substances status: Secondary | ICD-10-CM | POA: Insufficient documentation

## 2023-10-27 DIAGNOSIS — Z885 Allergy status to narcotic agent status: Secondary | ICD-10-CM | POA: Insufficient documentation

## 2023-10-27 DIAGNOSIS — R002 Palpitations: Secondary | ICD-10-CM | POA: Diagnosis not present

## 2023-10-27 LAB — APTT: aPTT: 29 s (ref 24–36)

## 2023-10-27 LAB — PROTIME-INR
INR: 0.9 (ref 0.8–1.2)
Prothrombin Time: 12.1 s (ref 11.4–15.2)

## 2023-10-27 NOTE — Progress Notes (Signed)
 Hematology and Oncology Follow Up Visit  Cheryl Tate [id number] 12-22-1970 53 y.o. 10/27/2023   Principle Diagnosis:  Von Willebrand's  Menorrhagia    Current Therapy:        Observation   Interim History:  Cheryl Tate is here today for follow-up prior to having an EGD and colonoscopy on 12/10/2023.  She was in the ED on 10/15/2023 with burning abdominal pain, nausea, coughing up blood, headache and decreased appetite. CT scan was negative.  She has been referred to GI for the above procedure for further evaluation.  She has not had any other bleeding episodes.  No abnormal bruising, no petechiae.  She has had 3 colonoscopies in the past while living in [place] and states that she was not treated with VWB factor prior to any of them and had no bleeding complications.  In the past she has not wanted VWB factor replacement so we have held off. She did not received with her surgeries on the right knee, both in 2023.  No fever, chills, cough, rash, dizziness, SOB, chest pain, abdominal pain or changes in bowel or bladder habits.  She has occasional palpitations with caffeine use.  No swelling, tenderness, numbness or tingling in her extremities.  No falls or syncope reported.  Appetite and hydration are good. Weight is stable at 154 lbs.   ECOG Performance Status: 0 - Asymptomatic  Medications:  Allergies as of 10/27/2023       Reactions   Bee Venom Swelling   Dilaudid  [hydromorphone ] Nausea And Vomiting   Nsaids    Avoid due to von willebrand disease    Tramadol     Migraines and Hallucinations   Benadryl [diphenhydramine] Palpitations        Medication List        Accurate as of October 27, 2023 11:28 AM. If you have any questions, ask your nurse or doctor.          acetaminophen  500 MG tablet Commonly known as: TYLENOL  Take 2 tablets (1,000 mg total) by mouth every 8 (eight) hours as needed for moderate pain.   B12 Folate 800-800 MCG Caps Take 1  tablet by mouth daily.   CALCIUM PO Take 1 tablet by mouth daily. Raw calcium with other vitamins   [employer] TRIPLE MAGNESIUM COMPLEX PO Take 1 tablet by mouth at bedtime.   EPINEPHrine  0.3 mg/0.3 mL Soaj injection Commonly known as: EPI-PEN Inject 0.3 mg into the muscle as needed for anaphylaxis.   fluticasone  50 MCG/ACT nasal spray Commonly known as: FLONASE  Place 1 spray into both nostrils daily as needed for allergies or rhinitis.   gabapentin  300 MG capsule Commonly known as: NEURONTIN  Take 1 capsule (300 mg total) by mouth 3 (three) times daily.   GLUCOSAMINE 1500 COMPLEX PO Take 1,500 mg by mouth daily.   IRON PO Take 1 tablet by mouth daily. Hemangenics Iron   Ketoprofen Powd 80 mg by Does not apply route as needed.   levocetirizine 5 MG tablet Commonly known as: XYZAL Take 5 mg by mouth daily as needed for allergies.   methocarbamol  500 MG tablet Commonly known as: ROBAXIN  Take 1 tablet (500 mg total) by mouth every 8 (eight) hours as needed for muscle spasms.   oxyCODONE  5 MG immediate release tablet Commonly known as: Roxicodone  Take 1 tablet (5 mg total) by mouth every 4 (four) hours as needed for severe pain.   valproic  acid 250 MG capsule Commonly known as: DEPAKENE  Take 250 mg by mouth at bedtime.  VISINE-A OP Place 1 drop into both eyes daily as needed (allergies).   vitamin C 1000 MG tablet Take 1,000 mg by mouth daily.   Vitamin D3 125 MCG (5000 UT) Caps TAKE ONE CAPSULE BY MOUTH DAILY        Allergies:  Allergies  Allergen Reactions   Bee Venom Swelling   Dilaudid  [Hydromorphone ] Nausea And Vomiting   Nsaids     Avoid due to von willebrand disease    Tramadol      Migraines and Hallucinations   Benadryl [Diphenhydramine] Palpitations    Past Medical History, Surgical history, Social history, and Family History were reviewed and updated.  Review of Systems: All other 10 point review of systems is negative.   Physical Exam:   height is 5' 2 (1.575 m) and weight is 154 lb 8 oz (70.1 kg). Her oral temperature is 98.7 F (37.1 C). Her blood pressure is 131/74 and her pulse is 60. Her respiration is 17 and oxygen saturation is 99%.   Wt Readings from Last 3 Encounters:  10/27/23 154 lb 8 oz (70.1 kg)  07/12/21 156 lb 1.4 oz (70.8 kg)  05/22/21 168 lb (76.2 kg)    Ocular: Sclerae unicteric, pupils equal, round and reactive to light Ear-nose-throat: Oropharynx clear, dentition fair Lymphatic: No cervical or supraclavicular adenopathy Lungs no rales or rhonchi, good excursion bilaterally Heart regular rate and rhythm, no murmur appreciated Abd soft, nontender, positive bowel sounds MSK no focal spinal tenderness, no joint edema Neuro: non-focal, well-oriented, appropriate affect Breasts: Deferred    Lab Results  Component Value Date   WBC 6.5 09/17/2021   HGB 11.3 (L) 09/17/2021   HCT 35.4 (L) 09/17/2021   MCV 91.5 09/17/2021   PLT 284 09/17/2021   Lab Results  Component Value Date   IRON 113 06/11/2014   TIBC 396 06/11/2014   UIBC 283 06/11/2014   IRONPCTSAT 29 06/11/2014   Lab Results  Component Value Date   RBC 3.87 09/17/2021   No results found for: KPAFRELGTCHN, LAMBDASER, KAPLAMBRATIO No results found for: IGGSERUM, IGA, IGMSERUM No results found for: STEPHANY CARLOTA BENSON MARKEL EARLA JOANNIE DOC VICK, SPEI   Chemistry      Component Value Date/Time   NA 139 09/17/2021 1418   K 3.9 09/17/2021 1418   CL 105 09/17/2021 1418   CO2 28 09/17/2021 1418   BUN 13 09/17/2021 1418   CREATININE 0.50 09/17/2021 1418   CREATININE 0.65 05/14/2021 0825   CREATININE 0.44 (L) 12/28/2014 1034      Component Value Date/Time   CALCIUM 8.9 09/17/2021 1418   ALKPHOS 52 09/17/2021 1418   AST 15 09/17/2021 1418   AST 14 (L) 05/14/2021 0825   ALT 13 09/17/2021 1418   ALT 13 05/14/2021 0825   BILITOT 0.4 09/17/2021 1418   BILITOT 0.2 (L) 05/14/2021 0825        Impression and Plan:  Cheryl Tate is a very pleasant 53 yo white female with history of Von Willebrand's and menorrhagia.  [facility] panel pending.  PT/INR and PTT are within normal limits.  Follow-up and treatment plan pending results.  We will continue to follow-up as needed.   Lauraine Pepper, NP 10/6/202511:28 AM

## 2023-10-30 LAB — VON WILLEBRAND PANEL
Coagulation Factor VIII: 25 % — ABNORMAL LOW (ref 56–140)
Ristocetin Co-factor, Plasma: 46 % — ABNORMAL LOW (ref 50–200)
Von Willebrand Antigen, Plasma: 53 % (ref 50–200)

## 2023-10-30 LAB — COAG STUDIES INTERP REPORT

## 2023-10-31 ENCOUNTER — Encounter: Payer: Self-pay | Admitting: Family

## 2023-11-24 ENCOUNTER — Encounter: Payer: Self-pay | Admitting: Radiology

## 2023-12-09 ENCOUNTER — Other Ambulatory Visit: Payer: Self-pay | Admitting: Family Medicine

## 2023-12-09 DIAGNOSIS — M25571 Pain in right ankle and joints of right foot: Secondary | ICD-10-CM

## 2023-12-09 DIAGNOSIS — M79671 Pain in right foot: Secondary | ICD-10-CM
